# Patient Record
Sex: Female | Born: 1960 | Race: White | Hispanic: No | Marital: Married | Smoking: Never smoker
Health system: Southern US, Community
[De-identification: ages and names within clinical notes are randomized; demographics above are authoritative.]

## PROBLEM LIST (undated history)

## (undated) DIAGNOSIS — K219 Gastro-esophageal reflux disease without esophagitis: Secondary | ICD-10-CM

## (undated) DIAGNOSIS — Z87442 Personal history of urinary calculi: Secondary | ICD-10-CM

## (undated) DIAGNOSIS — R32 Unspecified urinary incontinence: Secondary | ICD-10-CM

## (undated) DIAGNOSIS — C4490 Unspecified malignant neoplasm of skin, unspecified: Secondary | ICD-10-CM

## (undated) DIAGNOSIS — Z86010 Personal history of colonic polyps: Secondary | ICD-10-CM

## (undated) DIAGNOSIS — I959 Hypotension, unspecified: Secondary | ICD-10-CM

## (undated) DIAGNOSIS — Z9889 Other specified postprocedural states: Secondary | ICD-10-CM

## (undated) DIAGNOSIS — Z8719 Personal history of other diseases of the digestive system: Secondary | ICD-10-CM

## (undated) DIAGNOSIS — M8580 Other specified disorders of bone density and structure, unspecified site: Secondary | ICD-10-CM

## (undated) DIAGNOSIS — R112 Nausea with vomiting, unspecified: Secondary | ICD-10-CM

## (undated) DIAGNOSIS — M810 Age-related osteoporosis without current pathological fracture: Secondary | ICD-10-CM

## (undated) DIAGNOSIS — M1990 Unspecified osteoarthritis, unspecified site: Secondary | ICD-10-CM

## (undated) DIAGNOSIS — G43909 Migraine, unspecified, not intractable, without status migrainosus: Secondary | ICD-10-CM

## (undated) HISTORY — PX: OTHER SURGICAL HISTORY: SHX169

## (undated) HISTORY — PX: TUBAL LIGATION: SHX77

## (undated) HISTORY — DX: Gastro-esophageal reflux disease without esophagitis: K21.9

## (undated) HISTORY — DX: Age-related osteoporosis without current pathological fracture: M81.0

## (undated) HISTORY — PX: BREAST SURGERY: SHX581

## (undated) HISTORY — DX: Personal history of urinary calculi: Z87.442

## (undated) HISTORY — PX: BREAST EXCISIONAL BIOPSY: SUR124

## (undated) HISTORY — DX: Unspecified urinary incontinence: R32

## (undated) HISTORY — DX: Other specified disorders of bone density and structure, unspecified site: M85.80

## (undated) HISTORY — PX: SEPTOPLASTY: SUR1290

## (undated) HISTORY — DX: Personal history of colonic polyps: Z86.010

## (undated) HISTORY — PX: COLONOSCOPY: SHX174

## (undated) HISTORY — DX: Migraine, unspecified, not intractable, without status migrainosus: G43.909

---

## 1998-06-27 ENCOUNTER — Ambulatory Visit (HOSPITAL_COMMUNITY): Admission: RE | Admit: 1998-06-27 | Discharge: 1998-06-27 | Payer: Self-pay | Admitting: Obstetrics and Gynecology

## 1998-08-26 ENCOUNTER — Ambulatory Visit (HOSPITAL_COMMUNITY): Admission: RE | Admit: 1998-08-26 | Discharge: 1998-08-26 | Payer: Self-pay | Admitting: *Deleted

## 1998-09-06 ENCOUNTER — Ambulatory Visit (HOSPITAL_COMMUNITY): Admission: RE | Admit: 1998-09-06 | Discharge: 1998-09-06 | Payer: Self-pay | Admitting: *Deleted

## 1998-11-29 ENCOUNTER — Other Ambulatory Visit: Admission: RE | Admit: 1998-11-29 | Discharge: 1998-11-29 | Payer: Self-pay | Admitting: Obstetrics and Gynecology

## 1999-07-05 ENCOUNTER — Encounter: Payer: Self-pay | Admitting: Obstetrics and Gynecology

## 1999-07-05 ENCOUNTER — Ambulatory Visit (HOSPITAL_COMMUNITY): Admission: RE | Admit: 1999-07-05 | Discharge: 1999-07-05 | Payer: Self-pay | Admitting: Obstetrics and Gynecology

## 1999-12-05 ENCOUNTER — Other Ambulatory Visit: Admission: RE | Admit: 1999-12-05 | Discharge: 1999-12-05 | Payer: Self-pay | Admitting: Obstetrics and Gynecology

## 2000-02-19 ENCOUNTER — Emergency Department (HOSPITAL_COMMUNITY): Admission: EM | Admit: 2000-02-19 | Discharge: 2000-02-19 | Payer: Self-pay | Admitting: Emergency Medicine

## 2000-02-20 ENCOUNTER — Encounter: Payer: Self-pay | Admitting: Emergency Medicine

## 2000-02-28 ENCOUNTER — Ambulatory Visit (HOSPITAL_COMMUNITY): Admission: RE | Admit: 2000-02-28 | Discharge: 2000-02-28 | Payer: Self-pay | Admitting: Internal Medicine

## 2000-02-28 ENCOUNTER — Encounter: Payer: Self-pay | Admitting: Internal Medicine

## 2000-03-14 ENCOUNTER — Ambulatory Visit (HOSPITAL_COMMUNITY): Admission: RE | Admit: 2000-03-14 | Discharge: 2000-03-14 | Payer: Self-pay | Admitting: Internal Medicine

## 2000-04-17 ENCOUNTER — Ambulatory Visit (HOSPITAL_COMMUNITY): Admission: RE | Admit: 2000-04-17 | Discharge: 2000-04-17 | Payer: Self-pay | Admitting: Internal Medicine

## 2000-07-08 ENCOUNTER — Ambulatory Visit (HOSPITAL_COMMUNITY): Admission: RE | Admit: 2000-07-08 | Discharge: 2000-07-08 | Payer: Self-pay | Admitting: Obstetrics and Gynecology

## 2000-07-08 ENCOUNTER — Encounter: Payer: Self-pay | Admitting: Obstetrics and Gynecology

## 2001-01-21 ENCOUNTER — Other Ambulatory Visit: Admission: RE | Admit: 2001-01-21 | Discharge: 2001-01-21 | Payer: Self-pay | Admitting: Obstetrics and Gynecology

## 2001-07-09 ENCOUNTER — Encounter: Payer: Self-pay | Admitting: Obstetrics and Gynecology

## 2001-07-09 ENCOUNTER — Ambulatory Visit (HOSPITAL_COMMUNITY): Admission: RE | Admit: 2001-07-09 | Discharge: 2001-07-09 | Payer: Self-pay | Admitting: Obstetrics and Gynecology

## 2002-02-17 ENCOUNTER — Other Ambulatory Visit: Admission: RE | Admit: 2002-02-17 | Discharge: 2002-02-17 | Payer: Self-pay | Admitting: Obstetrics and Gynecology

## 2002-05-08 ENCOUNTER — Ambulatory Visit (HOSPITAL_COMMUNITY): Admission: RE | Admit: 2002-05-08 | Discharge: 2002-05-08 | Payer: Self-pay | Admitting: Obstetrics and Gynecology

## 2002-05-08 ENCOUNTER — Encounter (INDEPENDENT_AMBULATORY_CARE_PROVIDER_SITE_OTHER): Payer: Self-pay

## 2002-07-27 ENCOUNTER — Encounter: Admission: RE | Admit: 2002-07-27 | Discharge: 2002-07-27 | Payer: Self-pay | Admitting: Obstetrics and Gynecology

## 2002-07-27 ENCOUNTER — Encounter: Payer: Self-pay | Admitting: Obstetrics and Gynecology

## 2002-11-05 ENCOUNTER — Encounter (INDEPENDENT_AMBULATORY_CARE_PROVIDER_SITE_OTHER): Payer: Self-pay | Admitting: Specialist

## 2002-11-05 ENCOUNTER — Ambulatory Visit (HOSPITAL_BASED_OUTPATIENT_CLINIC_OR_DEPARTMENT_OTHER): Admission: RE | Admit: 2002-11-05 | Discharge: 2002-11-05 | Payer: Self-pay | Admitting: Plastic Surgery

## 2002-11-18 ENCOUNTER — Ambulatory Visit (HOSPITAL_BASED_OUTPATIENT_CLINIC_OR_DEPARTMENT_OTHER): Admission: RE | Admit: 2002-11-18 | Discharge: 2002-11-18 | Payer: Self-pay | Admitting: Plastic Surgery

## 2002-11-18 ENCOUNTER — Encounter (INDEPENDENT_AMBULATORY_CARE_PROVIDER_SITE_OTHER): Payer: Self-pay | Admitting: Plastic Surgery

## 2003-01-01 ENCOUNTER — Encounter (INDEPENDENT_AMBULATORY_CARE_PROVIDER_SITE_OTHER): Payer: Self-pay | Admitting: *Deleted

## 2003-01-01 ENCOUNTER — Ambulatory Visit (HOSPITAL_BASED_OUTPATIENT_CLINIC_OR_DEPARTMENT_OTHER): Admission: RE | Admit: 2003-01-01 | Discharge: 2003-01-01 | Payer: Self-pay | Admitting: Plastic Surgery

## 2003-03-05 ENCOUNTER — Ambulatory Visit (HOSPITAL_BASED_OUTPATIENT_CLINIC_OR_DEPARTMENT_OTHER): Admission: RE | Admit: 2003-03-05 | Discharge: 2003-03-05 | Payer: Self-pay | Admitting: Plastic Surgery

## 2003-03-05 ENCOUNTER — Encounter (INDEPENDENT_AMBULATORY_CARE_PROVIDER_SITE_OTHER): Payer: Self-pay | Admitting: Specialist

## 2003-03-05 ENCOUNTER — Ambulatory Visit (HOSPITAL_COMMUNITY): Admission: RE | Admit: 2003-03-05 | Discharge: 2003-03-05 | Payer: Self-pay | Admitting: Plastic Surgery

## 2003-04-09 ENCOUNTER — Other Ambulatory Visit: Admission: RE | Admit: 2003-04-09 | Discharge: 2003-04-09 | Payer: Self-pay | Admitting: Obstetrics and Gynecology

## 2003-08-02 ENCOUNTER — Ambulatory Visit (HOSPITAL_COMMUNITY): Admission: RE | Admit: 2003-08-02 | Discharge: 2003-08-02 | Payer: Self-pay | Admitting: Obstetrics and Gynecology

## 2004-01-07 ENCOUNTER — Ambulatory Visit (HOSPITAL_COMMUNITY): Admission: RE | Admit: 2004-01-07 | Discharge: 2004-01-07 | Payer: Self-pay | Admitting: Family Medicine

## 2004-06-04 DIAGNOSIS — Z860100 Personal history of colon polyps, unspecified: Secondary | ICD-10-CM

## 2004-06-04 HISTORY — PX: ABDOMINAL HYSTERECTOMY: SHX81

## 2004-06-04 HISTORY — DX: Personal history of colon polyps, unspecified: Z86.0100

## 2004-06-04 HISTORY — DX: Personal history of colonic polyps: Z86.010

## 2004-06-04 HISTORY — PX: VAGINAL HYSTERECTOMY: SUR661

## 2004-06-19 ENCOUNTER — Other Ambulatory Visit: Admission: RE | Admit: 2004-06-19 | Discharge: 2004-06-19 | Payer: Self-pay | Admitting: Obstetrics and Gynecology

## 2004-08-02 ENCOUNTER — Ambulatory Visit (HOSPITAL_COMMUNITY): Admission: RE | Admit: 2004-08-02 | Discharge: 2004-08-02 | Payer: Self-pay | Admitting: Obstetrics and Gynecology

## 2004-10-09 ENCOUNTER — Ambulatory Visit (HOSPITAL_COMMUNITY): Admission: RE | Admit: 2004-10-09 | Discharge: 2004-10-09 | Payer: Self-pay | Admitting: Gastroenterology

## 2004-10-09 ENCOUNTER — Encounter (INDEPENDENT_AMBULATORY_CARE_PROVIDER_SITE_OTHER): Payer: Self-pay | Admitting: *Deleted

## 2005-07-10 ENCOUNTER — Other Ambulatory Visit: Admission: RE | Admit: 2005-07-10 | Discharge: 2005-07-10 | Payer: Self-pay | Admitting: Obstetrics and Gynecology

## 2005-08-07 ENCOUNTER — Ambulatory Visit (HOSPITAL_COMMUNITY): Admission: RE | Admit: 2005-08-07 | Discharge: 2005-08-07 | Payer: Self-pay | Admitting: Obstetrics and Gynecology

## 2005-08-23 ENCOUNTER — Ambulatory Visit (HOSPITAL_COMMUNITY): Admission: RE | Admit: 2005-08-23 | Discharge: 2005-08-23 | Payer: Self-pay | Admitting: Obstetrics and Gynecology

## 2006-08-12 ENCOUNTER — Ambulatory Visit (HOSPITAL_COMMUNITY): Admission: RE | Admit: 2006-08-12 | Discharge: 2006-08-12 | Payer: Self-pay | Admitting: Obstetrics and Gynecology

## 2006-08-15 ENCOUNTER — Ambulatory Visit: Payer: Self-pay | Admitting: Internal Medicine

## 2006-08-19 ENCOUNTER — Emergency Department (HOSPITAL_COMMUNITY): Admission: EM | Admit: 2006-08-19 | Discharge: 2006-08-19 | Payer: Self-pay | Admitting: Emergency Medicine

## 2006-09-10 ENCOUNTER — Ambulatory Visit (HOSPITAL_COMMUNITY): Admission: RE | Admit: 2006-09-10 | Discharge: 2006-09-10 | Payer: Self-pay | Admitting: Obstetrics and Gynecology

## 2006-09-10 ENCOUNTER — Encounter: Payer: Self-pay | Admitting: Vascular Surgery

## 2006-09-10 ENCOUNTER — Ambulatory Visit: Payer: Self-pay | Admitting: Vascular Surgery

## 2007-05-20 ENCOUNTER — Ambulatory Visit (HOSPITAL_COMMUNITY): Admission: RE | Admit: 2007-05-20 | Discharge: 2007-05-20 | Payer: Self-pay | Admitting: Obstetrics and Gynecology

## 2007-08-19 ENCOUNTER — Ambulatory Visit (HOSPITAL_COMMUNITY): Admission: RE | Admit: 2007-08-19 | Discharge: 2007-08-19 | Payer: Self-pay | Admitting: Obstetrics and Gynecology

## 2008-06-23 ENCOUNTER — Encounter: Payer: Self-pay | Admitting: Internal Medicine

## 2008-08-19 ENCOUNTER — Ambulatory Visit (HOSPITAL_COMMUNITY): Admission: RE | Admit: 2008-08-19 | Discharge: 2008-08-19 | Payer: Self-pay | Admitting: Obstetrics and Gynecology

## 2009-02-17 ENCOUNTER — Emergency Department (HOSPITAL_COMMUNITY): Admission: EM | Admit: 2009-02-17 | Discharge: 2009-02-17 | Payer: Self-pay | Admitting: Emergency Medicine

## 2009-07-14 ENCOUNTER — Encounter: Payer: Self-pay | Admitting: Internal Medicine

## 2009-08-30 ENCOUNTER — Ambulatory Visit (HOSPITAL_COMMUNITY): Admission: RE | Admit: 2009-08-30 | Discharge: 2009-08-30 | Payer: Self-pay | Admitting: Obstetrics and Gynecology

## 2009-11-04 ENCOUNTER — Ambulatory Visit (HOSPITAL_COMMUNITY): Admission: RE | Admit: 2009-11-04 | Discharge: 2009-11-04 | Payer: Self-pay | Admitting: Obstetrics and Gynecology

## 2009-11-15 ENCOUNTER — Encounter: Admission: RE | Admit: 2009-11-15 | Discharge: 2009-11-15 | Payer: Self-pay | Admitting: Obstetrics and Gynecology

## 2009-12-19 ENCOUNTER — Ambulatory Visit (HOSPITAL_COMMUNITY): Admission: RE | Admit: 2009-12-19 | Discharge: 2009-12-19 | Payer: Self-pay | Admitting: Gastroenterology

## 2010-02-27 ENCOUNTER — Encounter: Admission: RE | Admit: 2010-02-27 | Discharge: 2010-02-27 | Payer: Self-pay | Admitting: Obstetrics and Gynecology

## 2010-03-31 LAB — BASIC METABOLIC PANEL
Creatinine: 0.8 mg/dL (ref 0.5–1.1)
Glucose: 67 mg/dL
Potassium: 3.9 mmol/L (ref 3.4–5.3)

## 2010-03-31 LAB — CBC AND DIFFERENTIAL: WBC: 5.3 10^3/mL

## 2010-03-31 LAB — HEPATIC FUNCTION PANEL
ALT: 24 U/L (ref 7–35)
AST: 19 U/L (ref 13–35)

## 2010-03-31 LAB — LIPID PANEL: HDL: 81 mg/dL — AB (ref 35–70)

## 2010-05-23 ENCOUNTER — Ambulatory Visit (HOSPITAL_COMMUNITY)
Admission: RE | Admit: 2010-05-23 | Discharge: 2010-05-23 | Payer: Self-pay | Source: Home / Self Care | Attending: Obstetrics and Gynecology | Admitting: Obstetrics and Gynecology

## 2010-07-04 NOTE — Letter (Signed)
Summary: [facility]  [facility]   Imported By: Lanelle Bal 07/27/2009 11:08:50  _____________________________________________________________________  External Attachment:    Type:   Image     Comment:   External Document

## 2010-08-08 ENCOUNTER — Other Ambulatory Visit (HOSPITAL_COMMUNITY): Payer: Self-pay | Admitting: Orthopedic Surgery

## 2010-08-08 ENCOUNTER — Ambulatory Visit (HOSPITAL_COMMUNITY)
Admission: RE | Admit: 2010-08-08 | Discharge: 2010-08-08 | Disposition: A | Discharge status: Home / Self Care | Payer: 59 | Source: Ambulatory Visit | Attending: Orthopedic Surgery | Admitting: Orthopedic Surgery

## 2010-08-08 DIAGNOSIS — M549 Dorsalgia, unspecified: Secondary | ICD-10-CM

## 2010-08-08 DIAGNOSIS — M545 Low back pain, unspecified: Secondary | ICD-10-CM | POA: Insufficient documentation

## 2010-08-15 ENCOUNTER — Other Ambulatory Visit (HOSPITAL_COMMUNITY): Payer: Self-pay | Admitting: Obstetrics and Gynecology

## 2010-08-15 DIAGNOSIS — Z1231 Encounter for screening mammogram for malignant neoplasm of breast: Secondary | ICD-10-CM

## 2010-08-15 DIAGNOSIS — Q782 Osteopetrosis: Secondary | ICD-10-CM

## 2010-08-31 ENCOUNTER — Ambulatory Visit (HOSPITAL_COMMUNITY)
Admission: RE | Admit: 2010-08-31 | Discharge: 2010-08-31 | Disposition: A | Discharge status: Home / Self Care | Payer: 59 | Source: Ambulatory Visit | Attending: Obstetrics and Gynecology | Admitting: Obstetrics and Gynecology

## 2010-08-31 DIAGNOSIS — Z13820 Encounter for screening for osteoporosis: Secondary | ICD-10-CM | POA: Insufficient documentation

## 2010-08-31 DIAGNOSIS — Z1231 Encounter for screening mammogram for malignant neoplasm of breast: Secondary | ICD-10-CM

## 2010-08-31 DIAGNOSIS — M899 Disorder of bone, unspecified: Secondary | ICD-10-CM | POA: Insufficient documentation

## 2010-10-16 ENCOUNTER — Encounter: Payer: Self-pay | Admitting: Internal Medicine

## 2010-10-16 ENCOUNTER — Ambulatory Visit (INDEPENDENT_AMBULATORY_CARE_PROVIDER_SITE_OTHER): Payer: 59 | Admitting: Internal Medicine

## 2010-10-16 VITALS — BP 110/78 | HR 73 | Temp 98.3°F | Wt 111.4 lb

## 2010-10-16 DIAGNOSIS — M25562 Pain in left knee: Secondary | ICD-10-CM

## 2010-10-16 DIAGNOSIS — M25569 Pain in unspecified knee: Secondary | ICD-10-CM

## 2010-10-16 DIAGNOSIS — M25561 Pain in right knee: Secondary | ICD-10-CM

## 2010-10-16 NOTE — Patient Instructions (Signed)
Assess response to Glucosamine sulfate 1500 mg daily & avoiding stair climbing.

## 2010-10-16 NOTE — Progress Notes (Signed)
  Subjective:    Patient ID: Katherine Vaughan, female    DOB: 1961/01/20, 50 y.o.   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  Deep tendon reflexes are equal. Strength is excellent in upper and lower extremities.  Pedal pulses are intact.  Gait is normal including toe and heel walking. She has no significant skeletal abnormalities of the hands.  There is no definite effusion of the knees. Range of motion of the lower extremities is normal. She has no popliteal area tenderness.  Straight leg raising is negative. Homans sign is negative.       Assessment/plan: Her knee symptoms are suggestive of an arthritic condition rather than a neurologic picture related to her documented spinal stenosis. The symptoms are most likely aggravated by walking she doesn't work climbing flights of stairs repeatedly.  I've recommended glucosamine sulfate 1500  mg 1-2 times daily for the next several weeks & avoiding the stairs. If symptoms fail to resolve I would recommend an assessment by an Orthopedic  knee specialists.

## 2010-10-20 NOTE — H&P (Signed)
NAME:  Katherine Vaughan, Katherine Vaughan                 ACCOUNT NO.:  [id number]   MEDICAL RECORD NO.:  [id number]          PATIENT TYPE:  AMB   LOCATION:  SDC                           FACILITY:  [facility]   PHYSICIAN:  Juluis Mire, M.D.   DATE OF BIRTH:  December 12, 1960   DATE OF ADMISSION:  DATE OF DISCHARGE:                                HISTORY & PHYSICAL   HISTORY OF PRESENT ILLNESS:  The patient is a 50 year old nulligravid  married female who presents for a laparoscopy and evaluation of the vaginal  cuff.   In relation to the present admission, the patient's history from a  gynecological standpoint is significant in that she had a vaginal  hysterectomy in 1996 for management of symptomatic pelvic endometriosis.  Subsequently she developed insertional dyspareunia and due to the findings  of vestibular adenitis underwent a perineoplasty in 2004; she had good  results with that. At the present time, however, she has increasing pain  mainly during arousal. She describes this as an aching discomfort in the  pelvic area at the top of the vagina. This is a very deep discomfort. There  is no pain with intercourse just prior to intercourse. She has been  evaluated by a urologist for interstitial cystitis with negative findings.  She has undergone pelvic [facility] at [facility] without response. In view of this, we are going to proceed with  laparoscopic evaluation to look for any evidence of pelvic pathology and try  to inject the cuff with a combination of steroids and Marcaine to see if we  can get some relief of her discomfort.   ALLERGIES:  In terms of allergies she is allergic to DEMEROL and PERCOCET.   MEDICATIONS:  Nexium, ampicillin and Fosamax.   PAST MEDICAL HISTORY:  She has the usual childhood diseases. No significant  sequelae.   PAST SURGICAL HISTORY:  1.  In 1996 she had diagnostic laparoscopy and bilateral tubal ligation,      treatment of endometriosis.  Subsequently in 1996 she had a total vaginal      hysterectomy.  2.  In 2004 she had a perineoplasty.  3.  She has also had a rhinoplasty.   FAMILY HISTORY:  Noncontributory.   SOCIAL HISTORY:  Reveals no tobacco or alcohol use.   REVIEW OF SYSTEMS:  Noncontributory.   PHYSICAL EXAMINATION:  VITAL SIGNS:  The patient is afebrile with stable  vital signs.  HEENT EXAM:  The patient is normocephalic. Pupils equal, round and reactive  to light and accommodation. Extraocular movements were intact. Sclerae and  conjunctivae were clear. Oropharynx clear.  NECK:  Without thyromegaly.  BREASTS:  No discrete masses.  LUNGS:  Clear.  CARDIAC SYSTEM:  Regular rhythm and rate without murmurs or gallops.  ABDOMINAL EXAM:  Benign; no masses, organomegaly or tenderness.  PELVIC:  Normal external genitalia. Vaginal mucosa clear. Cuff intact. She  has good support and no tenderness noted or masses.  EXTREMITIES:  Trace edema.  NEUROLOGIC EXAM:  Grossly within normal limits.   IMPRESSION:  Pelvic pain with sexual activity,  rule out pelvic pathology.   PLAN:  The patient will undergo diagnostic laparoscopy for evaluation of the  vaginal cuff. Will try to inject the area with Marcaine and dexamethasone  and see if we can get resolution of her discomfort. The risks and benefits  of surgery have been discussed. We have obviously discussed that this may  not help. We have discussed the risks of infection, the risk of hemorrhage  that could require transfusion, the risk of AIDS or hepatitis, the risk of  injury to adjacent organs requiring exploratory surgery, the risk of deep  venous thrombosis and pulmonary embolus. The patient voiced understanding of  the indications and risks.      Juluis Mire, M.D.  Electronically Signed     JSM/MEDQ  D:  08/23/2005  T:  08/23/2005  Job:  [id number]

## 2010-10-20 NOTE — Op Note (Signed)
   NAME:  SORAIYA, AHNER NO.:  [id number]   MEDICAL RECORD NO.:  [id number]                   PATIENT TYPE:  AMB   LOCATION:  DSC                                  FACILITY:  [facility]   PHYSICIAN:  Etter Sjogren, M.D.                  DATE OF BIRTH:  03-27-1961   DATE OF PROCEDURE:  11/05/2002  DATE OF DISCHARGE:                                 OPERATIVE REPORT   PREOPERATIVE DIAGNOSIS:  Multiple lesions of undetermined behavior right  thumb, arm, shoulder, face, neck, left arm.   POSTOPERATIVE DIAGNOSIS:  Multiple lesions of undetermined behavior right  thumb, arm, shoulder, face, neck, left arm with wounds of the shoulder, arm  and face necessitating a layered closure.   PROCEDURE:  1. Excision of multiple lesions of undetermined behavior base of thumb,     forearm, right side of her arm bilateral, right shoulder, right cheek,     bilateral upper eyelids, left temple, and neck.  2. Intermediate wound closure of the extremity, greater than 2.5 cm.  3. Intermediate wound closure neck, greater than 1.0 cm.  4. Intermediate wound closure face, 2.5 cm or less.   SURGEON:  Etter Sjogren, M.D.   ANESTHESIA:  1% Xylocaine with epinephrine plus bicarbonate.   INDICATIONS:  The patient has multiple lesions that are pigmented, changing,  and it is medically necessary to remove these.  The risks were understood by  her including scarring, possibility of further surgery depending up on final  pathology report.  She understood all this and wished to proceed.   DESCRIPTION OF PROCEDURE:  The patient was placed supine on the operating  table.  She was prepped with Betadine and draped with sterile drapes.  The  elliptical incisions were planned.  Successful local anesthesia was  achieved.  The excision was performed.  Layered closures were necessary  where using 5-0 Monocryl interrupted inverted deep sutures and 6-0 simple  suture or 5-0 Prolene simple sutures.   Antibiotic ointment applied.  She  tolerated the procedure well.                                               Etter Sjogren, M.D.    DB/MEDQ  D:  11/05/2002  T:  11/06/2002  Job:  [id number]

## 2010-10-20 NOTE — H&P (Signed)
NAME:  Katherine Vaughan, Katherine Vaughan NO.:  [id number]   MEDICAL RECORD NO.:  [id number]                   PATIENT TYPE:  AMB   LOCATION:  SDC                                  FACILITY:  [facility]   PHYSICIAN:  Juluis Mire, M.D.                DATE OF BIRTH:  10/08/1960   DATE OF ADMISSION:  05/08/2002  DATE OF DISCHARGE:                                HISTORY & PHYSICAL   REASON FOR ADMISSION:  The patient is a 50 year old, nulligravida, married  white female presents for perineoplasty.   HISTORY OF PRESENT ILLNESS:  The patient has had a long-standing chronic  history of insertional dyspareunia.  Close evaluation has revealed evidence  of a vestibular adenitis.  She has been treated with topical agents  including Temovate as well as estrogen creams without significant response.  This had become an extremely limiting issue.  We had offered the option of  perineoplasty.  We had discussed the success rate of approximately 80%.  She  is admitted at the present time for this.   ALLERGIES:  SULFA.   MEDICATIONS:  1. Prilosec 20 mg twice a day.  2. Minocin 100 mg each day.  3. Ditropan 10 mg each day.   PAST MEDICAL HISTORY:  Usual childhood diseases without any significant  sequelae.   PAST SURGICAL HISTORY:  She had laparoscopic tubal ligation done in the past  with finding of pelvic endometriosis, subsequently had a total vaginal  hysterectomy for management of symptomatic pelvic endometriosis.   FAMILY HISTORY:  Noncontributory.   SOCIAL HISTORY:  No tobacco or alcohol use.   REVIEW OF SYSTEMS:  Noncontributory.   PHYSICAL EXAMINATION:  VITAL SIGNS:  Afebrile with stable vital signs.  HEENT:  The patient is normocephalic.  Pupils equal, round, reactive to  light and accommodation.  Extraocular movements intact.  Sclerae and  conjunctivae are clear.  Oropharynx clear.  NECK:  Without thyromegaly.  BREASTS:  No discrete masses.  LUNGS:  Clear.  CARDIOVASCULAR:  Regular rhythm and rate without murmurs or gallops.  ABDOMEN:  Benign, no masses, organomegaly, or tenderness.  PELVIC:  Does have classical findings of vestibular adenitis.  She has  pinpoint tenderness in the vestibular glands with erythematous changes.  This does not extend up to the periurethral area.  It seems to be located  mostly on the posterior vestibular area.  Vaginal mucosa is otherwise clear,  cuff intact.  Bimanual exam unremarkable.  Rectovaginal exam is clear.  EXTREMITIES:  Trace edema.  NEUROLOGICAL:  Grossly within normal limits.   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   PLAN:  The patient to undergo a perineoplasty.  The risks of surgery have  been discussed including the risks of infection.  The risk of hematoma or  bleeding problems that could require transfusion. The risk of injury to  adjacent organs.  Finally, we  have discussed that the potential of this may  not improve her condition and in fact may cause vaginal scarring leading to  worsening symptomatology.  The patient appears to understand the indications  and risks.                                               Juluis Mire, M.D.    JSM/MEDQ  D:  05/08/2002  T:  05/08/2002  Job:  [id number]

## 2010-10-20 NOTE — Op Note (Signed)
   NAME:  Katherine Vaughan, Katherine Vaughan                           ACCOUNT NO.:  [id number]   MEDICAL RECORD NO.:  [id number]                   PATIENT TYPE:  AMB   LOCATION:  SDC                                  FACILITY:  [facility]   PHYSICIAN:  Juluis Mire, M.D.                DATE OF BIRTH:  1960/12/25   DATE OF PROCEDURE:  05/08/2002  DATE OF DISCHARGE:                                 OPERATIVE REPORT   PREOPERATIVE DIAGNOSIS:  Vestibular adenitis.   POSTOPERATIVE DIAGNOSIS:  Vestibular adenitis.   PROCEDURE:  Perineoplasty.   ANESTHESIA:  General using the laryngeal mask.   ESTIMATED BLOOD LOSS:  100 cc.   PACKS AND DRAINS:  None.   BLOOD REPLACED:  None.   COMPLICATIONS:  None.   INDICATIONS:  As noted in the history and physical.   DESCRIPTION OF PROCEDURE:  The patient was taken to the OR and placed in the  supine position.  After an adequate level of general anesthesia obtained,  the patient was placed in the dorsal lithotomy position using Allen  stirrups.  The perineum and vagina were prepped out with Betadine and draped  in a sterile field.  Exam revealed erythematous areas on the posterior  vestibular area at the area of the vestibular glands.  We first using a  knife made an incision over the perineal body, and we extended this to the  vaginal mucosa side of the hymenal remnant, excising the perineal area,  including the vestibular glands.  This extended up to the periurethral area.  We slightly undermined the vagina.  We brought about hemostasis using the  Bovie.  We excised further some of the skin over the perineum.  We then  reconstructed the area with interrupted sutures of 3-0 Vicryl, completely  bringing the vaginal mucosa out to the perineal body.  We had good  hemostasis, good reapproximation.  Exam revealed again fairly good relief.  There were no constricting areas.  The patient was taken out of the lateral  decubitus position and once alert and extubated, transferred  to the recovery  room in good condition.  Sponge, instrument, and needle count reported as  correct by the circulating nurse.                                                Juluis Mire, M.D.    JSM/MEDQ  D:  05/08/2002  T:  05/08/2002  Job:  [id number]

## 2010-10-20 NOTE — Op Note (Signed)
NAME:  Katherine Vaughan, Katherine Vaughan                           ACCOUNT NO.:  [id number]   MEDICAL RECORD NO.:  [id number]                   PATIENT TYPE:  AMB   LOCATION:  DSC                                  FACILITY:  [facility]   PHYSICIAN:  Etter Sjogren, M.D.                  DATE OF BIRTH:  June 03, 1961   DATE OF PROCEDURE:  11/18/2002  DATE OF DISCHARGE:                                 OPERATIVE REPORT   PREOPERATIVE DIAGNOSES:  1. Lesion of undetermined behavior, base of thumb, possible melanoma in     situ, previously excised.  2. Suspicious lesion of undetermined behavior, left lower abdomen.  3. Lesion of undetermined behavior, left inner thigh.   POSTOPERATIVE DIAGNOSES:  1. Lesion of undetermined behavior, base of thumb, possible melanoma in     situ, previously excised.  2. Suspicious lesion of undetermined behavior, left lower abdomen.  3. Lesion of undetermined behavior, left inner thigh.  4. Open wounds, thigh, abdomen, and base of thumb, secondary to excisions of     lesions of undetermined behavior.   PROCEDURES:  1. Excision of lesion of undetermined behavior of the thumb, possibly     melanoma in situ.  2. Excision of suspicious lesion of undetermined behavior, abdomen.  3. Excision of lesion of lesion of undetermined behavior, left inner thigh.  4. Intermediate wound closures, abdomen, thigh, and base of thumb, totalling     greater than 2.5 cm.   SURGEON:  Etter Sjogren, M.D.   ANESTHESIA:  1% Xylocaine with epinephrine plus bicarbonate.   CLINICAL NOTE:  A 50 year old woman with lesions of all of these various  areas, including base of thumb, abdomen, left thigh.  Base of thumb has been  previously excised.  This was questionable for possible melanoma in situ.  A  wider excision was recommended by the pathologist.  The other lesions are  pigmented with irregular borders, and it is medically necessary to excise  them.  The nature of the procedure and the risks, and she  understood the  possibility of further surgery was dependent upon the final pathology report  and wished to proceed.   DESCRIPTION OF PROCEDURE:  The patient was placed supine.  She was prepped  with Betadine and draped with sterile drapes.  Satisfactory local anesthesia  achieved.  Elliptical excision was performed.  The wounds were irrigated  thoroughly and closed in layers using 5-0 Monocryl interrupted inverted deep  sutures, 5-0 Monocryl running subcuticular suture.  For the thumb, 5-0  Monocryl interrupted inverted deep and 4-0 Prolene simple interrupted  sutures for the skin.  Steri-Strips, dry sterile dressings applied.  Tolerated it well.  Recheck in the office next week.  Etter Sjogren, M.D.    DB/MEDQ  D:  11/18/2002  T:  11/19/2002  Job:  [id number]

## 2010-10-20 NOTE — Op Note (Signed)
NAMEJAYLEAH, Katherine Vaughan                 ACCOUNT NO.:  [id number]   MEDICAL RECORD NO.:  [id number]          PATIENT TYPE:  AMB   LOCATION:  ENDO                         FACILITY:  [facility]   PHYSICIAN:  Petra Kuba, M.D.    DATE OF BIRTH:  1960-10-29   DATE OF PROCEDURE:  10/09/2004  DATE OF DISCHARGE:                                 OPERATIVE REPORT   PROCEDURE:  Colonoscopy with polypectomy.   ENDOSCOPIST:  Petra Kuba, M.D.   INDICATION:  The patient with a family history of colon polyps, probable  IBS.  Consent was signed after risks, benefits, methods, and options were  thoroughly discussed in the office.   MEDICINES USED:  Fentanyl 100 mcg, Versed 7 mg.   DESCRIPTION OF PROCEDURE:  Rectal inspection was pertinent for external  hemorrhoids, small.  Digital exam was negative.  The video pediatric  adjustable colonoscope was inserted and with some difficulty due to a  tortuous looping left side of colon, one passed this area which required  abdominal pressure and rolling her on her back, we were easily able to  advance to the cecum.  Other than a rare  left-sided diverticula, no  abnormalities were seen on insertion.  The cecum was identified by the  appendiceal orifice and the ileocecal valve.  The scope was inserted into  the end of he terminal ileum.  Quick evaluation and photo documentation were  normal.  The scope was slowly withdrawn.  The prep was fairly adequate.  There was some liquid stool that required washing and suctioning.  On slow  withdrawal through the colon, the cecum, ascending, and transverse were  normal.  As the scope was withdrawn around the left side of the colon, in  the distal descending, a questionable tiny polyp was seen and was cold  biopsied x2 and put in the first container.  In the more proximal sigmoid, a  small pedunculated polyp was seen, snared, electrocautery applied, and the  polyp was suctioned through the scope and collected in the trap.   The scope  was further withdrawn.  In the distal descending, another questionable polyp  was seen and hot biopsied x1, and put in the third container.  Other than  the rare left-sided diverticula, no other abnormalities were seen as we  slowly withdrew back to the rectum.  Anorectal pull through in retroflexion  confirmed some small hemorrhoids.  The scope was straightened and re-  advanced a short ways up the left side of the colon.  Air was suctioned and  the scope removed.  The patient tolerated the procedure well.  There was no  obvious immediate complication.   ENDOSCOPIC DIAGNOSES:  1.  Internal and external hemorrhoids.  2.  Rare left-sided diverticula and tortuosity.  3.  Distal sigmoid small polyp hot biopsied.  4.  Mid-to-proximal sigmoid small pedunculated polyp, snared.  5.  Distal descending questionable polyp cold biopsied.  6.  Otherwise within normal limits to the cecum.   PLAN:  Happy to see back p.r.n., await pathology to determine future colonic  screening.  MEM/MEDQ  D:  10/09/2004  T:  10/09/2004  Job:  [id number]

## 2010-10-20 NOTE — Op Note (Signed)
NAME:  Katherine Vaughan, Katherine Vaughan                           ACCOUNT NO.:  [id number]   MEDICAL RECORD NO.:  [id number]                   PATIENT TYPE:  AMB   LOCATION:  DSC                                  FACILITY:  [facility]   PHYSICIAN:  Etter Sjogren, M.D.                  DATE OF BIRTH:  [birth year]   DATE OF PROCEDURE:  03/05/2003  DATE OF DISCHARGE:                                 OPERATIVE REPORT   PREOPERATIVE DIAGNOSIS:  Multiple lesions pigmented with irregular borders  various areas of the body of undetermined behavior, total of 18.   POSTOPERATIVE DIAGNOSIS:  1. Multiple lesions pigmented with irregular borders various areas of the     body of undetermined behavior, total of 18.  2. Intermediate wounds of various areas including breasts, abdomen, hip,     totaling in length greater than 0.5 cm.   PROCEDURE:  1. Intermediate wound closures greater than 7.5 cm in total length.  2. Excision of multiple lesions of the body of undetermined behavior,     abdomen x 2, 0.5 cm, abdomen greater than 1 cm, right arm x 2 0.5 cm,     right neck x 2 0.5 cm, right breast greater than 0.5 cm, left neck x 2     0.5 cm, left arm x 1 0.5 cm, left hip x 1 0.5 cm, right posterior thigh x     1 less than 0.5 cm, and five punch biopsies of the posterior thigh.   SURGEON:  Etter Sjogren, M.D.   ANESTHESIA:  1% Xylocaine with epinephrine plus bicarb.   CLINICAL NOTE:  This 50 year old woman has multiple pigmented lesions with  irregular borders.  She has had a significant number of dysplastic nevi  removed, some of which have had moderate to severe atypia.  It is medically  necessary to remove these.  The risks were well understood by her.  She has  had multiple excisions in the past in order to try to remove these lesions  of undetermined behavior.  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  She wishes to proceed.   DESCRIPTION OF PROCEDURE:  The patient was taken to the operating room and  placed  supine.  She was prepped with Betadine and draped with sterile  drapes.  Local anesthesia was achieved and multiple excisions were  performed.  Closures were performed with 4-0 Monocryl interrupted deep  sutures for the wounds of the right breast, abdomen, left hip, and several  on the neck.  The total length of these wound closures was greater than 0.5  cm.  5-0 Prolene simple interrupted sutures.  Some of the trunk lesions were  closed with 4-0 Monocryl running subcuticular suture.  She was then turned  to the left lateral decubitus and the posterior thigh lesions were  anesthetized and five of them were removed with punch biopsy, one of them  with an excision.  Closure was with 5-0 Prolene simple interrupted sutures.  All the wounds were copiously irrigated prior to closure.  Next, hemostasis  was confirmed.  Antibiotic ointment and dry, sterile dressing was applied.  She tolerated this well.    DISPOSITION:  We will see her back next week in the office for recheck,  suture removal, and discuss pathology report.                                               Etter Sjogren, M.D.    DB/MEDQ  D:  03/05/2003  T:  03/05/2003  Job:  [id number]

## 2010-10-20 NOTE — Op Note (Signed)
NAME:  Katherine Vaughan, Katherine Vaughan                           ACCOUNT NO.:  [id number]   MEDICAL RECORD NO.:  [id number]                   PATIENT TYPE:  AMB   LOCATION:  DSC                                  FACILITY:  [facility]   PHYSICIAN:  Etter Sjogren, M.D.                  DATE OF BIRTH:  1961/03/18   DATE OF PROCEDURE:  01/01/2003  DATE OF DISCHARGE:                                 OPERATIVE REPORT   PREOPERATIVE DIAGNOSES:  1. Multiple lesions, 12 in number, of the back of undetermined behavior.  2. Three lesions of the left thigh of undetermined behavior.   POSTOPERATIVE DIAGNOSIS:  1. Multiple lesions, 12 in number, of the back of undetermined behavior.  2. Three lesions of the left thigh of undetermined behavior.  3. Complicated open wounds greater than 15 cm in total length, multiple     areas thigh and back secondary to excision of lesions of undetermined     behavior.   PROCEDURES PERFORMED:  1. Excision lesion of undetermined behavior of back greater than 1.0 cm.  2. Excision of lesion of the back x2 greater than 0.5 cm.  3. Excision of lesions of the back less than 0.5 cm of undetermined     behavior, a total of 9.  4. Excision of lesions of left lateral thigh, less than 0.5 cm, a total of     3.  5. Complex wound closures of back and lateral thigh, total length of all     greater than 15 cm.   SURGEON:  Etter Sjogren, M.D.   ANESTHESIA:  1% Xylocaine with epinephrine plus bicarbonate.   INDICATIONS FOR PROCEDURE:  The patient is a 50 year old woman who has had  multiple skin lesions excised in the past. 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  She presents with more suspicious lesions that warrant excision. The nature  of the procedure and the risks were understood by her including the possible  need for further surgery pending the pathology result. She understood this,  scarring and possibly wound healing  problems and wished to proceed.   DESCRIPTION OF PROCEDURE:  The patient was placed  initially in the right  lateral decubitus position. She was prepped and draped with sterile drapes.   The lateral thigh wound lesions were addressed  initially with local  anesthesia and then elliptical incisions. The wounds were irrigated  thoroughly and layered closures with 4-0 Monocryl and 5-0 Monocryl  interrupted inverted deep sutures and 4-0 Monocryl for the subcuticular  suture. A dry sterile dressing was applied.   The patient was then placed prone. The lesions on the back were then  prepped. She was draped with sterile drapes. Successful local anesthesia was  achieved and the multiple elliptical incisions were performed taking a  margin of normal tissue around these  lesions in order to try to ensure  margins. The wounds were irrigated thoroughly and  again layered closures with 4-0 Monocryl interrupted inverted deep sutures  and 4-0 Monocryl running subcuticular suture. Steri-Strips and dry sterile  dressings were applied. She tolerated  the procedure well.   DISPOSITION:  It is OK to shower tomorrow night. Return to the office in  about 10 days for wound check.                                               Etter Sjogren, M.D.    DB/MEDQ  D:  01/01/2003  T:  01/02/2003  Job:  [id number]

## 2010-10-20 NOTE — Op Note (Signed)
Katherine, Vaughan                 ACCOUNT NO.:  [id number]   MEDICAL RECORD NO.:  [id number]          PATIENT TYPE:  AMB   LOCATION:  SDC                           FACILITY:  [facility]   PHYSICIAN:  Juluis Mire, M.D.   DATE OF BIRTH:  03/04/1961   DATE OF PROCEDURE:  08/23/2005  DATE OF DISCHARGE:                                 OPERATIVE REPORT   PREOPERATIVE DIAGNOSIS:  Dyspareunia/pelvic pain.   POSTOPERATIVE DIAGNOSIS:  Dyspareunia/pelvic pain.   OPERATION/PROCEDURE:  1.  Open laparoscopy.  2.  Lysis of adhesions.  3.  Cautery of endometriotic implants.  4.  Injection of vaginal cuff with a mixture of dexamethasone and 0.5%      Marcaine.   SURGEON:  Juluis Mire, M.D.   ANESTHESIA:  General endotracheal.   ESTIMATED BLOOD LOSS:  Minimal.   PACKS AND DRAINS:  None.   INTRAOPERATIVE BLOOD REPLACED:  None.   COMPLICATIONS:  None.   INDICATIONS:  Dictated in history and physical.   DESCRIPTION OF PROCEDURE:  The patient was taken to the OR, placed in the  supine position.  After satisfactory level of general endotracheal  anesthesia was obtained, the patient placed in the dorsal lithotomy position  using the Allen stirrups.  The abdomen, vagina and perineum were prepped out  with Betadine.  The patient's bladder was emptied by in-and-out  catheterization.  The patient was then draped as a sterile field.  A  subumbilical incision made with the knife, carried to the subcutaneous  tissue. Fascia was entered sharply and incision in the fascia extended  laterally.  Rectus muscle were then separated.  The peritoneum was  identified and held up with a Tresa Endo and the peritoneum was entered sharply.  A Taut open laparoscopic trocar was inserted and secured. Laparoscope was  introduced.  There was no evidence of injury to adjacent organs.  A 5 mm  trocar was put in place in the suprapubic area under direct visualization.  Visualization revealed some adhesions from the appendix,  small intestine to  the right pelvic sidewall. This was taken down easily using sharp  dissection. We used the Gyrus bipolar for hemostasis.  There was no entry  into the small intestine and the appendix stayed intact.  We then noticed  some adhesions between the left pelvic sidewall and the small intestine.  This was also taken down but we used cautery incision.  We had good  hemostasis and note evidence of injury to the bowel.  At this point in time  both ovaries were easily visualized and noted to be free away from the  vaginal cuff.  She had one implanted endometriosis on the left side that we  cauterized.  At this point in time we had good hemostasis.  We then went  vaginally where we injected approximately 20 mL of a mixture of  dexamethasone and 0.5% Marcaine into the vaginal cuff.  After this we had  good hemostasis.  We revisualized the pelvic cavity. Again we had good  hemostasis. No evidence of abnormalities.  At this point in time,  the  abdomen was deflated of is carbon dioxide.  All trocars were removed.  The  subumbilical fascia was closed with two figure-of-eights and 0 Vicryl.  Skin  with interrupted subcuticulars of 4-0 Vicryl.  Suprapubic incision closed  with Dermabond.  Sponge, instrument and needle counts reported as correct by  the circulating nurse x2. The patient was taken out of the dorsal lithotomy  position and once alert and extubated, was transferred to the recovery room  in good condition.      Juluis Mire, M.D.  Electronically Signed     JSM/MEDQ  D:  08/23/2005  T:  08/24/2005  Job:  [id number]

## 2010-12-28 ENCOUNTER — Other Ambulatory Visit: Payer: Self-pay | Admitting: Obstetrics and Gynecology

## 2010-12-28 DIAGNOSIS — N63 Unspecified lump in unspecified breast: Secondary | ICD-10-CM

## 2011-01-03 ENCOUNTER — Other Ambulatory Visit: Payer: 59

## 2011-01-11 ENCOUNTER — Ambulatory Visit
Admission: RE | Admit: 2011-01-11 | Discharge: 2011-01-11 | Disposition: A | Discharge status: Home / Self Care | Payer: 59 | Source: Ambulatory Visit | Attending: Obstetrics and Gynecology | Admitting: Obstetrics and Gynecology

## 2011-01-11 DIAGNOSIS — N63 Unspecified lump in unspecified breast: Secondary | ICD-10-CM

## 2011-04-18 ENCOUNTER — Other Ambulatory Visit (HOSPITAL_COMMUNITY): Payer: Self-pay | Admitting: Obstetrics and Gynecology

## 2011-04-30 ENCOUNTER — Other Ambulatory Visit: Payer: Self-pay | Admitting: Obstetrics and Gynecology

## 2011-04-30 DIAGNOSIS — Z1231 Encounter for screening mammogram for malignant neoplasm of breast: Secondary | ICD-10-CM

## 2011-05-03 ENCOUNTER — Other Ambulatory Visit (HOSPITAL_COMMUNITY): Payer: Self-pay | Admitting: Obstetrics and Gynecology

## 2011-05-03 DIAGNOSIS — Z803 Family history of malignant neoplasm of breast: Secondary | ICD-10-CM

## 2011-05-15 ENCOUNTER — Ambulatory Visit
Admission: RE | Admit: 2011-05-15 | Discharge: 2011-05-15 | Disposition: A | Discharge status: Home / Self Care | Payer: 59 | Source: Ambulatory Visit | Attending: Obstetrics and Gynecology | Admitting: Obstetrics and Gynecology

## 2011-05-15 DIAGNOSIS — Z1231 Encounter for screening mammogram for malignant neoplasm of breast: Secondary | ICD-10-CM

## 2011-05-16 ENCOUNTER — Ambulatory Visit (HOSPITAL_COMMUNITY)
Admission: RE | Admit: 2011-05-16 | Discharge: 2011-05-16 | Disposition: A | Discharge status: Home / Self Care | Payer: 59 | Source: Ambulatory Visit | Attending: Obstetrics and Gynecology | Admitting: Obstetrics and Gynecology

## 2011-05-16 DIAGNOSIS — Z803 Family history of malignant neoplasm of breast: Secondary | ICD-10-CM | POA: Insufficient documentation

## 2011-05-16 MED ORDER — GADOBENATE DIMEGLUMINE 529 MG/ML IV SOLN
10.0000 mL | Freq: Once | INTRAVENOUS | Status: AC | PRN
  Administered 2011-05-16: 10 mL via INTRAVENOUS

## 2011-06-06 ENCOUNTER — Telehealth: Payer: Self-pay | Admitting: *Deleted

## 2011-06-06 DIAGNOSIS — Z0000 Encounter for general adult medical examination without abnormal findings: Secondary | ICD-10-CM

## 2011-06-06 NOTE — Telephone Encounter (Signed)
Message copied by Deatra James on Wed Jun 06, 2011  8:18 AM ------      Message from: Earl Lagos      Created: Mon Jun 04, 2011  1:35 PM      Regarding: labs       Please schedule labs for new pt cpx 07/04/11.  Thanks

## 2011-06-06 NOTE — Telephone Encounter (Signed)
Recieved staff msg pt schedule cpx 07/04/11 need labs in EPIC. Entered....06/06/11@8 :19am

## 2011-07-04 ENCOUNTER — Encounter: Payer: Self-pay | Admitting: Internal Medicine

## 2011-07-04 ENCOUNTER — Telehealth: Payer: Self-pay | Admitting: *Deleted

## 2011-07-04 ENCOUNTER — Other Ambulatory Visit (INDEPENDENT_AMBULATORY_CARE_PROVIDER_SITE_OTHER): Payer: 59

## 2011-07-04 ENCOUNTER — Ambulatory Visit (INDEPENDENT_AMBULATORY_CARE_PROVIDER_SITE_OTHER): Payer: 59 | Admitting: Internal Medicine

## 2011-07-04 VITALS — BP 100/68 | HR 75 | Temp 97.4°F | Ht 63.25 in | Wt 112.8 lb

## 2011-07-04 DIAGNOSIS — M8580 Other specified disorders of bone density and structure, unspecified site: Secondary | ICD-10-CM | POA: Insufficient documentation

## 2011-07-04 DIAGNOSIS — E559 Vitamin D deficiency, unspecified: Secondary | ICD-10-CM | POA: Insufficient documentation

## 2011-07-04 DIAGNOSIS — M949 Disorder of cartilage, unspecified: Secondary | ICD-10-CM

## 2011-07-04 DIAGNOSIS — M810 Age-related osteoporosis without current pathological fracture: Secondary | ICD-10-CM | POA: Insufficient documentation

## 2011-07-04 DIAGNOSIS — Z0000 Encounter for general adult medical examination without abnormal findings: Secondary | ICD-10-CM

## 2011-07-04 DIAGNOSIS — M899 Disorder of bone, unspecified: Secondary | ICD-10-CM

## 2011-07-04 DIAGNOSIS — K219 Gastro-esophageal reflux disease without esophagitis: Secondary | ICD-10-CM | POA: Insufficient documentation

## 2011-07-04 LAB — URINALYSIS, ROUTINE W REFLEX MICROSCOPIC
Bilirubin Urine: NEGATIVE
Hgb urine dipstick: NEGATIVE
Ketones, ur: NEGATIVE
Total Protein, Urine: NEGATIVE
Urine Glucose: NEGATIVE

## 2011-07-04 LAB — CBC WITH DIFFERENTIAL/PLATELET
Basophils Absolute: 0 10*3/uL (ref 0.0–0.1)
Eosinophils Relative: 1 % (ref 0.0–5.0)
Hemoglobin: 12.4 g/dL (ref 12.0–15.0)
Lymphocytes Relative: 36.3 % (ref 12.0–46.0)
Monocytes Relative: 7.1 % (ref 3.0–12.0)
Neutro Abs: 2.7 10*3/uL (ref 1.4–7.7)
Platelets: 126 10*3/uL — ABNORMAL LOW (ref 150.0–400.0)
RDW: 14.1 % (ref 11.5–14.6)
WBC: 4.9 10*3/uL (ref 4.5–10.5)

## 2011-07-04 NOTE — Assessment & Plan Note (Signed)
Prior osteoporosis when 1st screened in 2004 -  S/p 5 years Fosamax 2005-2010 - mgmt by gyn Last 2 DEXA improved: now osteopenia Continue Vit D (check level today - see next) and Ca + WB exercise

## 2011-07-04 NOTE — Patient Instructions (Signed)
It was good to see you today. We have reviewed your prior records including labs and tests today Test(s) ordered today. Your results will be called to you after review (48-72hours after test completion). If any changes need to be made, you will be notified at that time. Health Maintenance reviewed - everything is up to date! Medications reviewed, no changes at this time. Please schedule followup annually for physical and labs call sooner if problems.

## 2011-07-04 NOTE — Assessment & Plan Note (Signed)
Well controlled with diet discretion and Nexium Follows with GI as needed

## 2011-07-04 NOTE — Assessment & Plan Note (Signed)
Hx defic with gyn screening - takes OTC supplements Check level today 

## 2011-07-04 NOTE — Progress Notes (Signed)
Subjective:    Patient ID: Katherine Vaughan, female    DOB: 12-18-1960, 51 y.o.   MRN: [id number]  HPI New pt to me but known to our practice, here to establish care (prev followed with Alwyn Ren) patient is here today for annual physical. Patient feels well and has no complaints.  Also reviewed chronic medical issues: Osteopenia. Works with gynecology on same. Previously osteoporosis level when first diagnosed in 2004. Took bisphosphonate x5 years, stopped same in 2010 because of improvement on DEXA. Participate in weightbearing exercises and takes over-the-counter vitamin D plus calcium  Vitamin D deficiency. Prior diagnosis determined by gynecology. Has taken over-the-counter medications but requests level to be checked  GERD. History of severe symptoms but controlled with dietary discretion, avoidances and daily Nexium. Works with GI as needed  Past Medical History  Diagnosis Date  . GERD (gastroesophageal reflux disease)   . History of kidney stones   . Migraines   . History of colon polyps 2006  . Urine incontinence   . Osteopenia     dexa 08/2010: -2.3 L fem   Family History  Problem Relation Age of Onset  . Arthritis Mother   . Arthritis Father   . Diabetes Other   . Heart disease Other   . Hyperlipidemia Other   . Hypertension Other   . Stroke Other    History  Substance Use Topics  . Smoking status: Never Smoker   . Smokeless tobacco: Not on file  . Alcohol Use: No    Review of Systems Constitutional: Negative for fever or weight change.  Respiratory: Negative for cough and shortness of breath.   Cardiovascular: Negative for chest pain or palpitations.  Gastrointestinal: Negative for abdominal pain, no bowel changes.  Musculoskeletal: Negative for gait problem or joint swelling.  Skin: Negative for rash.  Neurological: Negative for dizziness or headache.  No other specific complaints in a complete review of systems (except as listed in HPI above).       Objective:   Physical Exam BP 100/68  Pulse 75  Temp(Src) 97.4 F (36.3 C) (Oral)  Ht 5' 3.25" (1.607 m)  Wt 112 lb 12.8 oz (51.166 kg)  BMI 19.82 kg/m2  SpO2 99% Wt Readings from Last 3 Encounters:  07/04/11 112 lb 12.8 oz (51.166 kg)  10/16/10 111 lb 6.4 oz (50.531 kg)   Constitutional: She is thin/fit. appears well-developed and well-nourished. No distress.  HENT: Head: Normocephalic and atraumatic. Ears: B TMs ok, no erythema or effusion; Nose: Nose normal.  Mouth/Throat: Oropharynx is clear and moist. No oropharyngeal exudate.  Eyes: Conjunctivae and EOM are normal. Pupils are equal, round, and reactive to light. No scleral icterus.  Neck: Normal range of motion. Neck supple. No JVD present. No thyromegaly present.  Cardiovascular: Normal rate, regular rhythm and normal heart sounds.  No murmur heard. No BLE edema. Pulmonary/Chest: Effort normal and breath sounds normal. No respiratory distress. She has no wheezes.  Abdominal: Soft. Bowel sounds are normal. She exhibits no distension. There is no tenderness. no masses Musculoskeletal: Normal range of motion, no joint effusions. No gross deformities Neurological: She is alert and oriented to person, place, and time. No cranial nerve deficit. Coordination normal.  Skin: Skin is warm and dry. No rash noted. No erythema.  Psychiatric: She has a normal mood and affect. Her behavior is normal. Judgment and thought content normal.   No results found for this basename: WBC, HGB, HCT, PLT, GLUCOSE, CHOL, TRIG, HDL, LDLDIRECT, LDLCALC, ALT, AST, NA,  K, CL, CREATININE, BUN, CO2, TSH, PSA, INR, GLUF, HGBA1C, MICROALBUR   EKG: sinus 68 bpm - no ischemic change     Assessment & Plan:  CPX - v70.0 - Patient has been counseled on age-appropriate routine health concerns for screening and prevention. These are reviewed and up-to-date. Immunizations are up-to-date or declined. Labs ordered and ECG reviewed.  Also See problem list. Medications  and labs reviewed today.

## 2011-07-04 NOTE — Telephone Encounter (Signed)
Message copied by Deatra James on Wed Jul 04, 2011  5:17 PM ------      Message from: COUSIN, SHARON T      Created: Wed Jul 04, 2011  4:09 PM      Regarding: 07/07/12  PHY DATE       Lynford Humphrey

## 2011-07-05 LAB — LIPID PANEL
LDL Cholesterol: 69 mg/dL (ref 0–99)
Total CHOL/HDL Ratio: 2
Triglycerides: 22 mg/dL (ref 0.0–149.0)

## 2011-07-05 LAB — BASIC METABOLIC PANEL
CO2: 30 mEq/L (ref 19–32)
Chloride: 104 mEq/L (ref 96–112)
Sodium: 140 mEq/L (ref 135–145)

## 2011-07-05 LAB — HEPATIC FUNCTION PANEL
ALT: 21 U/L (ref 0–35)
Alkaline Phosphatase: 52 U/L (ref 39–117)
Bilirubin, Direct: 0.1 mg/dL (ref 0.0–0.3)
Total Protein: 6.5 g/dL (ref 6.0–8.3)

## 2011-10-23 ENCOUNTER — Telehealth: Payer: Self-pay | Admitting: Internal Medicine

## 2011-10-23 NOTE — Telephone Encounter (Signed)
Pt c/o urinary frequency, pressure onset 10/22/11. Emergent sx r/o. Appt sched for 10/24/11 @ 1115 with Dr. Felicity Coyer. Call back parameters reviewed.

## 2011-10-23 NOTE — Telephone Encounter (Signed)
Noted, agree thanks

## 2011-10-24 ENCOUNTER — Ambulatory Visit: Payer: 59 | Admitting: Internal Medicine

## 2011-10-24 ENCOUNTER — Ambulatory Visit (INDEPENDENT_AMBULATORY_CARE_PROVIDER_SITE_OTHER): Payer: 59 | Admitting: Internal Medicine

## 2011-10-24 ENCOUNTER — Encounter: Payer: Self-pay | Admitting: Internal Medicine

## 2011-10-24 VITALS — HR 67 | Temp 98.4°F | Ht 63.25 in

## 2011-10-24 DIAGNOSIS — N390 Urinary tract infection, site not specified: Secondary | ICD-10-CM

## 2011-10-24 LAB — POCT URINALYSIS DIPSTICK
Ketones, UA: NEGATIVE
Protein, UA: NEGATIVE
pH, UA: 5

## 2011-10-24 MED ORDER — CIPROFLOXACIN HCL 500 MG PO TABS: 500.0000 mg | ORAL_TABLET | Freq: Two times a day (BID) | ORAL | Status: AC

## 2011-10-24 NOTE — Progress Notes (Signed)
HPI: complains of UTI symptoms Onset 3-4 days ago, progressively worse associated with dysuria and small volume voiding with increased frequency denies hematuria, flank pain or fever The patient has a history of prior UTI  PMH: reviewed  ROS:  Gen.: No unexpected weight change, no night sweats Lungs: No cough or shortness of breath Cardiovascular: No palpitations or chest pain  PE: Pulse 67  Temp(Src) 98.4 F (36.9 C) (Oral)  Ht 5' 3.25" (1.607 m)  SpO2 99% General: No acute distress Lungs: Clear to auscultation Cardiovascular: Regular rate rhythm, no edema Abdomen: Mild to moderate discomfort of her suprapubic region, no flank tenderness to palpation  Lab Results  Component Value Date   WBC 4.9 07/04/2011   HGB 12.4 07/04/2011   HCT 36.7 07/04/2011   PLT 126.0* 07/04/2011   GLUCOSE 82 07/04/2011   CHOL 157 07/04/2011   TRIG 22.0 07/04/2011   HDL 84.00 07/04/2011   LDLCALC 69 07/04/2011   ALT 21 07/04/2011   AST 22 07/04/2011   NA 140 07/04/2011   K 3.8 07/04/2011   CL 104 07/04/2011   CREATININE 0.8 07/04/2011   BUN 20 07/04/2011   CO2 30 07/04/2011   TSH 0.87 07/04/2011    Assessment/Plan: UTI, classic symptoms with history of same - prior extensive remote uro eval (p[facility]on) without specific abnormality found  Empiric antibiotic x7 days Urine culture for identification and sensitivities Hydration recommended education provided

## 2011-10-24 NOTE — Patient Instructions (Signed)
It was good to see you today. Test(s) ordered today. Your results will be called to you after review (48-72hours after test completion). If any changes need to be made, you will be notified at that time. Cipro 2x/day for 1 week - Your prescription(s) have been submitted to your pharmacy. Please take as directed and contact our office if you believe you are having problem(s) with the medication(s). Urinary Tract Infection Infections of the urinary tract can start in several places. A bladder infection (cystitis), a kidney infection (pyelonephritis), and a prostate infection (prostatitis) are different types of urinary tract infections (UTIs). They usually get better if treated with medicines (antibiotics) that kill germs. Take all the medicine until it is gone. You or your child may feel better in a few days, but TAKE ALL MEDICINE or the infection may not respond and may become more difficult to treat. HOME CARE INSTRUCTIONS    Drink enough water and fluids to keep the urine clear or pale yellow. Cranberry juice is especially recommended, in addition to large amounts of water.   Avoid caffeine, tea, and carbonated beverages. They tend to irritate the bladder.   Alcohol may irritate the prostate.   Only take over-the-counter or prescription medicines for pain, discomfort, or fever as directed by your caregiver.  To prevent further infections:  Empty the bladder often. Avoid holding urine for long periods of time.   After a bowel movement, women should cleanse from front to back. Use each tissue only once.   Empty the bladder before and after sexual intercourse.  FINDING OUT THE RESULTS OF YOUR TEST Not all test results are available during your visit. If your or your child's test results are not back during the visit, make an appointment with your caregiver to find out the results. Do not assume everything is normal if you have not heard from your caregiver or the medical facility. It is  important for you to follow up on all test results. SEEK MEDICAL CARE IF:    There is back pain.   Your baby is older than 3 months with a rectal temperature of 100.5 F (38.1 C) or higher for more than 1 day.   Your or your child's problems (symptoms) are no better in 3 days. Return sooner if you or your child is getting worse.  SEEK IMMEDIATE MEDICAL CARE IF:    There is severe back pain or lower abdominal pain.   You or your child develops chills.   You have a fever.   Your baby is older than 3 months with a rectal temperature of 102 F (38.9 C) or higher.   Your baby is 41 months old or younger with a rectal temperature of 100.4 F (38 C) or higher.   There is nausea or vomiting.   There is continued burning or discomfort with urination.  MAKE SURE YOU:    Understand these instructions.   Will watch your condition.   Will get help right away if you are not doing well or get worse.  Document Released: 02/28/2005 Document Revised: 05/10/2011 Document Reviewed: 10/03/2006 [facility] Patient Information 2012 [place], [place].

## 2011-12-26 ENCOUNTER — Other Ambulatory Visit: Payer: Self-pay | Admitting: Obstetrics and Gynecology

## 2011-12-26 DIAGNOSIS — Z1231 Encounter for screening mammogram for malignant neoplasm of breast: Secondary | ICD-10-CM

## 2012-02-11 ENCOUNTER — Ambulatory Visit
Admission: RE | Admit: 2012-02-11 | Discharge: 2012-02-11 | Disposition: A | Discharge status: Home / Self Care | Payer: 59 | Source: Ambulatory Visit | Attending: Obstetrics and Gynecology | Admitting: Obstetrics and Gynecology

## 2012-02-11 DIAGNOSIS — Z1231 Encounter for screening mammogram for malignant neoplasm of breast: Secondary | ICD-10-CM

## 2012-04-10 ENCOUNTER — Other Ambulatory Visit: Payer: Self-pay | Admitting: Plastic Surgery

## 2012-04-21 ENCOUNTER — Other Ambulatory Visit (HOSPITAL_COMMUNITY): Payer: Self-pay | Admitting: Obstetrics and Gynecology

## 2012-04-21 DIAGNOSIS — Z803 Family history of malignant neoplasm of breast: Secondary | ICD-10-CM

## 2012-04-30 ENCOUNTER — Encounter: Payer: Self-pay | Admitting: Internal Medicine

## 2012-04-30 NOTE — Telephone Encounter (Signed)
Immunization info added

## 2012-05-07 ENCOUNTER — Ambulatory Visit (HOSPITAL_COMMUNITY)
Admission: RE | Admit: 2012-05-07 | Discharge: 2012-05-07 | Disposition: A | Discharge status: Home / Self Care | Payer: 59 | Source: Ambulatory Visit | Attending: Obstetrics and Gynecology | Admitting: Obstetrics and Gynecology

## 2012-05-07 DIAGNOSIS — Z803 Family history of malignant neoplasm of breast: Secondary | ICD-10-CM | POA: Insufficient documentation

## 2012-05-07 DIAGNOSIS — N644 Mastodynia: Secondary | ICD-10-CM | POA: Insufficient documentation

## 2012-05-07 DIAGNOSIS — N6009 Solitary cyst of unspecified breast: Secondary | ICD-10-CM | POA: Insufficient documentation

## 2012-05-07 DIAGNOSIS — R928 Other abnormal and inconclusive findings on diagnostic imaging of breast: Secondary | ICD-10-CM | POA: Insufficient documentation

## 2012-05-07 MED ORDER — GADOBENATE DIMEGLUMINE 529 MG/ML IV SOLN
10.0000 mL | Freq: Once | INTRAVENOUS | Status: AC | PRN
  Administered 2012-05-07: 10 mL via INTRAVENOUS

## 2012-07-07 ENCOUNTER — Encounter: Payer: Self-pay | Admitting: Internal Medicine

## 2012-07-07 ENCOUNTER — Ambulatory Visit (INDEPENDENT_AMBULATORY_CARE_PROVIDER_SITE_OTHER): Payer: 59 | Admitting: Internal Medicine

## 2012-07-07 VITALS — BP 92/68 | HR 64 | Temp 97.6°F | Ht 63.75 in | Wt 115.0 lb

## 2012-07-07 DIAGNOSIS — M899 Disorder of bone, unspecified: Secondary | ICD-10-CM

## 2012-07-07 DIAGNOSIS — M949 Disorder of cartilage, unspecified: Secondary | ICD-10-CM

## 2012-07-07 DIAGNOSIS — M8580 Other specified disorders of bone density and structure, unspecified site: Secondary | ICD-10-CM

## 2012-07-07 DIAGNOSIS — Z0000 Encounter for general adult medical examination without abnormal findings: Secondary | ICD-10-CM

## 2012-07-07 DIAGNOSIS — E559 Vitamin D deficiency, unspecified: Secondary | ICD-10-CM

## 2012-07-07 DIAGNOSIS — F4323 Adjustment disorder with mixed anxiety and depressed mood: Secondary | ICD-10-CM

## 2012-07-07 MED ORDER — ALPRAZOLAM 0.25 MG PO TABS: 0.2500 mg | ORAL_TABLET | Freq: Two times a day (BID) | ORAL | Status: DC | PRN

## 2012-07-07 NOTE — Assessment & Plan Note (Signed)
Prior osteoporosis when 1st screened in 2004 -  S/p 5 years Fosamax 2005-2010 - mgmt by gyn reviewed Last DEXA improved: now osteopenia at -2.3 - due for repeat at [facility] 09/2012 Continue Vit D (check level today - see next) and Ca + WB exercise

## 2012-07-07 NOTE — Assessment & Plan Note (Signed)
Hx defic with gyn screening - takes OTC supplements Check level today

## 2012-07-07 NOTE — Progress Notes (Signed)
Subjective:    Patient ID: Katherine Vaughan, female    DOB: [birth year], 52 y.o.   MRN: [id number]  HPI  patient is here today for annual physical. Patient feels well overall.  Also reviewed chronic medical issues: Osteopenia. Works with gynecology on same. Previously "osteoporosis" when first diagnosed in 2004. Took bisphosphonate x5 years, stopped same in 2010 because of improvement on DEXA. Participates in weightbearing exercises and takes over-the-counter vitamin D plus calcium  Vitamin D deficiency. Prior diagnosis determined by gynecology. Has taken over-the-counter medications but requests level to be checked  GERD. History of severe symptoms but controlled with dietary discretion, avoidances and daily Nexium. Works with GI as needed  Past Medical History  Diagnosis Date  . GERD (gastroesophageal reflux disease)   . History of kidney stones   . Migraines   . History of colon polyps 2006    adenomatous, no polyps in 2011  . Urine incontinence   . Osteopenia     dexa 08/2010: -2.3 L fem   Family History  Problem Relation Age of Onset  . Arthritis Mother   . Arthritis Father   . Diabetes Other   . Heart disease Other   . Hyperlipidemia Other   . Hypertension Other   . Stroke Other   . Breast cancer Sister 82   History  Substance Use Topics  . Smoking status: Never Smoker   . Smokeless tobacco: Not on file  . Alcohol Use: No    Review of Systems  Constitutional: Negative for fever or weight change.  Respiratory: Negative for cough and shortness of breath.   Cardiovascular: Negative for chest pain or palpitations.  Gastrointestinal: Negative for abdominal pain, no bowel changes.  Musculoskeletal: Negative for gait problem or joint swelling.  Skin: Negative for rash.  Neurological: Negative for dizziness or headache.  No other specific complaints in a complete review of systems (except as listed in HPI above).     Objective:   Physical Exam  BP 92/68  Pulse 64   Temp 97.6 F (36.4 C) (Oral)  Ht 5' 3.75" (1.619 m)  Wt 115 lb (52.164 kg)  BMI 19.89 kg/m2  SpO2 98% Wt Readings from Last 3 Encounters:  07/07/12 115 lb (52.164 kg)  07/04/11 112 lb 12.8 oz (51.166 kg)  10/16/10 111 lb 6.4 oz (50.531 kg)   Constitutional: She is thin/fit. appears well-developed and well-nourished. No distress.  HENT: Head: Normocephalic and atraumatic. Ears: B TMs ok, no erythema or effusion; Nose: Nose normal. Mouth/Throat: Oropharynx is clear and moist. No oropharyngeal exudate.  Eyes: Conjunctivae and EOM are normal. Pupils are equal, round, and reactive to light. No scleral icterus.  Neck: Normal range of motion. Neck supple. No JVD present. No thyromegaly present.  Cardiovascular: Normal rate, regular rhythm and normal heart sounds.  No murmur heard. No BLE edema. Pulmonary/Chest: Effort normal and breath sounds normal. No respiratory distress. She has no wheezes.  Abdominal: Soft. Bowel sounds are normal. She exhibits no distension. There is no tenderness. no masses Musculoskeletal: Normal range of motion, no joint effusions. No gross deformities Neurological: She is alert and oriented to person, place, and time. No cranial nerve deficit. Coordination normal.  Skin: Skin is warm and dry. No rash noted. No erythema.  Psychiatric: She has an appropriately depressed mood and occ tearful affect when talking about mother in law's new dx terminal lung cancer. Her behavior is normal. Judgment and thought content normal.   Lab Results  Component Value Date  WBC 4.9 07/04/2011   HGB 12.4 07/04/2011   HCT 36.7 07/04/2011   PLT 126.0* 07/04/2011   GLUCOSE 82 07/04/2011   CHOL 157 07/04/2011   TRIG 22.0 07/04/2011   HDL 84.00 07/04/2011   LDLCALC 69 07/04/2011   ALT 21 07/04/2011   AST 22 07/04/2011   NA 140 07/04/2011   K 3.8 07/04/2011   CL 104 07/04/2011   CREATININE 0.8 07/04/2011   BUN 20 07/04/2011   CO2 30 07/04/2011   TSH 0.87 07/04/2011   EKG: sinus 71 bpm - no  ischemic change     Assessment & Plan:  CPX - v70.0 - Patient has been counseled on age-appropriate routine health concerns for screening and prevention. These are reviewed and up-to-date. Immunizations are up-to-date or declined. Labs ordered and ECG reviewed.  Situational depression/anxiety - related to new dx SCLC of her mother in law and short term px - extensive support offered today - rx for low dose xanax to use prn, pt agrees to call if need for counseling or other meds - verified no SI/HI

## 2012-07-07 NOTE — Patient Instructions (Signed)
It was good to see you today. We have reviewed your prior records including labs and tests today Test(s) ordered today. Your results will be released to MyChart (or called to you) after review, usually within 72hours after test completion. If any changes need to be made, you will be notified at that same time. Health Maintenance reviewed - all recommended immunizations and age-appropriate screenings are up-to-date. Low dose Xanax to use if needed for "nerves" -Your prescription(s) have been submitted to your pharmacy. Please take as directed and contact our office if you believe you are having problem(s) with the medication(s). Please schedule followup in  1 year for medical physical and labs, call sooner if problems. Schedule DEXA in 09/2012 - let me know if you need referral for this! Health Maintenance, Females A healthy lifestyle and preventative care can promote health and wellness.  Maintain regular health, dental, and eye exams.   Eat a healthy diet. Foods like vegetables, fruits, whole grains, low-fat dairy products, and lean protein foods contain the nutrients you need without too many calories. Decrease your intake of foods high in solid fats, added sugars, and salt. Get information about a proper diet from your caregiver, if necessary.   Regular physical exercise is one of the most important things you can do for your health. Most adults should get at least 150 minutes of moderate-intensity exercise (any activity that increases your heart rate and causes you to sweat) each week. In addition, most adults need muscle-strengthening exercises on 2 or more days a week.     Maintain a healthy weight. The body mass index (BMI) is a screening tool to identify possible weight problems. It provides an estimate of body fat based on height and weight. Your caregiver can help determine your BMI, and can help you achieve or maintain a healthy weight. For adults 20 years and older:   A BMI below 18.5 is  considered underweight.   A BMI of 18.5 to 24.9 is normal.   A BMI of 25 to 29.9 is considered overweight.   A BMI of 30 and above is considered obese.   Maintain normal blood lipids and cholesterol by exercising and minimizing your intake of saturated fat. Eat a balanced diet with plenty of fruits and vegetables. Blood tests for lipids and cholesterol should begin at age 11 and be repeated every 5 years. If your lipid or cholesterol levels are high, you are over 50, or you are a high risk for heart disease, you may need your cholesterol levels checked more frequently. Ongoing high lipid and cholesterol levels should be treated with medicines if diet and exercise are not effective.   If you smoke, find out from your caregiver how to quit. If you do not use tobacco, do not start.   If you are pregnant, do not drink alcohol. If you are breastfeeding, be very cautious about drinking alcohol. If you are not pregnant and choose to drink alcohol, do not exceed 1 drink per day. One drink is considered to be 12 ounces (355 mL) of beer, 5 ounces (148 mL) of wine, or 1.5 ounces (44 mL) of liquor.   Avoid use of street drugs. Do not share needles with anyone. Ask for help if you need support or instructions about stopping the use of drugs.   High blood pressure causes heart disease and increases the risk of stroke. Blood pressure should be checked at least every 1 to 2 years. Ongoing high blood pressure should be treated with  medicines, if weight loss and exercise are not effective.   If you are 60 to 54 years old, ask your caregiver if you should take aspirin to prevent strokes.   Diabetes screening involves taking a blood sample to check your fasting blood sugar level. This should be done once every 3 years, after age 24, if you are within normal weight and without risk factors for diabetes. Testing should be considered at a younger age or be carried out more frequently if you are overweight and have at  least 1 risk factor for diabetes.   Breast cancer screening is essential preventative care for women. You should practice "breast self-awareness." This means understanding the normal appearance and feel of your breasts and may include breast self-examination. Any changes detected, no matter how small, should be reported to a caregiver. Women in their 76s and 30s should have a clinical breast exam (CBE) by a caregiver as part of a regular health exam every 1 to 3 years. After age 5, women should have a CBE every year. Starting at age 28, women should consider having a mammogram (breast X-ray) every year. Women who have a family history of breast cancer should talk to their caregiver about genetic screening. Women at a high risk of breast cancer should talk to their caregiver about having an MRI and a mammogram every year.   The Pap test is a screening test for cervical cancer. Women should have a Pap test starting at age 39. Between ages 80 and 79, Pap tests should be repeated every 2 years. Beginning at age 28, you should have a Pap test every 3 years as long as the past 3 Pap tests have been normal. If you had a hysterectomy for a problem that was not cancer or a condition that could lead to cancer, then you no longer need Pap tests. If you are between ages 11 and 48, and you have had normal Pap tests going back 10 years, you no longer need Pap tests. If you have had past treatment for cervical cancer or a condition that could lead to cancer, you need Pap tests and screening for cancer for at least 20 years after your treatment. If Pap tests have been discontinued, risk factors (such as a new sexual partner) need to be reassessed to determine if screening should be resumed. Some women have medical problems that increase the chance of getting cervical cancer. In these cases, your caregiver may recommend more frequent screening and Pap tests.   The human papillomavirus (HPV) test is an additional test that may  be used for cervical cancer screening. The HPV test looks for the virus that can cause the cell changes on the cervix. The cells collected during the Pap test can be tested for HPV. The HPV test could be used to screen women aged 52 years and older, and should be used in women of any age who have unclear Pap test results. After the age of 64, women should have HPV testing at the same frequency as a Pap test.   Colorectal cancer can be detected and often prevented. Most routine colorectal cancer screening begins at the age of 60 and continues through age 64. However, your caregiver may recommend screening at an earlier age if you have risk factors for colon cancer. On a yearly basis, your caregiver may provide home test kits to check for hidden blood in the stool. Use of a small camera at the end of a tube, to directly examine  the colon (sigmoidoscopy or colonoscopy), can detect the earliest forms of colorectal cancer. Talk to your caregiver about this at age 57, when routine screening begins. Direct examination of the colon should be repeated every 5 to 10 years through age 79, unless early forms of pre-cancerous polyps or small growths are found.   Hepatitis C blood testing is recommended for all people born from [birth year] through 1967 and any individual with known risks for hepatitis C.   Practice safe sex. Use condoms and avoid high-risk sexual practices to reduce the spread of sexually transmitted infections (STIs). Sexually active women aged 22 and younger should be checked for Chlamydia, which is a common sexually transmitted infection. Older women with new or multiple partners should also be tested for Chlamydia. Testing for other STIs is recommended if you are sexually active and at increased risk.   Osteoporosis is a disease in which the bones lose minerals and strength with aging. This can result in serious bone fractures. The risk of osteoporosis can be identified using a bone density scan. Women ages  24 and over and women at risk for fractures or osteoporosis should discuss screening with their caregivers. Ask your caregiver whether you should be taking a calcium supplement or vitamin D to reduce the rate of osteoporosis.   Menopause can be associated with physical symptoms and risks. Hormone replacement therapy is available to decrease symptoms and risks. You should talk to your caregiver about whether hormone replacement therapy is right for you.   Use sunscreen with a sun protection factor (SPF) of 30 or greater. Apply sunscreen liberally and repeatedly throughout the day. You should seek shade when your shadow is shorter than you. Protect yourself by wearing long sleeves, pants, a wide-brimmed hat, and sunglasses year round, whenever you are outdoors.   Notify your caregiver of new moles or changes in moles, especially if there is a change in shape or color. Also notify your caregiver if a mole is larger than the size of a pencil eraser.   Stay current with your immunizations.  Document Released: 12/04/2010 Document Revised: 08/13/2011 Document Reviewed: 12/04/2010 [facility] Patient Information 2013 [place], [place].

## 2012-07-22 ENCOUNTER — Other Ambulatory Visit (INDEPENDENT_AMBULATORY_CARE_PROVIDER_SITE_OTHER): Payer: 59

## 2012-07-22 ENCOUNTER — Ambulatory Visit (INDEPENDENT_AMBULATORY_CARE_PROVIDER_SITE_OTHER): Payer: 59 | Admitting: Internal Medicine

## 2012-07-22 ENCOUNTER — Telehealth: Payer: Self-pay | Admitting: Internal Medicine

## 2012-07-22 ENCOUNTER — Encounter: Payer: Self-pay | Admitting: Internal Medicine

## 2012-07-22 VITALS — BP 98/70 | HR 70 | Temp 97.8°F | Ht 63.75 in | Wt 112.3 lb

## 2012-07-22 DIAGNOSIS — M8580 Other specified disorders of bone density and structure, unspecified site: Secondary | ICD-10-CM

## 2012-07-22 DIAGNOSIS — H6590 Unspecified nonsuppurative otitis media, unspecified ear: Secondary | ICD-10-CM

## 2012-07-22 DIAGNOSIS — H6592 Unspecified nonsuppurative otitis media, left ear: Secondary | ICD-10-CM

## 2012-07-22 DIAGNOSIS — E559 Vitamin D deficiency, unspecified: Secondary | ICD-10-CM

## 2012-07-22 DIAGNOSIS — Z0000 Encounter for general adult medical examination without abnormal findings: Secondary | ICD-10-CM

## 2012-07-22 LAB — HEPATIC FUNCTION PANEL
Bilirubin, Direct: 0.2 mg/dL (ref 0.0–0.3)
Total Bilirubin: 1.2 mg/dL (ref 0.3–1.2)

## 2012-07-22 LAB — BASIC METABOLIC PANEL
BUN: 17 mg/dL (ref 6–23)
CO2: 28 mEq/L (ref 19–32)
Chloride: 106 mEq/L (ref 96–112)
Glucose, Bld: 80 mg/dL (ref 70–99)
Potassium: 4.2 mEq/L (ref 3.5–5.1)

## 2012-07-22 LAB — LIPID PANEL
Cholesterol: 159 mg/dL (ref 0–200)
HDL: 89.1 mg/dL (ref >39.00)
Triglycerides: 21 mg/dL (ref 0.0–149.0)
VLDL: 4.2 mg/dL (ref 0.0–40.0)

## 2012-07-22 LAB — URINALYSIS, ROUTINE W REFLEX MICROSCOPIC
Bilirubin Urine: NEGATIVE
Hgb urine dipstick: NEGATIVE
Ketones, ur: NEGATIVE
Leukocytes, UA: NEGATIVE
Nitrite: NEGATIVE
Total Protein, Urine: NEGATIVE

## 2012-07-22 LAB — CBC WITH DIFFERENTIAL/PLATELET
Basophils Absolute: 0 10*3/uL (ref 0.0–0.1)
HCT: 41 % (ref 36.0–46.0)
Lymphs Abs: 1.3 10*3/uL (ref 0.7–4.0)
MCV: 90 fl (ref 78.0–100.0)
Monocytes Absolute: 0.6 10*3/uL (ref 0.1–1.0)
Neutro Abs: 6.5 10*3/uL (ref 1.4–7.7)
Platelets: 115 10*3/uL — ABNORMAL LOW (ref 150.0–400.0)
RDW: 13.7 % (ref 11.5–14.6)

## 2012-07-22 LAB — TSH: TSH: 1.02 u[IU]/mL (ref 0.35–5.50)

## 2012-07-22 MED ORDER — AMOXICILLIN 500 MG PO CAPS: 500.0000 mg | ORAL_CAPSULE | Freq: Three times a day (TID) | ORAL | Status: DC

## 2012-07-22 NOTE — Patient Instructions (Signed)

## 2012-07-22 NOTE — Telephone Encounter (Signed)
Caller: Katherine Vaughan/Patient; Phone: [phone]; Reason for Call: Caller states she missed call from office today; uncertain who or what it was about.  She is currently not at home.  She was seen in office 07/22/12, states she picked up Rx Amoxicillin.

## 2012-07-22 NOTE — Progress Notes (Signed)
HPI  Pt presents to the clinic today with c/o sore throat, ear pain and cough x 2 days. The worst part is the sore throat. She has been running fevers. She has been taking Nyquil which has helped some. She has been very stressed with the recent death of her Mother in [place]. She has had sick contacts.  Review of Systems      Past Medical History  Diagnosis Date  . GERD (gastroesophageal reflux disease)   . History of kidney stones   . Migraines   . History of colon polyps 2006    adenomatous, no polyps in 2011  . Urine incontinence   . Osteopenia     dexa 08/2010: -2.3 L fem    Family History  Problem Relation Age of Onset  . Arthritis Mother   . Arthritis Father   . Diabetes Other   . Heart disease Other   . Hyperlipidemia Other   . Hypertension Other   . Stroke Other   . Breast cancer Sister 62    History   Social History  . Marital Status: Married    Spouse Name: N/A    Number of Children: N/A  . Years of Education: N/A   Occupational History  . Not on file.   Social History Main Topics  . Smoking status: Never Smoker   . Smokeless tobacco: Not on file  . Alcohol Use: No  . Drug Use: No  . Sexually Active: Not on file   Other Topics Concern  . 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 Demerol   . Percocet (Oxycodone-Acetaminophen)   . Sulfa Antibiotics      Constitutional: Positive headache, fatigue and fever. Denies abrupt weight changes.  HEENT:  Positive sore throat. Denies eye redness, eye pain, pressure behind the eyes, facial pain, nasal congestion, ear pain, ringing in the ears, wax buildup, runny nose or bloody nose. Respiratory: Positive cough. Denies difficulty breathing or shortness of breath.  Cardiovascular: Denies chest pain, chest tightness, palpitations or swelling in the hands or feet.   No other specific complaints in a  complete review of systems (except as listed in HPI above).  Objective:   BP 98/70  Pulse 70  Temp(Src) 97.8 F (36.6 C) (Oral)  Ht 5' 3.75" (1.619 m)  Wt 112 lb 4.8 oz (50.939 kg)  BMI 19.43 kg/m2  SpO2 99% Wt Readings from Last 3 Encounters:  07/22/12 112 lb 4.8 oz (50.939 kg)  07/07/12 115 lb (52.164 kg)  07/04/11 112 lb 12.8 oz (51.166 kg)     General: Appears her stated age, well developed, well nourished in NAD. HEENT: Head: normal shape and size; Eyes: sclera white, no icterus, conjunctiva pink, PERRLA and EOMs intact; Ears: Tm's red and bulging, distorted light reflex; Nose: mucosa pink and moist, septum midline; Throat/Mouth: + PND. Teeth present, mucosa erythematous and moist, no exudate noted, no lesions or ulcerations noted.  Neck: Mild cervical lymphadenopathy. Neck supple, trachea midline. No massses, lumps or thyromegaly present.  Cardiovascular: Normal rate and rhythm. S1,S2 noted.  No murmur, rubs or gallops noted. No JVD or BLE edema. No carotid bruits noted. Pulmonary/Chest: Normal effort and positive vesicular breath sounds. No respiratory distress. No wheezes, rales or ronchi noted.      Assessment & Plan:   Left Otitis Media, new onset with additional  workup required:  Get some rest and drink plenty of water Do salt water gargles for the sore throat eRx for Amoxil TID x 10 days  RTC as needed or if symptoms persist.

## 2012-07-23 LAB — VITAMIN D 25 HYDROXY (VIT D DEFICIENCY, FRACTURES): Vit D, 25-Hydroxy: 62 ng/mL (ref 30–89)

## 2012-07-23 NOTE — Telephone Encounter (Signed)
Not sure who called pt was not contacted by Dr. Felicity Coyer...Raechel Chute

## 2012-09-25 ENCOUNTER — Other Ambulatory Visit (HOSPITAL_COMMUNITY): Payer: Self-pay | Admitting: Obstetrics and Gynecology

## 2012-09-25 DIAGNOSIS — M8580 Other specified disorders of bone density and structure, unspecified site: Secondary | ICD-10-CM

## 2012-10-01 ENCOUNTER — Other Ambulatory Visit: Payer: Self-pay | Admitting: Dermatology

## 2012-10-06 ENCOUNTER — Ambulatory Visit (HOSPITAL_COMMUNITY): Payer: 59

## 2012-10-08 ENCOUNTER — Ambulatory Visit (HOSPITAL_COMMUNITY)
Admission: RE | Admit: 2012-10-08 | Discharge: 2012-10-08 | Disposition: A | Discharge status: Home / Self Care | Payer: 59 | Source: Ambulatory Visit | Attending: Obstetrics and Gynecology | Admitting: Obstetrics and Gynecology

## 2012-10-08 DIAGNOSIS — M949 Disorder of cartilage, unspecified: Secondary | ICD-10-CM | POA: Insufficient documentation

## 2012-10-08 DIAGNOSIS — Z780 Asymptomatic menopausal state: Secondary | ICD-10-CM | POA: Insufficient documentation

## 2012-10-08 DIAGNOSIS — M8580 Other specified disorders of bone density and structure, unspecified site: Secondary | ICD-10-CM

## 2012-10-08 DIAGNOSIS — M899 Disorder of bone, unspecified: Secondary | ICD-10-CM | POA: Insufficient documentation

## 2012-10-08 DIAGNOSIS — Z13820 Encounter for screening for osteoporosis: Secondary | ICD-10-CM | POA: Insufficient documentation

## 2012-11-03 LAB — HM PAP SMEAR

## 2012-12-27 ENCOUNTER — Emergency Department (HOSPITAL_BASED_OUTPATIENT_CLINIC_OR_DEPARTMENT_OTHER)
Admission: EM | Admit: 2012-12-27 | Discharge: 2012-12-27 | Disposition: A | Discharge status: Home / Self Care | Payer: 59 | Attending: Emergency Medicine | Admitting: Emergency Medicine

## 2012-12-27 ENCOUNTER — Emergency Department (HOSPITAL_BASED_OUTPATIENT_CLINIC_OR_DEPARTMENT_OTHER): Payer: 59

## 2012-12-27 ENCOUNTER — Encounter (HOSPITAL_BASED_OUTPATIENT_CLINIC_OR_DEPARTMENT_OTHER): Payer: Self-pay | Admitting: Emergency Medicine

## 2012-12-27 DIAGNOSIS — Y9289 Other specified places as the place of occurrence of the external cause: Secondary | ICD-10-CM | POA: Insufficient documentation

## 2012-12-27 DIAGNOSIS — S9030XA Contusion of unspecified foot, initial encounter: Secondary | ICD-10-CM | POA: Insufficient documentation

## 2012-12-27 DIAGNOSIS — S199XXA Unspecified injury of neck, initial encounter: Secondary | ICD-10-CM | POA: Insufficient documentation

## 2012-12-27 DIAGNOSIS — Z860100 Personal history of colon polyps, unspecified: Secondary | ICD-10-CM | POA: Insufficient documentation

## 2012-12-27 DIAGNOSIS — Z8679 Personal history of other diseases of the circulatory system: Secondary | ICD-10-CM | POA: Insufficient documentation

## 2012-12-27 DIAGNOSIS — M899 Disorder of bone, unspecified: Secondary | ICD-10-CM | POA: Insufficient documentation

## 2012-12-27 DIAGNOSIS — S62309A Unspecified fracture of unspecified metacarpal bone, initial encounter for closed fracture: Secondary | ICD-10-CM

## 2012-12-27 DIAGNOSIS — Y9389 Activity, other specified: Secondary | ICD-10-CM | POA: Insufficient documentation

## 2012-12-27 DIAGNOSIS — W1809XA Striking against other object with subsequent fall, initial encounter: Secondary | ICD-10-CM | POA: Insufficient documentation

## 2012-12-27 DIAGNOSIS — Z87448 Personal history of other diseases of urinary system: Secondary | ICD-10-CM | POA: Insufficient documentation

## 2012-12-27 DIAGNOSIS — Z79899 Other long term (current) drug therapy: Secondary | ICD-10-CM | POA: Insufficient documentation

## 2012-12-27 DIAGNOSIS — K219 Gastro-esophageal reflux disease without esophagitis: Secondary | ICD-10-CM | POA: Insufficient documentation

## 2012-12-27 DIAGNOSIS — Z87442 Personal history of urinary calculi: Secondary | ICD-10-CM | POA: Insufficient documentation

## 2012-12-27 DIAGNOSIS — S0993XA Unspecified injury of face, initial encounter: Secondary | ICD-10-CM | POA: Insufficient documentation

## 2012-12-27 DIAGNOSIS — S62319A Displaced fracture of base of unspecified metacarpal bone, initial encounter for closed fracture: Secondary | ICD-10-CM | POA: Insufficient documentation

## 2012-12-27 MED ORDER — IBUPROFEN 800 MG PO TABS
ORAL_TABLET | ORAL | Status: AC
  Filled 2012-12-27: qty 1

## 2012-12-27 MED ORDER — HYDROCODONE-ACETAMINOPHEN 5-325 MG PO TABS: 2.0000 | ORAL_TABLET | ORAL | Status: DC | PRN

## 2012-12-27 MED ORDER — IBUPROFEN 800 MG PO TABS
800.0000 mg | ORAL_TABLET | Freq: Once | ORAL | Status: AC
  Administered 2012-12-27: 800 mg via ORAL

## 2012-12-27 NOTE — ED Provider Notes (Signed)
CSN: [id number]     Arrival date & time 12/27/12  1724 History    This chart was scribed for Katherine Bucco, MD, by Frederik Pear, ED scribe. The patient was seen in room MH10/MH10 and the patient's care was started at 1917.    First MD Initiated Contact with Patient 12/27/12 1917     Chief Complaint  Patient presents with  . Fall   The history is provided by the patient and medical records. No language interpreter was used.   HPI Comments: Katherine Vaughan is a 52 y.o., right-handed female who presents to the Emergency Department complaining of a fall after she walked into a yellow jacket nest at 16:00. She reports she fell on her wooden deck while swatting to get away from the nest and is unsure of the exact circumstances of the fall, but she denies hitting her head or LOC. In the ED, she is complaining of throbbing, constant right lateral hand pain and left foot pain that is exacerbated with ambulation and touch that began suddenly after the fall. She denies an antalgic gait. Denies head, neck, and back pain. She reports she sustained a yellow jacket sting inferior to the left eye, but denies SOB or symptoms related to the sting. No treatment at home.  Past Medical History  Diagnosis Date  . GERD (gastroesophageal reflux disease)   . History of kidney stones   . Migraines   . History of colon polyps 2006    adenomatous, no polyps in 2011  . Urine incontinence   . Osteopenia     dexa 08/2010: -2.3 L fem   Past Surgical History  Procedure Laterality Date  . Breast surgery      breast biopsy x's 2 (L) 92 and (R) 98  . Abdominal hysterectomy  2006  . Septoplasty    . Laser endrometriosis     Family History  Problem Relation Age of Onset  . Arthritis Mother   . Arthritis Father   . Diabetes Other   . Heart disease Other   . Hyperlipidemia Other   . Hypertension Other   . Stroke Other   . Breast cancer Sister 85   History  Substance Use Topics  . Smoking status: Never Smoker    . Smokeless tobacco: Not on file  . Alcohol Use: No   OB History   Grav Para Term Preterm Abortions TAB SAB Ect Mult Living                 Review of Systems  Constitutional: Negative for fever, chills, diaphoresis and fatigue.  HENT: Negative for congestion, rhinorrhea and sneezing.   Eyes: Negative.   Respiratory: Negative for cough, chest tightness and shortness of breath.   Cardiovascular: Negative for chest pain and leg swelling.  Gastrointestinal: Negative for nausea, vomiting, abdominal pain, diarrhea and blood in stool.  Genitourinary: Negative for frequency, hematuria, flank pain and difficulty urinating.  Musculoskeletal: Positive for myalgias (left foot) and arthralgias (right hand). Negative for back pain and gait problem.  Skin: Positive for wound (yellow jacket sting). Negative for rash.  Neurological: Negative for dizziness, speech difficulty, weakness, numbness and headaches.    Allergies  Demerol; Percocet; and Sulfa antibiotics  Home Medications   Current Outpatient Rx  Name  Route  Sig  Dispense  Refill  . ALPRAZolam (XANAX) 0.25 MG tablet   Oral   Take 1 tablet (0.25 mg total) by mouth 2 (two) times daily as needed for sleep or anxiety.  20 tablet   0   . amoxicillin (AMOXIL) 500 MG capsule   Oral   Take 1 capsule (500 mg total) by mouth 3 (three) times daily.   30 capsule   0   . ampicillin (PRINCIPEN) 500 MG capsule   Oral   Take 500 mg by mouth every other day.           . Calcium Carbonate-Vitamin D (CALCIUM + D PO)   Oral   Take 600 mg by mouth 2 (two) times daily.           . Cholecalciferol (VITAMIN D3) 2000 UNITS TABS   Oral   Take by mouth daily.           Marland Kitchen esomeprazole (NEXIUM) 40 MG capsule   Oral   Take 40 mg by mouth daily.           Marland Kitchen estradiol (VIVELLE-DOT) 0.075 MG/24HR   Transdermal   Place 1 patch onto the skin 2 (two) times a week.         Marland Kitchen HYDROcodone-acetaminophen (NORCO/VICODIN) 5-325 MG per tablet    Oral   Take 2 tablets by mouth every 4 (four) hours as needed for pain.   20 tablet   0   . Multiple Vitamin (MULTIVITAMIN) tablet   Oral   Take 1 tablet by mouth daily.           . Omega-3 Fatty Acids (FISH OIL) 1200 MG CAPS   Oral   Take by mouth 2 (two) times daily.           . vitamin C (ASCORBIC ACID) 500 MG tablet   Oral   Take 500 mg by mouth daily.            BP 114/58  Pulse 70  Temp(Src) 98.9 F (37.2 C) (Oral)  Resp 16  Ht 5\' 4"  (1.626 m)  Wt 120 lb (54.432 kg)  BMI 20.59 kg/m2  SpO2 100% Physical Exam  Nursing note and vitals reviewed. Constitutional: She is oriented to person, place, and time. She appears well-developed and well-nourished.  HENT:  Head: Normocephalic and atraumatic.  Eyes: Pupils are equal, round, and reactive to light.  Mild amount of erythema and swelling under the left lower eyelid.  Neck: Normal range of motion. Neck supple.  Cardiovascular: Intact distal pulses.   Intact DP. Brisk capillary refill.   Pulmonary/Chest: Effort normal. No respiratory distress.  Abdominal: Soft. Bowel sounds are normal. There is no tenderness. There is no rebound and no guarding.  Musculoskeletal: Normal range of motion. She exhibits tenderness. She exhibits no edema.  Tenderness with ecchymosis over the fourth and fifth metacarpals of the right hand. NV intact. No pain to the wrist or elbow. Small area of ecchymosis over the dorsum of the left foot. Mild tenderness to palpation of this area.  Lymphadenopathy:    She has no cervical adenopathy.  Neurological: She is alert and oriented to person, place, and time. She has normal strength. No sensory deficit.  Sensation is intact.   Skin: Skin is warm and dry. Ecchymosis noted. No rash noted.  Psychiatric: She has a normal mood and affect.    ED Course   Procedures (including critical care time)  DIAGNOSTIC STUDIES: Oxygen Saturation is 100% on room air, normal by my interpretation.     COORDINATION OF CARE:  19:35- Discussed planned course of treatment with the patient, including a home treatment plan of following up with ortho and Norco, who  is agreeable at this time.  Labs Reviewed - No data to display Dg Hand Complete Right  12/27/2012   *RADIOLOGY REPORT*  Clinical Data: Fall, injury to right hand, pain/swelling in fourth and fifth metacarpal area  RIGHT HAND - COMPLETE 3+ VIEW  Comparison: None.  Findings: Nondisplaced fracture involving the distal fifth metacarpal.  No additional fracture is seen.  The joint spaces are preserved.  Mild soft tissue swelling along the ulnar aspect of the hand.  IMPRESSION: Nondisplaced fracture involving the distal fifth metacarpal.   Original Report Authenticated By: Charline Bills, M.D.   1. Metacarpal bone fracture, closed, initial encounter     MDM  Pt was placed in an ulnar gutter splint, advised RICE, f/u with ortho.  Pt is not having much pain to foot, will hold off on x-ray, will have it rechecked when she follows up with ortho if symptoms not improved.  I personally performed the services described in this documentation, which was scribed in my presence.  The recorded information has been reviewed and considered.    Katherine Bucco, MD 12/27/12 [phone]

## 2012-12-27 NOTE — ED Notes (Signed)
Pt was stung by yellow jacket, then fell; Pain in Right hand from fall.

## 2012-12-30 ENCOUNTER — Encounter: Payer: Self-pay | Admitting: Internal Medicine

## 2012-12-30 ENCOUNTER — Ambulatory Visit (INDEPENDENT_AMBULATORY_CARE_PROVIDER_SITE_OTHER): Payer: 59 | Admitting: Internal Medicine

## 2012-12-30 VITALS — BP 110/72 | HR 77 | Temp 97.9°F

## 2012-12-30 DIAGNOSIS — M949 Disorder of cartilage, unspecified: Secondary | ICD-10-CM

## 2012-12-30 DIAGNOSIS — T6591XS Toxic effect of unspecified substance, accidental (unintentional), sequela: Secondary | ICD-10-CM

## 2012-12-30 DIAGNOSIS — R600 Localized edema: Secondary | ICD-10-CM

## 2012-12-30 DIAGNOSIS — R609 Edema, unspecified: Secondary | ICD-10-CM

## 2012-12-30 DIAGNOSIS — M899 Disorder of bone, unspecified: Secondary | ICD-10-CM

## 2012-12-30 DIAGNOSIS — M8580 Other specified disorders of bone density and structure, unspecified site: Secondary | ICD-10-CM

## 2012-12-30 MED ORDER — DIPHENHYDRAMINE HCL 25 MG PO TABS: 25.0000 mg | ORAL_TABLET | Freq: Four times a day (QID) | ORAL | Status: DC | PRN

## 2012-12-30 MED ORDER — PREDNISONE (PAK) 10 MG PO TABS: 10.0000 mg | ORAL_TABLET | ORAL | Status: DC

## 2012-12-30 NOTE — Progress Notes (Signed)
  Subjective:    Patient ID: Katherine Vaughan, female    DOB: 1961/04/24, 52 y.o.   MRN: [id number]  HPI  Here for bee sting to left side of face Onset 3 days ago, ER evaluation for same in setting of right hand injury and phalanx fracture Increased swelling of left eyelid (which has improved) and left face No pain, no weakness. No fever or abnormal redness/port No history of prior reaction to insect sting  Past Medical History  Diagnosis Date  . GERD (gastroesophageal reflux disease)   . History of kidney stones   . Migraines   . History of colon polyps 2006    adenomatous, no polyps in 2011  . Urine incontinence   . Osteopenia     dexa 08/2010: -2.3 L fem    Review of Systems  Constitutional: Negative for fever and fatigue.  Eyes: Negative for pain, redness and visual disturbance.       Objective:   Physical Exam BP 110/72  Pulse 77  Temp(Src) 97.9 F (36.6 C) (Oral)  SpO2 96% Gen.: no acute distress, mild to moderate soft tissue swelling of left-sided face HENT: swelling left-sided face as noted, nontender to touch. OP clear, no angioedema Eyes: no periorbital edema. PERRLA, EOMI. Vision grossly intact Lung: clear to auscultation, no wheeze or crackle CV: regular rate and rhythm, no peripheral edema  Lab Results  Component Value Date   WBC 8.5 07/22/2012   HGB 13.9 07/22/2012   HCT 41.0 07/22/2012   PLT 115.0* 07/22/2012   GLUCOSE 80 07/22/2012   CHOL 159 07/22/2012   TRIG 21.0 07/22/2012   HDL 89.10 07/22/2012   LDLCALC 66 07/22/2012   ALT 21 07/22/2012   AST 25 07/22/2012   NA 139 07/22/2012   K 4.2 07/22/2012   CL 106 07/22/2012   CREATININE 0.7 07/22/2012   BUN 17 07/22/2012   CO2 28 07/22/2012   TSH 1.02 07/22/2012    Dg Hand Complete Right  12/27/2012   *RADIOLOGY REPORT*  Clinical Data: Fall, injury to right hand, pain/swelling in fourth and fifth metacarpal area  RIGHT HAND - COMPLETE 3+ VIEW  Comparison: None.  Findings: Nondisplaced fracture involving the distal  fifth metacarpal.  No additional fracture is seen.  The joint spaces are preserved.  Mild soft tissue swelling along the ulnar aspect of the hand.  IMPRESSION: Nondisplaced fracture involving the distal fifth metacarpal.   Original Report Authenticated By: Charline Bills, M.D.      Assessment & Plan:   L side face edema - reaction to yellow jacket sting 3 days ago pred taper x 6 days and benadryl qhs Education reassurance provided

## 2012-12-30 NOTE — Assessment & Plan Note (Signed)
Prior osteoporosis when 1st screened in 2004 -  S/p 5 years Fosamax 2005-2010 - mgmt by gyn reviewed Last DEXA improved:  Continue Vit D (check level today - see next) and Ca + WB exercise

## 2012-12-30 NOTE — Patient Instructions (Signed)
It was good to see you today. We have reviewed your prior records including labs and tests today Medications reviewed and updated, prednisone taper x 6 days and benadryl at night  Your prescription(s) have been submitted to your pharmacy. Please take as directed and contact our office if you believe you are having problem(s) with the medication(s). Good luck with your recovery! Bee, Wasp, or Hornet Sting Your caregiver has diagnosed you as having an insect sting. An insect sting appears as a red lump in the skin that sometimes has a tiny hole in the center, or it may have a stinger in the center of the wound. The most common stings are from wasps, hornets and bees. Individuals have different reactions to insect stings.  A normal reaction may cause pain, swelling, and redness around the sting site.  A localized allergic reaction may cause swelling and redness that extends beyond the sting site.  A large local reaction may continue to develop over the next 12 to 36 hours.  On occasion, the reactions can be severe (anaphylactic reaction). An anaphylactic reaction may cause wheezing; difficulty breathing; chest pain; fainting; raised, itchy, red patches on the skin; a sick feeling to your stomach (nausea); vomiting; cramping; or diarrhea. If you have had an anaphylactic reaction to an insect sting in the past, you are more likely to have one again. HOME CARE INSTRUCTIONS   With bee stings, a small sac of poison is left in the wound. Brushing across this with something such as a credit card, or anything similar, will help remove this and decrease the amount of the reaction. This same procedure will not help a wasp sting as they do not leave behind a stinger and poison sac.  Apply a cold compress for 10 to 20 minutes every hour for 1 to 2 days, depending on severity, to reduce swelling and itching.  To lessen pain, a paste made of water and baking soda may be rubbed on the bite or sting and left on  for 5 minutes.  To relieve itching and swelling, you may use take medication or apply medicated creams or lotions as directed.  Only take over-the-counter or prescription medicines for pain, discomfort, or fever as directed by your caregiver.  Wash the sting site daily with soap and water. Apply antibiotic ointment on the sting site as directed.  If you suffered a severe reaction:  If you did not require hospitalization, an adult will need to stay with you for 24 hours in case the symptoms return.  You may need to wear a medical bracelet or necklace stating the allergy.  You and your family need to learn when and how to use an anaphylaxis kit or epinephrine injection.  If you have had a severe reaction before, always carry your anaphylaxis kit with you. SEEK MEDICAL CARE IF:   None of the above helps within 2 to 3 days.  The area becomes red, warm, tender, and swollen beyond the area of the bite or sting.  You have an oral temperature above 102 F (38.9 C). SEEK IMMEDIATE MEDICAL CARE IF:  You have symptoms of an allergic reaction which are:  Wheezing.  Difficulty breathing.  Chest pain.  Lightheadedness or fainting.  Itchy, raised, red patches on the skin.  Nausea, vomiting, cramping or diarrhea. ANY OF THESE SYMPTOMS MAY REPRESENT A SERIOUS PROBLEM THAT IS AN EMERGENCY. Do not wait to see if the symptoms will go away. Get medical help right away. Call your local emergency  services (911 in [place] DO NOT drive yourself to the hospital. MAKE SURE YOU:   Understand these instructions.  Will watch your condition.  Will get help right away if you are not doing well or get worse. Document Released: 05/21/2005 Document Revised: 08/13/2011 Document Reviewed: 11/05/2009 [facility] Patient Information 2014 [place], [place].

## 2013-01-22 ENCOUNTER — Other Ambulatory Visit: Payer: Self-pay

## 2013-01-22 DIAGNOSIS — Z1231 Encounter for screening mammogram for malignant neoplasm of breast: Secondary | ICD-10-CM

## 2013-02-11 ENCOUNTER — Ambulatory Visit
Admission: RE | Admit: 2013-02-11 | Discharge: 2013-02-11 | Disposition: A | Discharge status: Home / Self Care | Payer: 59 | Source: Ambulatory Visit

## 2013-02-11 DIAGNOSIS — Z1231 Encounter for screening mammogram for malignant neoplasm of breast: Secondary | ICD-10-CM

## 2013-02-13 ENCOUNTER — Other Ambulatory Visit: Payer: Self-pay | Admitting: Internal Medicine

## 2013-02-13 DIAGNOSIS — R928 Other abnormal and inconclusive findings on diagnostic imaging of breast: Secondary | ICD-10-CM

## 2013-02-27 ENCOUNTER — Other Ambulatory Visit: Payer: Self-pay | Admitting: Internal Medicine

## 2013-02-27 ENCOUNTER — Ambulatory Visit
Admission: RE | Admit: 2013-02-27 | Discharge: 2013-02-27 | Disposition: A | Discharge status: Home / Self Care | Payer: 59 | Source: Ambulatory Visit | Attending: Internal Medicine | Admitting: Internal Medicine

## 2013-02-27 DIAGNOSIS — R928 Other abnormal and inconclusive findings on diagnostic imaging of breast: Secondary | ICD-10-CM

## 2013-03-03 ENCOUNTER — Other Ambulatory Visit: Payer: 59

## 2013-03-05 ENCOUNTER — Ambulatory Visit
Admission: RE | Admit: 2013-03-05 | Discharge: 2013-03-05 | Disposition: A | Discharge status: Home / Self Care | Payer: 59 | Source: Ambulatory Visit | Attending: Internal Medicine | Admitting: Internal Medicine

## 2013-03-05 ENCOUNTER — Other Ambulatory Visit: Payer: 59

## 2013-03-05 ENCOUNTER — Other Ambulatory Visit: Payer: Self-pay | Admitting: Internal Medicine

## 2013-03-05 DIAGNOSIS — R928 Other abnormal and inconclusive findings on diagnostic imaging of breast: Secondary | ICD-10-CM

## 2013-03-31 ENCOUNTER — Other Ambulatory Visit (HOSPITAL_COMMUNITY): Payer: Self-pay | Admitting: Obstetrics and Gynecology

## 2013-03-31 DIAGNOSIS — Z803 Family history of malignant neoplasm of breast: Secondary | ICD-10-CM

## 2013-03-31 DIAGNOSIS — R922 Inconclusive mammogram: Secondary | ICD-10-CM

## 2013-04-09 ENCOUNTER — Other Ambulatory Visit: Payer: Self-pay

## 2013-05-11 ENCOUNTER — Ambulatory Visit (HOSPITAL_COMMUNITY)
Admission: RE | Admit: 2013-05-11 | Discharge: 2013-05-11 | Disposition: A | Discharge status: Home / Self Care | Payer: 59 | Source: Ambulatory Visit | Attending: Obstetrics and Gynecology | Admitting: Obstetrics and Gynecology

## 2013-05-11 DIAGNOSIS — D249 Benign neoplasm of unspecified breast: Secondary | ICD-10-CM | POA: Insufficient documentation

## 2013-05-11 DIAGNOSIS — Z803 Family history of malignant neoplasm of breast: Secondary | ICD-10-CM | POA: Insufficient documentation

## 2013-05-11 MED ORDER — GADOBENATE DIMEGLUMINE 529 MG/ML IV SOLN
10.0000 mL | Freq: Once | INTRAVENOUS | Status: AC
  Administered 2013-05-11: 10 mL via INTRAVENOUS

## 2013-06-01 ENCOUNTER — Ambulatory Visit (HOSPITAL_COMMUNITY): Payer: 59

## 2013-07-08 ENCOUNTER — Encounter: Payer: 59 | Admitting: Internal Medicine

## 2013-07-27 ENCOUNTER — Other Ambulatory Visit (INDEPENDENT_AMBULATORY_CARE_PROVIDER_SITE_OTHER): Payer: 59

## 2013-07-27 ENCOUNTER — Ambulatory Visit (INDEPENDENT_AMBULATORY_CARE_PROVIDER_SITE_OTHER): Payer: 59 | Admitting: Internal Medicine

## 2013-07-27 ENCOUNTER — Encounter: Payer: Self-pay | Admitting: Internal Medicine

## 2013-07-27 VITALS — BP 102/72 | HR 61 | Temp 98.0°F | Ht 63.75 in | Wt 113.2 lb

## 2013-07-27 DIAGNOSIS — Z0000 Encounter for general adult medical examination without abnormal findings: Secondary | ICD-10-CM

## 2013-07-27 LAB — URINALYSIS, ROUTINE W REFLEX MICROSCOPIC
BILIRUBIN URINE: NEGATIVE
HGB URINE DIPSTICK: NEGATIVE
LEUKOCYTES UA: NEGATIVE
Nitrite: NEGATIVE
Specific Gravity, Urine: 1.01 (ref 1.000–1.030)
Total Protein, Urine: NEGATIVE
UROBILINOGEN UA: 0.2 (ref 0.0–1.0)
Urine Glucose: NEGATIVE
pH: 7.5 (ref 5.0–8.0)

## 2013-07-27 LAB — CBC WITH DIFFERENTIAL/PLATELET
BASOS ABS: 0.1 10*3/uL (ref 0.0–0.1)
BASOS PCT: 0.9 % (ref 0.0–3.0)
EOS ABS: 0.1 10*3/uL (ref 0.0–0.7)
Eosinophils Relative: 1 % (ref 0.0–5.0)
HCT: 45.2 % (ref 36.0–46.0)
Hemoglobin: 14.7 g/dL (ref 12.0–15.0)
LYMPHS PCT: 30.8 % (ref 12.0–46.0)
Lymphs Abs: 1.9 10*3/uL (ref 0.7–4.0)
MCHC: 32.5 g/dL (ref 30.0–36.0)
MCV: 93.6 fl (ref 78.0–100.0)
MONOS PCT: 4.9 % (ref 3.0–12.0)
Monocytes Absolute: 0.3 10*3/uL (ref 0.1–1.0)
NEUTROS PCT: 62.4 % (ref 43.0–77.0)
Neutro Abs: 3.7 10*3/uL (ref 1.4–7.7)
Platelets: 132 10*3/uL — ABNORMAL LOW (ref 150.0–400.0)
RBC: 4.83 Mil/uL (ref 3.87–5.11)
RDW: 13.5 % (ref 11.5–14.6)
WBC: 6 10*3/uL (ref 4.5–10.5)

## 2013-07-27 NOTE — Progress Notes (Signed)
Pre-visit discussion using our clinic review tool. No additional management support is needed unless otherwise documented below in the visit note.  

## 2013-07-27 NOTE — Patient Instructions (Addendum)
It was good to see you today.  We have reviewed your prior records including labs and tests today  Health Maintenance reviewed - all recommended immunizations and age-appropriate screenings are up-to-date.  Test(s) ordered today. Your results will be released to [facility] (or called to you) after review, usually within 72hours after test completion. If any changes need to be made, you will be notified at that same time.  Medications reviewed and updated, no changes recommended at this time.  Please schedule followup in 12 months for annual exam and labs, call sooner if problems.  Health Maintenance, Female A healthy lifestyle and preventative care can promote health and wellness.  Maintain regular health, dental, and eye exams.  Eat a healthy diet. Foods like vegetables, fruits, whole grains, low-fat dairy products, and lean protein foods contain the nutrients you need without too many calories. Decrease your intake of foods high in solid fats, added sugars, and salt. Get information about a proper diet from your caregiver, if necessary.  Regular physical exercise is one of the most important things you can do for your health. Most adults should get at least 150 minutes of moderate-intensity exercise (any activity that increases your heart rate and causes you to sweat) each week. In addition, most adults need muscle-strengthening exercises on 2 or more days a week.   Maintain a healthy weight. The body mass index (BMI) is a screening tool to identify possible weight problems. It provides an estimate of body fat based on height and weight. Your caregiver can help determine your BMI, and can help you achieve or maintain a healthy weight. For adults 20 years and older:  A BMI below 18.5 is considered underweight.  A BMI of 18.5 to 24.9 is normal.  A BMI of 25 to 29.9 is considered overweight.  A BMI of 30 and above is considered obese.  Maintain normal blood lipids and cholesterol by  exercising and minimizing your intake of saturated fat. Eat a balanced diet with plenty of fruits and vegetables. Blood tests for lipids and cholesterol should begin at age 96 and be repeated every 5 years. If your lipid or cholesterol levels are high, you are over 50, or you are a high risk for heart disease, you may need your cholesterol levels checked more frequently.Ongoing high lipid and cholesterol levels should be treated with medicines if diet and exercise are not effective.  If you smoke, find out from your caregiver how to quit. If you do not use tobacco, do not start.  Lung cancer screening is recommended for adults aged 32 80 years who are at high risk for developing lung cancer because of a history of smoking. Yearly low-dose computed tomography (CT) is recommended for people who have at least a 30-pack-year history of smoking and are a current smoker or have quit within the past 15 years. A pack year of smoking is smoking an average of 1 pack of cigarettes a day for 1 year (for example: 1 pack a day for 30 years or 2 packs a day for 15 years). Yearly screening should continue until the smoker has stopped smoking for at least 15 years. Yearly screening should also be stopped for people who develop a health problem that would prevent them from having lung cancer treatment.  If you are pregnant, do not drink alcohol. If you are breastfeeding, be very cautious about drinking alcohol. If you are not pregnant and choose to drink alcohol, do not exceed 1 drink per day. One drink  is considered to be 12 ounces (355 mL) of beer, 5 ounces (148 mL) of wine, or 1.5 ounces (44 mL) of liquor.  Avoid use of street drugs. Do not share needles with anyone. Ask for help if you need support or instructions about stopping the use of drugs.  High blood pressure causes heart disease and increases the risk of stroke. Blood pressure should be checked at least every 1 to 2 years. Ongoing high blood pressure should be  treated with medicines, if weight loss and exercise are not effective.  If you are 39 to 55 years old, ask your caregiver if you should take aspirin to prevent strokes.  Diabetes screening involves taking a blood sample to check your fasting blood sugar level. This should be done once every 3 years, after age 66, if you are within normal weight and without risk factors for diabetes. Testing should be considered at a younger age or be carried out more frequently if you are overweight and have at least 1 risk factor for diabetes.  Breast cancer screening is essential preventative care for women. You should practice "breast self-awareness." This means understanding the normal appearance and feel of your breasts and may include breast self-examination. Any changes detected, no matter how small, should be reported to a caregiver. Women in their 73s and 30s should have a clinical breast exam (CBE) by a caregiver as part of a regular health exam every 1 to 3 years. After age 39, women should have a CBE every year. Starting at age 91, women should consider having a mammogram (breast X-ray) every year. Women who have a family history of breast cancer should talk to their caregiver about genetic screening. Women at a high risk of breast cancer should talk to their caregiver about having an MRI and a mammogram every year.  Breast cancer gene (BRCA)-related cancer risk assessment is recommended for women who have family members with BRCA-related cancers. BRCA-related cancers include breast, ovarian, tubal, and peritoneal cancers. Having family members with these cancers may be associated with an increased risk for harmful changes (mutations) in the breast cancer genes BRCA1 and BRCA2. Results of the assessment will determine the need for genetic counseling and BRCA1 and BRCA2 testing.  The Pap test is a screening test for cervical cancer. Women should have a Pap test starting at age 63. Between ages 85 and 56, Pap  tests should be repeated every 2 years. Beginning at age 29, you should have a Pap test every 3 years as long as the past 3 Pap tests have been normal. If you had a hysterectomy for a problem that was not cancer or a condition that could lead to cancer, then you no longer need Pap tests. If you are between ages 49 and 47, and you have had normal Pap tests going back 10 years, you no longer need Pap tests. If you have had past treatment for cervical cancer or a condition that could lead to cancer, you need Pap tests and screening for cancer for at least 20 years after your treatment. If Pap tests have been discontinued, risk factors (such as a new sexual partner) need to be reassessed to determine if screening should be resumed. Some women have medical problems that increase the chance of getting cervical cancer. In these cases, your caregiver may recommend more frequent screening and Pap tests.  The human papillomavirus (HPV) test is an additional test that may be used for cervical cancer screening. The HPV test looks for  the virus that can cause the cell changes on the cervix. The cells collected during the Pap test can be tested for HPV. The HPV test could be used to screen women aged 31 years and older, and should be used in women of any age who have unclear Pap test results. After the age of 35, women should have HPV testing at the same frequency as a Pap test.  Colorectal cancer can be detected and often prevented. Most routine colorectal cancer screening begins at the age of 76 and continues through age 69. However, your caregiver may recommend screening at an earlier age if you have risk factors for colon cancer. On a yearly basis, your caregiver may provide home test kits to check for hidden blood in the stool. Use of a small camera at the end of a tube, to directly examine the colon (sigmoidoscopy or colonoscopy), can detect the earliest forms of colorectal cancer. Talk to your caregiver about this at  age 68, when routine screening begins. Direct examination of the colon should be repeated every 5 to 10 years through age 84, unless early forms of pre-cancerous polyps or small growths are found.  Hepatitis C blood testing is recommended for all people born from [birth year] through 1967 and any individual with known risks for hepatitis C.  Practice safe sex. Use condoms and avoid high-risk sexual practices to reduce the spread of sexually transmitted infections (STIs). Sexually active women aged 66 and younger should be checked for Chlamydia, which is a common sexually transmitted infection. Older women with new or multiple partners should also be tested for Chlamydia. Testing for other STIs is recommended if you are sexually active and at increased risk.  Osteoporosis is a disease in which the bones lose minerals and strength with aging. This can result in serious bone fractures. The risk of osteoporosis can be identified using a bone density scan. Women ages 65 and over and women at risk for fractures or osteoporosis should discuss screening with their caregivers. Ask your caregiver whether you should be taking a calcium supplement or vitamin D to reduce the rate of osteoporosis.  Menopause can be associated with physical symptoms and risks. Hormone replacement therapy is available to decrease symptoms and risks. You should talk to your caregiver about whether hormone replacement therapy is right for you.  Use sunscreen. Apply sunscreen liberally and repeatedly throughout the day. You should seek shade when your shadow is shorter than you. Protect yourself by wearing long sleeves, pants, a wide-brimmed hat, and sunglasses year round, whenever you are outdoors.  Notify your caregiver of new moles or changes in moles, especially if there is a change in shape or color. Also notify your caregiver if a mole is larger than the size of a pencil eraser.  Stay current with your immunizations. Document Released:  12/04/2010 Document Revised: 09/15/2012 Document Reviewed: 12/04/2010 [facility] Patient Information 2014 [place].

## 2013-07-27 NOTE — Progress Notes (Signed)
Subjective:    Patient ID: Katherine Vaughan, female    DOB: [birth year], 53 y.o.   MRN: [id number]  HPI patient is here today for annual physical. Patient feels well overall.  Also reviewed chronic medical issues: Osteopenia. Works with gynecology on same. Previously "osteoporosis" when first diagnosed in 2004. Took bisphosphonate x5 years, stopped same in 2010 because of improvement on DEXA. Participates in weightbearing exercises and takes over-the-counter vitamin D plus calcium  Vitamin D deficiency. Prior diagnosis determined by gynecology. Has taken over-the-counter medications and follows with gyn for same  GERD. History of severe symptoms but controlled with dietary discretion, avoidances and daily Nexium. Works with GI as needed  Past Medical History  Diagnosis Date  . GERD (gastroesophageal reflux disease)   . History of kidney stones   . Migraines   . History of colon polyps 2006    adenomatous, no polyps in 2011  . Urine incontinence   . Osteopenia     dexa 08/2010: -2.3 L fem   Family History  Problem Relation Age of Onset  . Arthritis Mother   . Arthritis Father   . Diabetes Other   . Heart disease Other   . Hyperlipidemia Other   . Hypertension Other   . Stroke Other   . Breast cancer Sister 57  . Lung cancer Mother    History  Substance Use Topics  . Smoking status: Never Smoker   . Smokeless tobacco: Not on file  . Alcohol Use: No    Review of Systems  Constitutional: Negative for fatigue and unexpected weight change.  Respiratory: Negative for cough, shortness of breath and wheezing.   Cardiovascular: Negative for chest pain, palpitations and leg swelling.  Gastrointestinal: Negative for nausea, abdominal pain and diarrhea.  Neurological: Negative for dizziness, weakness, light-headedness and headaches.  Psychiatric/Behavioral: Negative for dysphoric mood. The patient is not nervous/anxious.   All other systems reviewed and are negative.         Objective:   Physical Exam BP 102/72  Pulse 61  Temp(Src) 98 F (36.7 C) (Oral)  Ht 5' 3.75" (1.619 m)  Wt 113 lb 3.2 oz (51.347 kg)  BMI 19.59 kg/m2  SpO2 98% Wt Readings from Last 3 Encounters:  07/27/13 113 lb 3.2 oz (51.347 kg)  12/27/12 120 lb (54.432 kg)  07/22/12 112 lb 4.8 oz (50.939 kg)   Constitutional: She is thin/fit. appears well-developed and well-nourished. No distress.  HENT: Head: Normocephalic and atraumatic. Ears: B TMs ok, no erythema or effusion; Nose: Nose normal. Mouth/Throat: Oropharynx is clear and moist. No oropharyngeal exudate.  Eyes: Conjunctivae and EOM are normal. Pupils are equal, round, and reactive to light. No scleral icterus.  Neck: Normal range of motion. Neck supple. No JVD present. No thyromegaly present.  Cardiovascular: Normal rate, regular rhythm and normal heart sounds.  No murmur heard. No BLE edema. Pulmonary/Chest: Effort normal and breath sounds normal. No respiratory distress. She has no wheezes.  Abdominal: Soft. Bowel sounds are normal. She exhibits no distension. There is no tenderness. no masses Musculoskeletal: Normal range of motion, no joint effusions. No gross deformities Neurological: She is alert and oriented to person, place, and time. No cranial nerve deficit. Coordination normal.  Skin: Skin is warm and dry. No rash noted. No erythema.  Psychiatric: She has an appropriately depressed mood and occ tearful affect when talking about mother in [place] new dx terminal lung cancer. Her behavior is normal. Judgment and thought content normal.   Lab Results  Component Value Date   WBC 8.5 07/22/2012   HGB 13.9 07/22/2012   HCT 41.0 07/22/2012   PLT 115.0* 07/22/2012   GLUCOSE 80 07/22/2012   CHOL 159 07/22/2012   TRIG 21.0 07/22/2012   HDL 89.10 07/22/2012   LDLCALC 66 07/22/2012   ALT 21 07/22/2012   AST 25 07/22/2012   NA 139 07/22/2012   K 4.2 07/22/2012   CL 106 07/22/2012   CREATININE 0.7 07/22/2012   BUN 17 07/22/2012   CO2 28  07/22/2012   TSH 1.02 07/22/2012       Assessment & Plan:  CPX - v70.0 - Patient has been counseled on age-appropriate routine health concerns for screening and prevention. These are reviewed and up-to-date. Immunizations are up-to-date or declined. Labs ordered and reviewed.

## 2013-07-28 LAB — BASIC METABOLIC PANEL
BUN: 16 mg/dL (ref 6–23)
CALCIUM: 9.2 mg/dL (ref 8.4–10.5)
CO2: 30 meq/L (ref 19–32)
CREATININE: 0.6 mg/dL (ref 0.4–1.2)
Chloride: 102 mEq/L (ref 96–112)
GFR: 117.16 mL/min (ref >60.00)
Glucose, Bld: 80 mg/dL (ref 70–99)
Potassium: 4 mEq/L (ref 3.5–5.1)
SODIUM: 138 meq/L (ref 135–145)

## 2013-07-28 LAB — HEPATIC FUNCTION PANEL
ALK PHOS: 37 U/L — AB (ref 39–117)
ALT: 19 U/L (ref 0–35)
AST: 22 U/L (ref 0–37)
Albumin: 4.2 g/dL (ref 3.5–5.2)
BILIRUBIN DIRECT: 0.2 mg/dL (ref 0.0–0.3)
Total Bilirubin: 1.3 mg/dL — ABNORMAL HIGH (ref 0.3–1.2)
Total Protein: 6.7 g/dL (ref 6.0–8.3)

## 2013-07-28 LAB — LIPID PANEL
CHOL/HDL RATIO: 2
Cholesterol: 191 mg/dL (ref 0–200)
HDL: 95.9 mg/dL (ref >39.00)
LDL Cholesterol: 86 mg/dL (ref 0–99)
Triglycerides: 47 mg/dL (ref 0.0–149.0)
VLDL: 9.4 mg/dL (ref 0.0–40.0)

## 2013-07-28 LAB — TSH: TSH: 0.88 u[IU]/mL (ref 0.35–5.50)

## 2013-12-16 ENCOUNTER — Encounter: Payer: Self-pay | Admitting: Internal Medicine

## 2013-12-17 ENCOUNTER — Encounter: Payer: Self-pay | Admitting: Internal Medicine

## 2014-01-04 ENCOUNTER — Ambulatory Visit (HOSPITAL_BASED_OUTPATIENT_CLINIC_OR_DEPARTMENT_OTHER): Payer: 59 | Admitting: Genetic Counselor

## 2014-01-04 ENCOUNTER — Encounter: Payer: Self-pay | Admitting: Genetic Counselor

## 2014-01-04 ENCOUNTER — Other Ambulatory Visit: Payer: 59

## 2014-01-04 DIAGNOSIS — IMO0002 Reserved for concepts with insufficient information to code with codable children: Secondary | ICD-10-CM

## 2014-01-04 DIAGNOSIS — Z8041 Family history of malignant neoplasm of ovary: Secondary | ICD-10-CM

## 2014-01-04 DIAGNOSIS — Z803 Family history of malignant neoplasm of breast: Secondary | ICD-10-CM

## 2014-01-04 DIAGNOSIS — Z800 Family history of malignant neoplasm of digestive organs: Secondary | ICD-10-CM

## 2014-01-04 NOTE — Progress Notes (Signed)
HISTORY OF PRESENT ILLNESS: Dr. Asa Lente requested a cancer genetics consultation for Katherine Vaughan, a 53 y.o. female, due to a family history of cancer.  Ms. Katherine Vaughan presents to clinic today to discuss the possibility of a hereditary predisposition to cancer, genetic testing, and to further clarify her future cancer risks, as well as potential cancer risk for family members. Ms. Katherine Vaughan has no personal history of cancer.   Past Medical History  Diagnosis Date   GERD (gastroesophageal reflux disease)    History of kidney stones    Migraines    History of colon polyps 2006    adenomatous, no polyps in 2011   Urine incontinence    Osteopenia     dexa 08/2010: -2.3 L fem    Past Surgical History  Procedure Laterality Date   Breast surgery      breast biopsy x's 2 (L) 92 and (R) 98   Abdominal hysterectomy  2006   Septoplasty     Laser endrometriosis     History   Social History   Marital Status: Married    Spouse Name: N/A    Number of Children: N/A   Years of Education: N/A   Social History Main Topics   Smoking status: Never Smoker    Smokeless tobacco: Not on file   Alcohol Use: No   Drug Use: No   Sexual Activity: Not on file   Other Topics Concern   Not on file   Social History Narrative   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 Significant diagnoses include the following:  Family History  Problem Relation Age of Onset   Arthritis Mother    Diverticulitis Mother    Arthritis Father    Diabetes Other    Heart disease Other    Hyperlipidemia Other    Hypertension Other    Stroke Other    Breast cancer Sister 79    kidney cancer at age 88   Diverticulitis Sister 34    s/p colectomy   Diverticulitis Brother    Pancreatic cancer Paternal Uncle 43   Stomach cancer Paternal Uncle 68   Ovarian cancer Other 40    mat great  aunt through [facility] with ovarian    Ms. Katherine Vaughan's ancestry is of Caucasian descent. There is no known Jewish ancestry or consanguinity.  GENETIC COUNSELING ASSESSMENT: Ms. Katherine Vaughan is a 53 y.o. female with a family history of cancer suggestive of a hereditary predisposition to cancer. We, therefore, discussed and recommended the following at today's visit.   DISCUSSION: We reviewed the characteristics, features and inheritance patterns of hereditary cancer syndromes. We also discussed genetic testing, including the appropriate family members to test, the process of testing, insurance coverage and turn-around-time for results. We discussed the implications of a negative, positive and/or variant of uncertain significant result. We recommended Ms. Katherine Vaughan pursue genetic testing for the [employer] gene panel.  PLAN: Based on our above recommendation, Ms. Katherine Vaughan wished to pursue genetic testing and the blood sample was drawn and will be sent to [facility] for analysis. Results should be available within approximately 5 weeks time, at which point they will be disclosed by telephone to Ms. Katherine Vaughan, as will any additional recommendations warranted by these results.   Based on Ms. Katherine Vaughan family history, we recommended her sister, who was diagnosed with breast cancer at 73 and kidney  cancer at 33, have genetic counseling and testing. We discussed that it is always most informative to initiate genetic testing in a family member diagnosed with cancer. Ms. Katherine Vaughan will speak with this family member and let us know if we can be of any assistance in coordinating genetic counseling and/or testing.  We also encouraged Ms. Katherine Vaughan to remain in contact with cancer genetics annually so that we can continuously update the family history and inform her of any changes in cancer genetics and testing that may be of benefit for this family. Ms.  Katherine Vaughan questions were answered to her satisfaction today. Our contact  information was provided should additional questions or concerns arise.   Thank you for the referral and allowing [place] to share in the care of your patient.   The patient was seen for a total of 40 minutes in face-to-face genetic counseling.  This patient was discussed with Magrinat who agrees with the above.    _______________________________________________________________________ For Office Staff:  Number of people involved in session: 2 Was an Intern/ student involved with case: not applicable

## 2014-02-03 ENCOUNTER — Encounter: Payer: Self-pay | Admitting: Genetic Counselor

## 2014-02-03 DIAGNOSIS — Z8041 Family history of malignant neoplasm of ovary: Secondary | ICD-10-CM

## 2014-02-03 DIAGNOSIS — Z803 Family history of malignant neoplasm of breast: Secondary | ICD-10-CM

## 2014-02-03 DIAGNOSIS — Z800 Family history of malignant neoplasm of digestive organs: Secondary | ICD-10-CM

## 2014-02-03 NOTE — Progress Notes (Signed)
HPI:  Ms. Grumbine was previously seen in the [facility] due to a family history of cancer and concerns regarding a hereditary predisposition to cancer. Please refer to our prior cancer genetics clinic note for more information regarding Ms. Gutt's medical, social and family histories, and our assessment and recommendations, at the time. Ms. Renbarger recent genetic test results were disclosed to her, as were recommendations warranted by these results. These results and recommendations are discussed in more detail below.  GENETIC TEST RESULTS: At the time of Ms. Mennen's visit, we recommended she pursue genetic testing of the OvaNext gene panel. This test, which included sequencing and deletion/duplication analysis of the genes, was performed at [facility]. Genetic testing was normal, and did not reveal a deleterious mutation in these genes. A complete list of all genes tested is located on the test report scanned into EPIC.    We discussed with Ms. Matsushita that since the current genetic testing is not perfect, it is possible there may be a gene mutation in one of these genes that current testing cannot detect, but that chance is small.  We also discussed, that it is possible that another gene that has not yet been discovered, or that we have not yet tested, is responsible for the cancer diagnoses in the family, and it is, therefore, important to remain in touch with cancer genetics in the future so that we can continue to offer Ms. Mcqueary the most up to date genetic testing.   CANCER SCREENING RECOMMENDATIONS: This normal result is reassuring and indicates that Ms. Chunn does not likely have an increased risk of cancer due to a a mutation in one of these genes.  We, therefore, recommended  Ms. Jaskiewicz continue to follow the cancer screening guidelines provided by her primary healthcare providers.   RECOMMENDATIONS FOR FAMILY MEMBERS:  We recommended further genetic  testing in Ms. Tatum's family as such testing might help [place] be even more confident in interpreting Ms. Calamia's own results. Genetic testing is best initiated in someone who has had cancer.  In this family, her sister diagnosed with breast cancer would be the most informative person to test. Please let us know if we can help facilitate testing. Genetic counselors can be located in other cities, by visiting the website of the [employer] ([employer]ist[employer].se) and Field seismologist for a Dietitian by zip code.  FOLLOW-UP: Lastly, we discussed with Ms. Wiegand that cancer genetics is a rapidly advancing field and it is possible that new genetic tests will be appropriate for her and/or her family members in the future. We encouraged her to remain in contact with cancer genetics on an annual basis so we can update her personal and family histories and let her know of advances in cancer genetics that may benefit this family.   Our contact number was provided. Ms. Therrell questions were answered to her satisfaction, and she knows she is welcome to call us at anytime with additional questions or concerns.   Catherine A. Fine, MS, CGC Certified Genetic Counseor [email or web address]

## 2014-02-26 ENCOUNTER — Encounter: Payer: Self-pay | Admitting: Internal Medicine

## 2014-03-15 ENCOUNTER — Other Ambulatory Visit (HOSPITAL_COMMUNITY): Payer: Self-pay | Admitting: Gastroenterology

## 2014-03-15 DIAGNOSIS — R1084 Generalized abdominal pain: Secondary | ICD-10-CM

## 2014-03-19 ENCOUNTER — Other Ambulatory Visit (HOSPITAL_COMMUNITY): Payer: Self-pay | Admitting: Obstetrics and Gynecology

## 2014-03-22 ENCOUNTER — Ambulatory Visit (HOSPITAL_COMMUNITY): Payer: 59

## 2014-03-23 ENCOUNTER — Ambulatory Visit (HOSPITAL_COMMUNITY)
Admission: RE | Admit: 2014-03-23 | Discharge: 2014-03-23 | Disposition: A | Discharge status: Home / Self Care | Payer: 59 | Source: Ambulatory Visit | Attending: Gastroenterology | Admitting: Gastroenterology

## 2014-03-23 DIAGNOSIS — N200 Calculus of kidney: Secondary | ICD-10-CM | POA: Insufficient documentation

## 2014-03-23 DIAGNOSIS — R1084 Generalized abdominal pain: Secondary | ICD-10-CM | POA: Diagnosis present

## 2014-03-24 ENCOUNTER — Other Ambulatory Visit: Payer: Self-pay

## 2014-03-24 ENCOUNTER — Other Ambulatory Visit (HOSPITAL_COMMUNITY): Payer: Self-pay | Admitting: Obstetrics and Gynecology

## 2014-03-24 DIAGNOSIS — Z803 Family history of malignant neoplasm of breast: Secondary | ICD-10-CM

## 2014-03-24 DIAGNOSIS — Z1231 Encounter for screening mammogram for malignant neoplasm of breast: Secondary | ICD-10-CM

## 2014-04-13 ENCOUNTER — Ambulatory Visit
Admission: RE | Admit: 2014-04-13 | Discharge: 2014-04-13 | Disposition: A | Discharge status: Home / Self Care | Payer: 59 | Source: Ambulatory Visit

## 2014-04-13 DIAGNOSIS — Z1231 Encounter for screening mammogram for malignant neoplasm of breast: Secondary | ICD-10-CM

## 2014-04-16 ENCOUNTER — Other Ambulatory Visit: Payer: Self-pay | Admitting: Internal Medicine

## 2014-04-16 DIAGNOSIS — N6452 Nipple discharge: Secondary | ICD-10-CM

## 2014-05-04 ENCOUNTER — Other Ambulatory Visit: Payer: 59

## 2014-05-05 ENCOUNTER — Ambulatory Visit
Admission: RE | Admit: 2014-05-05 | Discharge: 2014-05-05 | Disposition: A | Discharge status: Home / Self Care | Payer: 59 | Source: Ambulatory Visit | Attending: Internal Medicine | Admitting: Internal Medicine

## 2014-05-05 DIAGNOSIS — N6452 Nipple discharge: Secondary | ICD-10-CM

## 2014-05-12 ENCOUNTER — Ambulatory Visit (HOSPITAL_COMMUNITY)
Admission: RE | Admit: 2014-05-12 | Discharge: 2014-05-12 | Disposition: A | Discharge status: Home / Self Care | Payer: 59 | Source: Ambulatory Visit | Attending: Obstetrics and Gynecology | Admitting: Obstetrics and Gynecology

## 2014-05-12 DIAGNOSIS — Z803 Family history of malignant neoplasm of breast: Secondary | ICD-10-CM | POA: Diagnosis not present

## 2014-05-12 MED ORDER — GADOBENATE DIMEGLUMINE 529 MG/ML IV SOLN
14.0000 mL | Freq: Once | INTRAVENOUS | Status: AC | PRN
  Administered 2014-05-12: 14 mL via INTRAVENOUS

## 2014-06-04 HISTORY — PX: COLONOSCOPY: SHX174

## 2014-08-02 ENCOUNTER — Ambulatory Visit (INDEPENDENT_AMBULATORY_CARE_PROVIDER_SITE_OTHER): Payer: 59 | Admitting: Family

## 2014-08-02 ENCOUNTER — Encounter: Payer: 59 | Admitting: Internal Medicine

## 2014-08-02 ENCOUNTER — Encounter: Payer: Self-pay | Admitting: Family

## 2014-08-02 ENCOUNTER — Other Ambulatory Visit (INDEPENDENT_AMBULATORY_CARE_PROVIDER_SITE_OTHER): Payer: 59

## 2014-08-02 VITALS — BP 110/80 | HR 58 | Temp 98.0°F | Resp 18 | Ht 63.0 in | Wt 116.8 lb

## 2014-08-02 DIAGNOSIS — Z0000 Encounter for general adult medical examination without abnormal findings: Secondary | ICD-10-CM

## 2014-08-02 LAB — LIPID PANEL
CHOL/HDL RATIO: 2
Cholesterol: 167 mg/dL (ref 0–200)
HDL: 86.1 mg/dL (ref >39.00)
LDL CALC: 74 mg/dL (ref 0–99)
NonHDL: 80.9
Triglycerides: 33 mg/dL (ref 0.0–149.0)
VLDL: 6.6 mg/dL (ref 0.0–40.0)

## 2014-08-02 LAB — BASIC METABOLIC PANEL
BUN: 18 mg/dL (ref 6–23)
CHLORIDE: 105 meq/L (ref 96–112)
CO2: 29 mEq/L (ref 19–32)
Calcium: 9.2 mg/dL (ref 8.4–10.5)
Creatinine, Ser: 0.75 mg/dL (ref 0.40–1.20)
GFR: 85.04 mL/min (ref >60.00)
GLUCOSE: 83 mg/dL (ref 70–99)
POTASSIUM: 3.8 meq/L (ref 3.5–5.1)
Sodium: 140 mEq/L (ref 135–145)

## 2014-08-02 LAB — CBC
HEMATOCRIT: 41.4 % (ref 36.0–46.0)
Hemoglobin: 13.8 g/dL (ref 12.0–15.0)
MCHC: 33.3 g/dL (ref 30.0–36.0)
MCV: 90.1 fl (ref 78.0–100.0)
Platelets: 121 10*3/uL — ABNORMAL LOW (ref 150.0–400.0)
RBC: 4.6 Mil/uL (ref 3.87–5.11)
RDW: 13.8 % (ref 11.5–15.5)
WBC: 4.4 10*3/uL (ref 4.0–10.5)

## 2014-08-02 LAB — TSH: TSH: 1.03 u[IU]/mL (ref 0.35–4.50)

## 2014-08-02 NOTE — Progress Notes (Signed)
Pre visit review using our clinic review tool, if applicable. No additional management support is needed unless otherwise documented below in the visit note. 

## 2014-08-02 NOTE — Assessment & Plan Note (Addendum)
1) Anticipatory Guidance: Discussed importance of wearing a seatbelt while driving and not texting while driving; changing batteries in smoke detector at least once annually; wearing suntan lotion when outside; eating a balanced and moderate diet; getting physical activity at least 30 minutes per day.  2) Immunizations / Screenings / Labs:  All immunizations are up-to-date per recommendations. Obtain HIV screen today. All other screenings are up-to-date per recommendations. Obtain CBC, BMET, Lipid profile and TSH.   Overall well physical exam. Patient has minimal cardiovascular risk factors at this time. Continue current healthy lifestyle behaviors. Goal will be to increase physical activity 30 minutes most days of the week. Follow-up prevention exam in one year.

## 2014-08-02 NOTE — Progress Notes (Signed)
Subjective:    Patient ID: Katherine Vaughan, female    DOB: February 25, 1961, 54 y.o.   MRN: [id number]  Chief Complaint  Patient presents with  . CPE    Fasting    HPI:  Katherine Vaughan is a 54 y.o. female who presents today for an annual wellness visit.   1) Health Maintenance -   Diet - Averages about 3 meals per day; whole grains, fruits and vegetables; No caffeine  Exercise - Walks daily; Indicates she needs to do more cardio   2) Preventative Exams / Immunizations:  Dental -- Up to date  Vision -- Up to date   Health Maintenance  Topic Date Due  . HIV Screening  11/01/1973  . INFLUENZA VACCINE  01/03/2015  . MAMMOGRAM  05/05/2016  . TETANUS/TDAP  01/05/2017  . COLONOSCOPY  10/02/2019    Immunization History  Administered Date(s) Administered  . Influenza Split 03/05/2011  . Influenza Whole 03/04/2012  . Influenza,inj,Quad PF,36+ Mos 03/04/2013, 02/23/2014  . Tdap 01/06/2007    Allergies  Allergen Reactions  . Demerol   . Percocet [Oxycodone-Acetaminophen]   . Sulfa Antibiotics     Current Outpatient Prescriptions on File Prior to Visit  Medication Sig Dispense Refill  . ampicillin (PRINCIPEN) 500 MG capsule Take 500 mg by mouth every other day.      . Calcium Carbonate-Vitamin D (CALCIUM + D PO) Take 600 mg by mouth 2 (two) times daily.      . Cholecalciferol (VITAMIN D3) 2000 UNITS TABS Take by mouth daily.      Marland Kitchen esomeprazole (NEXIUM) 40 MG capsule Take 40 mg by mouth daily.      Marland Kitchen estradiol (VIVELLE-DOT) 0.075 MG/24HR Place 1 patch onto the skin 2 (two) times a week.    . Multiple Vitamin (MULTIVITAMIN) tablet Take 1 tablet by mouth daily.      . Omega-3 Fatty Acids (FISH OIL) 1200 MG CAPS Take by mouth 2 (two) times daily.      . vitamin C (ASCORBIC ACID) 500 MG tablet Take 500 mg by mouth daily.       No current facility-administered medications on file prior to visit.    Past Medical History  Diagnosis Date  . GERD (gastroesophageal reflux  disease)   . History of kidney stones   . Migraines   . History of colon polyps 2006    adenomatous, no polyps in 2011  . Urine incontinence   . Osteopenia     dexa 08/2010: -2.3 L fem    Past Surgical History  Procedure Laterality Date  . Breast surgery      breast biopsy x's 2 (L) 92 and (R) 98  . Abdominal hysterectomy  2006  . Septoplasty    . Laser endrometriosis      Family History  Problem Relation Age of Onset  . Arthritis Mother   . Diverticulitis Mother   . Arthritis Father   . Diabetes Other   . Heart disease Other   . Hyperlipidemia Other   . Hypertension Other   . Stroke Other   . Breast cancer Sister 32    kidney cancer at age 12  . Diverticulitis Sister 51    s/p colectomy  . Diverticulitis Brother   . Pancreatic cancer Paternal Uncle 74  . Stomach cancer Paternal Uncle 69  . Ovarian cancer Other 75    mat great aunt through [facility] with ovarian    History   Social History  . Marital  Status: Married    Spouse Name: N/A  . Number of Children: N/A  . Years of Education: N/A   Occupational History  . Not on file.   Social History Main Topics  . Smoking status: Never Smoker   . Smokeless tobacco: Never Used  . Alcohol Use: No  . Drug Use: No  . Sexual Activity: Not on file   Other Topics Concern  . 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 HENT: Head: Denies headache or neck pain Ears: Denies changes in hearing, ringing in ears, earache, drainage Nose: Denies discharge, stuffiness, itching, nosebleed, sinus pain Throat: Denies sore throat, hoarseness, dry mouth, sores, thrush Eyes: Denies loss/changes in vision, pain, redness, blurry/double vision, flashing lights Cardiovascular: Denies chest pain/discomfort, tightness, palpitations, shortness of breath with activity,  difficulty lying down, swelling, sudden awakening with shortness of breath Respiratory: Denies shortness of breath, cough, sputum production, wheezing Gastrointestinal: Denies dysphasia, heartburn, change in appetite, nausea, change in bowel habits, rectal bleeding, constipation, diarrhea, yellow skin or eyes Genitourinary: Denies frequency, urgency, burning/pain, blood in urine, incontinence, change in urinary strength. Musculoskeletal: Denies muscle/joint pain, stiffness, back pain, redness or swelling of joints, trauma Skin: Denies rashes, lumps, itching, dryness, color changes, or hair/nail changes Neurological: Denies dizziness, fainting, seizures, weakness, numbness, tingling, tremor Psychiatric - Denies nervousness, stress, depression or memory loss Endocrine: Denies heat or cold intolerance, sweating, frequent urination, excessive thirst, changes in appetite Hematologic: Denies ease of bruising or bleeding     Objective:     BP 110/80 mmHg  Pulse 58  Temp(Src) 98 F (36.7 C) (Oral)  Resp 18  Ht 5\' 3"  (1.6 m)  Wt 116 lb 12.8 oz (52.98 kg)  BMI 20.70 kg/m2  SpO2 99% Nursing note and vital signs reviewed.  Physical Exam  Constitutional: She is oriented to person, place, and time. She appears well-developed and well-nourished.  HENT:  Head: Normocephalic.  Right Ear: Hearing, tympanic membrane, external ear and ear canal normal.  Left Ear: Hearing, tympanic membrane, external ear and ear canal normal.  Nose: Nose normal.  Mouth/Throat: Uvula is midline, oropharynx is clear and moist and mucous membranes are normal.  Eyes: Conjunctivae and EOM are normal. Pupils are equal, round, and reactive to light.  Neck: Neck supple. No JVD present. No tracheal deviation present. No thyromegaly present.  Cardiovascular: Normal rate, regular rhythm, normal heart sounds and intact distal pulses.   Pulmonary/Chest: Effort normal and breath sounds normal.  Abdominal: Soft. Bowel sounds are  normal. She exhibits no distension and no mass. There is no tenderness. There is no rebound and no guarding.  Musculoskeletal: Normal range of motion. She exhibits no edema or tenderness.  Lymphadenopathy:    She has no cervical adenopathy.  Neurological: She is alert and oriented to person, place, and time. She has normal reflexes. No cranial nerve deficit. She exhibits normal muscle tone. Coordination normal.  Skin: Skin is warm and dry.  Psychiatric: She has a normal mood and affect. Her behavior is normal. Judgment and thought content normal.       Assessment & Plan:

## 2014-08-02 NOTE — Patient Instructions (Addendum)
Thank you for choosing [facility].  Summary/Instructions:  Please stop by the lab on the basement level of the building for your blood work. Your results will be released to MyChart (or called to you) after review, usually within 72hours after test completion. If any changes need to be made, you will be notified at that same time.  Health Maintenance Adopting a healthy lifestyle and getting preventive care can go a long way to promote health and wellness. Talk with your health care provider about what schedule of regular examinations is right for you. This is a good chance for you to check in with your provider about disease prevention and staying healthy. In between checkups, there are plenty of things you can do on your own. Experts have done a lot of research about which lifestyle changes and preventive measures are most likely to keep you healthy. Ask your health care provider for more information. WEIGHT AND DIET  Eat a healthy diet  Be sure to include plenty of vegetables, fruits, low-fat dairy products, and lean protein.  Do not eat a lot of foods high in solid fats, added sugars, or salt.  Get regular exercise. This is one of the most important things you can do for your health.  Most adults should exercise for at least 150 minutes each week. The exercise should increase your heart rate and make you sweat (moderate-intensity exercise).  Most adults should also do strengthening exercises at least twice a week. This is in addition to the moderate-intensity exercise.  Maintain a healthy weight  Body mass index (BMI) is a measurement that can be used to identify possible weight problems. It estimates body fat based on height and weight. Your health care provider can help determine your BMI and help you achieve or maintain a healthy weight.  For females 20 years of age and older:   A BMI below 18.5 is considered underweight.  A BMI of 18.5 to 24.9 is normal.  A BMI of 25  to 29.9 is considered overweight.  A BMI of 30 and above is considered obese.  Watch levels of cholesterol and blood lipids  You should start having your blood tested for lipids and cholesterol at 56 years of age, then have this test every 5 years.  You may need to have your cholesterol levels checked more often if:  Your lipid or cholesterol levels are high.  You are older than 56 years of age.  You are at high risk for heart disease.  CANCER SCREENING   Lung Cancer  Lung cancer screening is recommended for adults 55-80 years old who are at high risk for lung cancer because of a history of smoking.  A yearly low-dose CT scan of the lungs is recommended for people who:  Currently smoke.  Have quit within the past 15 years.  Have at least a 30-pack-year history of smoking. A pack year is smoking an average of one pack of cigarettes a day for 1 year.  Yearly screening should continue until it has been 15 years since you quit.  Yearly screening should stop if you develop a health problem that would prevent you from having lung cancer treatment.  Breast Cancer  Practice breast self-awareness. This means understanding how your breasts normally appear and feel.  It also means doing regular breast self-exams. Let your health care provider know about any changes, no matter how small.  If you are in your 20s or 30s, you should have a clinical breast exam (  breast exam (CBE) by a health care provider every 1-3 years as part of a regular health exam.  If you are 40 or older, have a CBE every year. Also consider having a breast X-ray (mammogram) every year.  If you have a family history of breast cancer, talk to your health care provider about genetic screening.  If you are at high risk for breast cancer, talk to your health care provider about having an MRI and a mammogram every year.  Breast cancer gene (BRCA) assessment is recommended for women who have family members with BRCA-related  cancers. BRCA-related cancers include:  Breast.  Ovarian.  Tubal.  Peritoneal cancers.  Results of the assessment will determine the need for genetic counseling and BRCA1 and BRCA2 testing. Cervical Cancer Routine pelvic examinations to screen for cervical cancer are no longer recommended for nonpregnant women who are considered low risk for cancer of the pelvic organs (ovaries, uterus, and vagina) and who do not have symptoms. A pelvic examination may be necessary if you have symptoms including those associated with pelvic infections. Ask your health care provider if a screening pelvic exam is right for you.   The Pap test is the screening test for cervical cancer for women who are considered at risk.  If you had a hysterectomy for a problem that was not cancer or a condition that could lead to cancer, then you no longer need Pap tests.  If you are older than 65 years, and you have had normal Pap tests for the past 10 years, you no longer need to have Pap tests.  If you have had past treatment for cervical cancer or a condition that could lead to cancer, you need Pap tests and screening for cancer for at least 20 years after your treatment.  If you no longer get a Pap test, assess your risk factors if they change (such as having a new sexual partner). This can affect whether you should start being screened again.  Some women have medical problems that increase their chance of getting cervical cancer. If this is the case for you, your health care provider may recommend more frequent screening and Pap tests.  The human papillomavirus (HPV) test is another test that may be used for cervical cancer screening. The HPV test looks for the virus that can cause cell changes in the cervix. The cells collected during the Pap test can be tested for HPV.  The HPV test can be used to screen women 30 years of age and older. Getting tested for HPV can extend the interval between normal Pap tests from  three to five years.  An HPV test also should be used to screen women of any age who have unclear Pap test results.  After 56 years of age, women should have HPV testing as often as Pap tests.  Colorectal Cancer  This type of cancer can be detected and often prevented.  Routine colorectal cancer screening usually begins at 56 years of age and continues through 56 years of age.  Your health care provider may recommend screening at an earlier age if you have risk factors for colon cancer.  Your health care provider may also recommend using home test kits to check for hidden blood in the stool.  A small camera at the end of a tube can be used to examine your colon directly (sigmoidoscopy or colonoscopy). This is done to check for the earliest forms of colorectal cancer.  Routine screening usually begins at age   50.  Direct examination of the colon should be repeated every 5-10 years through 56 years of age. However, you may need to be screened more often if early forms of precancerous polyps or small growths are found. Skin Cancer  Check your skin from head to toe regularly.  Tell your health care provider about any new moles or changes in moles, especially if there is a change in a mole's shape or color.  Also tell your health care provider if you have a mole that is larger than the size of a pencil eraser.  Always use sunscreen. Apply sunscreen liberally and repeatedly throughout the day.  Protect yourself by wearing long sleeves, pants, a wide-brimmed hat, and sunglasses whenever you are outside. HEART DISEASE, DIABETES, AND HIGH BLOOD PRESSURE   Have your blood pressure checked at least every 1-2 years. High blood pressure causes heart disease and increases the risk of stroke.  If you are between 70 years and 59 years old, ask your health care provider if you should take aspirin to prevent strokes.  Have regular diabetes screenings. This involves taking a blood sample to check  your fasting blood sugar level.  If you are at a normal weight and have a low risk for diabetes, have this test once every three years after 56 years of age.  If you are overweight and have a high risk for diabetes, consider being tested at a younger age or more often. PREVENTING INFECTION  Hepatitis B  If you have a higher risk for hepatitis B, you should be screened for this virus. You are considered at high risk for hepatitis B if:  You were born in a country where hepatitis B is common. Ask your health care provider which countries are considered high risk.  Your parents were born in a high-risk country, and you have not been immunized against hepatitis B (hepatitis B vaccine).  You have HIV or AIDS.  You use needles to inject street drugs.  You live with someone who has hepatitis B.  You have had sex with someone who has hepatitis B.  You get hemodialysis treatment.  You take certain medicines for conditions, including cancer, organ transplantation, and autoimmune conditions. Hepatitis C  Blood testing is recommended for:  Everyone born from [birth year] through 1967.  Anyone with known risk factors for hepatitis C. Sexually transmitted infections (STIs)  You should be screened for sexually transmitted infections (STIs) including gonorrhea and chlamydia if:  You are sexually active and are younger than 56 years of age.  You are older than 56 years of age and your health care provider tells you that you are at risk for this type of infection.  Your sexual activity has changed since you were last screened and you are at an increased risk for chlamydia or gonorrhea. Ask your health care provider if you are at risk.  If you do not have HIV, but are at risk, it may be recommended that you take a prescription medicine daily to prevent HIV infection. This is called pre-exposure prophylaxis (PrEP). You are considered at risk if:  You are sexually active and do not regularly use  condoms or know the HIV status of your partner(s).  You take drugs by injection.  You are sexually active with a partner who has HIV. Talk with your health care provider about whether you are at high risk of being infected with HIV. If you choose to begin PrEP, you should first be tested for HIV. You should  then be tested every 3 months for as long as you are taking PrEP.  PREGNANCY   If you are premenopausal and you may become pregnant, ask your health care provider about preconception counseling.  If you may become pregnant, take 400 to 800 micrograms (mcg) of folic acid every day.  If you want to prevent pregnancy, talk to your health care provider about birth control (contraception). OSTEOPOROSIS AND MENOPAUSE   Osteoporosis is a disease in which the bones lose minerals and strength with aging. This can result in serious bone fractures. Your risk for osteoporosis can be identified using a bone density scan.  If you are 53 years of age or older, or if you are at risk for osteoporosis and fractures, ask your health care provider if you should be screened.  Ask your health care provider whether you should take a calcium or vitamin D supplement to lower your risk for osteoporosis.  Menopause may have certain physical symptoms and risks.  Hormone replacement therapy may reduce some of these symptoms and risks. Talk to your health care provider about whether hormone replacement therapy is right for you.  HOME CARE INSTRUCTIONS   Schedule regular health, dental, and eye exams.  Stay current with your immunizations.   Do not use any tobacco products including cigarettes, chewing tobacco, or electronic cigarettes.  If you are pregnant, do not drink alcohol.  If you are breastfeeding, limit how much and how often you drink alcohol.  Limit alcohol intake to no more than 1 drink per day for nonpregnant women. One drink equals 12 ounces of beer, 5 ounces of wine, or 1 ounces of hard  liquor.  Do not use street drugs.  Do not share needles.  Ask your health care provider for help if you need support or information about quitting drugs.  Tell your health care provider if you often feel depressed.  Tell your health care provider if you have ever been abused or do not feel safe at home. Document Released: 12/04/2010 Document Revised: 10/05/2013 Document Reviewed: 04/22/2013 [facility] Patient Information 2015 [place], [place]. This information is not intended to replace advice given to you by your health care provider. Make sure you discuss any questions you have with your health care provider.

## 2014-08-03 ENCOUNTER — Encounter: Payer: Self-pay | Admitting: Family

## 2014-08-03 LAB — HIV ANTIBODY (ROUTINE TESTING W REFLEX): HIV: NONREACTIVE

## 2014-09-27 ENCOUNTER — Encounter: Payer: Self-pay | Admitting: Internal Medicine

## 2014-09-27 ENCOUNTER — Ambulatory Visit (INDEPENDENT_AMBULATORY_CARE_PROVIDER_SITE_OTHER): Payer: 59 | Admitting: Internal Medicine

## 2014-09-27 VITALS — BP 108/70 | HR 73 | Temp 98.3°F | Ht 63.0 in | Wt 113.5 lb

## 2014-09-27 DIAGNOSIS — J209 Acute bronchitis, unspecified: Secondary | ICD-10-CM | POA: Diagnosis not present

## 2014-09-27 DIAGNOSIS — J310 Chronic rhinitis: Secondary | ICD-10-CM

## 2014-09-27 MED ORDER — BENZONATATE 200 MG PO CAPS: 200.0000 mg | ORAL_CAPSULE | Freq: Three times a day (TID) | ORAL | Status: DC | PRN

## 2014-09-27 MED ORDER — AMOXICILLIN 500 MG PO CAPS: 500.0000 mg | ORAL_CAPSULE | Freq: Three times a day (TID) | ORAL | Status: DC

## 2014-09-27 MED ORDER — AZITHROMYCIN 250 MG PO TABS: ORAL_TABLET | ORAL | Status: DC

## 2014-09-27 NOTE — Patient Instructions (Signed)

## 2014-09-27 NOTE — Progress Notes (Signed)
   Subjective:    Patient ID: Katherine Vaughan, female    DOB: 20-Aug-1960, 54 y.o.   MRN: [id number]  HPI  Her symptoms began 09/21/14 as a sore throat. Over the next 48 hours the RTI progressed to a cough productive of yellow/green secretions. She's also had some pain in the right maxillary teeth. There is a much less volume of nasal purulence. She's been taking Tylenol with only partial relief.   Review of Systems  She denies frontal headache, facial pain, otic pain (some sensation of fullness noted ), otic discharge. She has no extrinsic symptoms of itchy/watery eyes. The cough is not associated with wheezing or shortness of breath. She's had no fever, chills, or sweats.       Objective:   Physical Exam  General appearance: Thin but adequately nourished; no acute distress or increased work of breathing is present but she appears fatigued..    Lymphatic: No  lymphadenopathy about the head, neck, or axilla .  Eyes: No conjunctival inflammation or lid edema is present. There is no scleral icterus.  Ears:  External ear exam shows no significant lesions or deformities.  Otoscopic examination reveals clear canals, tympanic membranes are intact bilaterally without bulging, retraction, inflammation or discharge.  Nose:  External nasal examination shows no deformity or inflammation. Marked erythema of the nasal septum. Some exudate on the left.No septal dislocation or deviation.No obstruction to airflow.   Oral exam: Dental hygiene is good; lips and gums are healthy appearing.There is no oropharyngeal erythema or exudate . Striking laryngitis.  Neck:  No deformities, thyromegaly, masses, or tenderness noted.   Supple with full range of motion without pain.   Heart:  Normal rate and regular rhythm. S1 and S2 normal without gallop, murmur, click, rub or other extra sounds.   Lungs:Chest clear to auscultation; no wheezes, rhonchi,rales ,or rubs present.  Extremities:  No cyanosis, edema, or  clubbing  noted    Skin: Warm & dry w/o tenting or jaundice. No significant lesions or rash.       Assessment & Plan:  #1 acute bronchitis w/o bronchospasm #2 rhinitis Plan: See orders and recommendations

## 2014-09-27 NOTE — Progress Notes (Signed)
Pre visit review using our clinic review tool, if applicable. No additional management support is needed unless otherwise documented below in the visit note. 

## 2014-09-28 ENCOUNTER — Telehealth: Payer: Self-pay | Admitting: Internal Medicine

## 2014-09-28 NOTE — Telephone Encounter (Signed)
PLEASE NOTE: All timestamps contained within this report are represented as [place] Standard Time. CONFIDENTIALTY NOTICE: This fax transmission is intended only for the addressee. It contains information that is legally privileged, confidential or otherwise protected from use or disclosure. If you are not the intended recipient, you are strictly prohibited from reviewing, disclosing, copying using or disseminating any of this information or taking any action in reliance on or regarding this information. If you have received this fax in error, please notify us immediately by telephone so that we can arrange for its return to [place]. Phone: [phone], Toll-Free: [phone], Fax: [phone] Page: 1 of 2 Call Id: [id number] [facility] [place] Patient Name: Katherine Vaughan Gender: Female DOB: 03/29/1961 Age: 54 Y 10 M 26 D Return Phone Number: [phone] (Primary) Address: City/State/Zip: [place] Client [place] [facility] - Client Client Site [place] - Day Physician [place], [place] Type Call Call Type Triage / Clinical Relationship To Patient Self Return Phone Number [phone] (Primary) Chief Complaint BREATHING - shortness of breath or sounds breathless Initial Comment Caller states saw dr yesterday, has bronchitis, bad coughing, feeling like she can't get any air in PreDisposition Call another nurse Nurse Assessment Nurse: Donalynn Furlong, RN, Myna Hidalgo Date/Time Eilene Ghazi Time): 09/28/2014 9:54:34 AM Confirm and document reason for call. If symptomatic, describe symptoms. ---Caller states saw dr yesterday, has bronchitis, bad coughing, feeling like she can't get any air in. Afebrile Has the patient traveled out of the country within the last 30 days? ---No Does the patient require triage? ---Yes Related visit to physician within the last 2 weeks? ---Yes Does the PT have any  chronic conditions? (i.e. diabetes, asthma, etc.) ---Yes List chronic conditions. ---Refux, Estrogen patch Guidelines Guideline Title Affirmed Question Affirmed Notes Nurse Date/Time Eilene Ghazi Time) Cough - Acute Productive Cough (all triage questions negative) Donalynn Furlong, RN, Myna Hidalgo 09/28/2014 9:57:25 AM Disp. Time Eilene Ghazi Time) Disposition Final User 09/28/2014 9:51:15 AM Send to Urgent Queue Fransico Michael 09/28/2014 10:04:29 AM Home Care Yes Donalynn Furlong, RN, Myna Hidalgo Caller Understands: Yes Disagree/Comply: Comply PLEASE NOTE: All timestamps contained within this report are represented as [place] Standard Time. CONFIDENTIALTY NOTICE: This fax transmission is intended only for the addressee. It contains information that is legally privileged, confidential or otherwise protected from use or disclosure. If you are not the intended recipient, you are strictly prohibited from reviewing, disclosing, copying using or disseminating any of this information or taking any action in reliance on or regarding this information. If you have received this fax in error, please notify us immediately by telephone so that we can arrange for its return to [place]. Phone: [phone], Toll-Free: [phone], Fax: [phone] Page: 2 of 2 Call Id: [id number] Care Advice Given Per Guideline HOME CARE: You should be able to treat this at home. REASSURANCE: * It doesn't sound like a serious cough. COUGH MEDICINES: - OTC COUGH SYRUPS: The most common cough suppressant in OTC cough medications is dextromethorphan. Often the letters 'DM' appear in the name. - OTC COUGH DROPS: Cough drops can help a lot, especially for mild coughs. They reduce coughing by soothing your irritated throat and removing that tickle sensation in the back of the throat. Cough drops also have the advantage of portability - you can carry them with you. - HOME REMEDY - HARD CANDY: Hard candy works just as well as medicine-flavored OTC cough drops.  Diabetics should use sugar-free candy. - HOME REMEDY -  HONEY: This old home remedy has been shown to help decrease coughing at night. The adult dosage is 2 teaspoons (10 ml) at bedtime. Honey should not be given to infants under one year of age. OTC COUGH SYRUP - DEXTROMETHORPHAN: * Cough syrups containing the cough suppressant dextromethorphan (DM) may help decrease your cough. Cough syrups work best for coughs that keep you awake at night. They can also sometimes help in the late stages of a respiratory infection when the cough is dry and hacking. They can be used along with cough drops. * Examples: Benylin, Robitussin DM, Vicks 44 Cough Relief * Read the package instructions for dosage, contraindications, and other important information. CAUTION - DEXTROMETHORPHAN: HUMIDIFIER: If the air is dry, use a humidifier in the bedroom. (Reason: dry air makes coughs worse) AVOID TOBACCO SMOKE: Smoking or being exposed to smoke makes coughs much worse. PREVENT DEHYDRATION: * Drink adequate liquids. * This will help soothe an irritated or dry throat and loosen up the phlegm. EXPECTED COURSE: Viral bronchitis causes a cough for 1 to 3 weeks. Sometimes you may cough up lots of phlegm (mucus). The mucus can normally be white, gray, yellow or green. CALL BACK IF: * Cough lasts over 3 weeks * Continuous coughing persists over 2 hours after cough treatment * Difficulty breathing occurs * Fever over 103 F (39.4 C) * Fever lasts over 3 days * You become worse. CARE ADVICE given per Cough - Acute Productive (Adult) guideline.

## 2014-09-29 ENCOUNTER — Telehealth: Payer: Self-pay | Admitting: *Deleted

## 2014-09-29 NOTE — Telephone Encounter (Signed)
[facility] Patient Name: Katherine Vaughan Gender: Female DOB: 1960/08/05 Age: 54 Y 10 M 26 D Return Phone Number: [phone] (Primary) Address: City/State/Zip: [place] Client [place] [facility] - Client Client Site [place] - Day Physician [place], [place] Type Call Call Type Triage / Clinical Relationship To Patient Self Appointment Disposition EMR Appointment Not Necessary Info pasted into Epic Yes Return Phone Number [phone] (Primary) Chief Complaint BREATHING - shortness of breath or sounds breathless Initial Comment Caller states saw dr yesterday, has bronchitis, bad coughing, feeling like she can't get any air in PreDisposition Call another nurse Nurse Assessment Nurse: Donalynn Furlong, RN, Myna Hidalgo Date/Time Eilene Ghazi Time): 09/28/2014 9:54:34 AM Confirm and document reason for call. If symptomatic, describe symptoms. ---Caller states saw dr yesterday, has bronchitis, bad coughing, feeling like she can't get any air in. Afebrile Has the patient traveled out of the country within the last 30 days? ---No Does the patient require triage? ---Yes Related visit to physician within the last 2 weeks? ---Yes Does the PT have any chronic conditions? (i.e. diabetes, asthma, etc.) ---Yes List chronic conditions. ---Refux, Estrogen patch Guidelines Guideline Title Affirmed Question Affirmed Notes Nurse Date/Time Eilene Ghazi Time) Cough - Acute Productive Cough (all triage questions negative) Donalynn Furlong, RN, Myna Hidalgo 09/28/2014 9:57:25 AM Disp. Time Eilene Ghazi Time) Disposition Final User 09/28/2014 9:51:15 AM Send to Urgent Queue Fransico Michael 09/28/2014 10:04:29 AM Home Care Yes Donalynn Furlong, RN, Myna Hidalgo Caller Understands: Yes Disagree/Comply: Comply PLEASE NOTE: All timestamps contained within this report are represented as [place] Standard Time. CONFIDENTIALTY NOTICE: This fax  transmission is intended only for the addressee. It contains information that is legally privileged, confidential or otherwise protected from use or disclosure. If you are not the intended recipient, you are strictly prohibited from reviewing, disclosing, copying using or disseminating any of this information or taking any action in reliance on or regarding this information. If you have received this fax in error, please notify us immediately by telephone so that we can arrange for its return to [place]. Phone: [phone], Toll-Free: [phone], Fax: [phone] Page: 2 of 2 Call Id: [id number] Care Advice Given Per Guideline HOME CARE: You should be able to treat this at home. REASSURANCE: * It doesn't sound like a serious cough. COUGH MEDICINES: - OTC COUGH SYRUPS: The most common cough suppressant in OTC cough medications is dextromethorphan. Often the letters 'DM' appear in the name. - OTC COUGH DROPS: Cough drops can help a lot, especially for mild coughs. They reduce coughing by soothing your irritated throat and removing that tickle sensation in the back of the throat. Cough drops also have the advantage of portability - you can carry them with you. - HOME REMEDY - HARD CANDY: Hard candy works just as well as medicine-flavored OTC cough drops. Diabetics should use sugar-free candy. - HOME REMEDY - HONEY: This old home remedy has been shown to help decrease coughing at night. The adult dosage is 2 teaspoons (10 ml) at bedtime. Honey should not be given to infants under one year of age. OTC COUGH SYRUP - DEXTROMETHORPHAN: * Cough syrups containing the cough suppressant dextromethorphan (DM) may help decrease your cough. Cough syrups work best for coughs that keep you awake at night. They can also sometimes help in the late stages of a respiratory infection when the cough is dry and hacking. They can be used along with cough drops. * Examples: Benylin, Robitussin DM,  Vicks 44 Cough Relief * Read  the package instructions for dosage, contraindications, and other important information. CAUTION - DEXTROMETHORPHAN: HUMIDIFIER: If the air is dry, use a humidifier in the bedroom. (Reason: dry air makes coughs worse) AVOID TOBACCO SMOKE: Smoking or being exposed to smoke makes coughs much worse. PREVENT DEHYDRATION: * Drink adequate liquids. * This will help soothe an irritated or dry throat and loosen up the phlegm. EXPECTED COURSE: Viral bronchitis causes a cough for 1 to 3 weeks. Sometimes you may cough up lots of phlegm (mucus). The mucus can normally be white, gray, yellow or green. CALL BACK IF: * Cough lasts over 3 weeks * Continuous coughing persists over 2 hours after cough treatment * Difficulty breathing occurs * Fever over 103 F (39.4 C) * Fever lasts over 3 days * You become worse. CARE ADVICE given per Cough - Acute Productive (Adult) guideline. After Care Instructions Given Call Event Type User Date / Time Description

## 2014-10-05 ENCOUNTER — Other Ambulatory Visit (HOSPITAL_COMMUNITY): Payer: Self-pay | Admitting: Dermatology

## 2014-12-11 ENCOUNTER — Emergency Department (HOSPITAL_BASED_OUTPATIENT_CLINIC_OR_DEPARTMENT_OTHER)
Admission: EM | Admit: 2014-12-11 | Discharge: 2014-12-12 | Disposition: A | Discharge status: Home / Self Care | Payer: 59 | Attending: Emergency Medicine | Admitting: Emergency Medicine

## 2014-12-11 ENCOUNTER — Encounter (HOSPITAL_BASED_OUTPATIENT_CLINIC_OR_DEPARTMENT_OTHER): Payer: Self-pay | Admitting: Emergency Medicine

## 2014-12-11 ENCOUNTER — Emergency Department (HOSPITAL_BASED_OUTPATIENT_CLINIC_OR_DEPARTMENT_OTHER): Payer: 59

## 2014-12-11 DIAGNOSIS — Z86010 Personal history of colonic polyps: Secondary | ICD-10-CM | POA: Diagnosis not present

## 2014-12-11 DIAGNOSIS — Y9289 Other specified places as the place of occurrence of the external cause: Secondary | ICD-10-CM | POA: Diagnosis not present

## 2014-12-11 DIAGNOSIS — S92512A Displaced fracture of proximal phalanx of left lesser toe(s), initial encounter for closed fracture: Secondary | ICD-10-CM | POA: Insufficient documentation

## 2014-12-11 DIAGNOSIS — Y9389 Activity, other specified: Secondary | ICD-10-CM | POA: Diagnosis not present

## 2014-12-11 DIAGNOSIS — Z8679 Personal history of other diseases of the circulatory system: Secondary | ICD-10-CM | POA: Diagnosis not present

## 2014-12-11 DIAGNOSIS — Z8739 Personal history of other diseases of the musculoskeletal system and connective tissue: Secondary | ICD-10-CM | POA: Diagnosis not present

## 2014-12-11 DIAGNOSIS — W231XXA Caught, crushed, jammed, or pinched between stationary objects, initial encounter: Secondary | ICD-10-CM | POA: Insufficient documentation

## 2014-12-11 DIAGNOSIS — S92912A Unspecified fracture of left toe(s), initial encounter for closed fracture: Secondary | ICD-10-CM

## 2014-12-11 DIAGNOSIS — Z79899 Other long term (current) drug therapy: Secondary | ICD-10-CM | POA: Diagnosis not present

## 2014-12-11 DIAGNOSIS — Y998 Other external cause status: Secondary | ICD-10-CM | POA: Insufficient documentation

## 2014-12-11 DIAGNOSIS — Z8719 Personal history of other diseases of the digestive system: Secondary | ICD-10-CM | POA: Diagnosis not present

## 2014-12-11 DIAGNOSIS — S99922A Unspecified injury of left foot, initial encounter: Secondary | ICD-10-CM | POA: Diagnosis present

## 2014-12-11 DIAGNOSIS — Z87442 Personal history of urinary calculi: Secondary | ICD-10-CM | POA: Diagnosis not present

## 2014-12-11 MED ORDER — IBUPROFEN 600 MG PO TABS: 600.0000 mg | ORAL_TABLET | Freq: Three times a day (TID) | ORAL | Status: DC | PRN

## 2014-12-11 NOTE — ED Provider Notes (Signed)
CSN: [id number]     Arrival date & time 12/11/14  2217 History  This chart was scribed for  Jola Schmidt, MD by Altamease Oiler, ED Scribe. This patient was seen in room MH12/MH12 and the patient's care was started at 11:37 PM.   Chief Complaint  Patient presents with  . Toe Injury   Patient is a 54 y.o. female presenting with toe pain. The history is provided by the patient. No language interpreter was used.  Toe Pain This is a new problem. The current episode started 3 to 5 hours ago. The problem occurs constantly. The problem has not changed since onset.Katherine Vaughan has tried nothing for the symptoms.   Katherine Vaughan is a 54 y.o. female who presents to the Emergency Department complaining of constant pain in the left fifth toe with sudden onset around 9:15 PM after striking it on a metal picnic table. Her toe went a different way than her foot and Katherine Vaughan has been unable to straighten it. The pain is rated 5/10 in severity and described as aching. Katherine Vaughan took nothing for pain PTA. The last orthopedic specialist Katherine Vaughan saw was saw Dr. Edmonia Lynch at [facility].    Past Medical History  Diagnosis Date  . GERD (gastroesophageal reflux disease)   . History of kidney stones   . Migraines   . History of colon polyps 2006    adenomatous, no polyps in 2011  . Urine incontinence   . Osteopenia     dexa 08/2010: -2.3 L fem   Past Surgical History  Procedure Laterality Date  . Breast surgery      breast biopsy x's 2 (L) 92 and (R) 98  . Abdominal hysterectomy  2006  . Septoplasty    . Laser endrometriosis     Family History  Problem Relation Age of Onset  . Arthritis Mother   . Diverticulitis Mother   . Arthritis Father   . Diabetes Other   . Heart disease Other   . Hyperlipidemia Other   . Hypertension Other   . Stroke Other   . Breast cancer Sister 75    kidney cancer at age 85  . Diverticulitis Sister 67    s/p colectomy  . Diverticulitis Brother   . Pancreatic cancer Paternal Uncle 24   . Stomach cancer Paternal Uncle 1  . Ovarian cancer Other 61    mat great aunt through [facility] with ovarian   History  Substance Use Topics  . Smoking status: Never Smoker   . Smokeless tobacco: Never Used  . Alcohol Use: No   OB History    No data available     Review of Systems 10 Systems reviewed and all are negative for acute change except as noted in the HPI.     Allergies  Demerol; Percocet; and Sulfa antibiotics  Home Medications   Prior to Admission medications   Medication Sig Start Date End Date Taking? Authorizing Provider  ampicillin (PRINCIPEN) 500 MG capsule Take 500 mg by mouth every other day.      Historical Provider, MD  azithromycin (ZITHROMAX Z-PAK) 250 MG tablet 2 day 1, then 1 qd 09/27/14   Hendricks Limes, MD  benzonatate (TESSALON) 200 MG capsule Take 1 capsule (200 mg total) by mouth 3 (three) times daily as needed for cough. 09/27/14   Hendricks Limes, MD  Calcium Carbonate-Vitamin D (CALCIUM + D PO) Take 600 mg by mouth 2 (two) times daily.      Historical Provider,  MD  Cholecalciferol (VITAMIN D3) 2000 UNITS TABS Take by mouth daily.      Historical Provider, MD  esomeprazole (NEXIUM) 40 MG capsule Take 40 mg by mouth daily.      Historical Provider, MD  estradiol (VIVELLE-DOT) 0.075 MG/24HR Place 1 patch onto the skin 2 (two) times a week.    Historical Provider, MD  Multiple Vitamin (MULTIVITAMIN) tablet Take 1 tablet by mouth daily.      Historical Provider, MD  Omega-3 Fatty Acids (FISH OIL) 1200 MG CAPS Take by mouth 2 (two) times daily.      Historical Provider, MD  vitamin C (ASCORBIC ACID) 500 MG tablet Take 500 mg by mouth daily.      Historical Provider, MD   BP 105/58 mmHg  Pulse 68  Temp(Src) 98.2 F (36.8 C) (Oral)  Resp 18  SpO2 100% Physical Exam  Constitutional: Katherine Vaughan is oriented to person, place, and time. Katherine Vaughan appears well-developed and well-nourished.  HENT:  Head: Normocephalic.  Eyes: EOM are normal.  Neck: Normal range of  motion.  Pulmonary/Chest: Effort normal.  Abdominal: Katherine Vaughan exhibits no distension.  Musculoskeletal: Normal range of motion.  Bruising and swelling and tenderness of the proximal phalanx of the left little toe  Neurological: Katherine Vaughan is alert and oriented to person, place, and time.  Psychiatric: Katherine Vaughan has a normal mood and affect.  Nursing note and vitals reviewed.   ED Course  Procedures  DIAGNOSTIC STUDIES: Oxygen Saturation is 100% on RA, normal by my interpretation.    COORDINATION OF CARE: 11:39 PM Discussed treatment plan which includes XR of left 5th toe and immobilzation with a post-operative shoe with pt at bedside and pt agreed to plan.  Labs Review Labs Reviewed - No data to display  Imaging Review Dg Toe 5th Left  12/12/2014   CLINICAL DATA:  Jammed left fifth toe on metal picnic table, with pain. Initial encounter.  EXAM: DG TOE 5TH LEFT  COMPARISON:  None.  FINDINGS: There is a a mildly comminuted oblique fracture extending across the fifth proximal phalanx, with suggestion of intra-articular extension distally.  There is mild lateral and plantar angulation. Overlying soft tissue swelling is noted. Visualized joint spaces are otherwise preserved.  IMPRESSION: Mildly comminuted oblique fracture extending across the fifth proximal phalanx, with mild lateral and plantar angulation, and suggestion of intra-articular extension distally.   Electronically Signed   By: Garald Balding M.D.   On: 12/12/2014 00:02     EKG Interpretation None      MDM   Final diagnoses:  Toe fracture, left, closed, initial encounter    Postop shoe, orthopedic follow-up.  Anti-inflammatories.   I personally performed the services described in this documentation, which was scribed in my presence. The recorded information has been reviewed and is accurate.        Jola Schmidt, MD 12/12/14 [phone]

## 2014-12-11 NOTE — Discharge Instructions (Signed)

## 2014-12-11 NOTE — ED Notes (Addendum)
Pt in c/o L 5th toe pain after jamming it on a metal picnic table. Toe is visibly crooked toward the lateral side compared to the R toe.

## 2014-12-27 ENCOUNTER — Other Ambulatory Visit: Payer: Self-pay | Admitting: Gastroenterology

## 2014-12-29 NOTE — Addendum Note (Signed)
Addended byClarene Essex on: 12/29/2014 04:29 PM   Modules accepted: Orders

## 2015-02-04 ENCOUNTER — Other Ambulatory Visit: Payer: Self-pay | Admitting: Gastroenterology

## 2015-02-05 NOTE — Addendum Note (Signed)
Addended byClarene Essex on: 02/05/2015 08:13 AM   Modules accepted: Orders

## 2015-02-09 ENCOUNTER — Encounter (HOSPITAL_COMMUNITY): Payer: Self-pay | Admitting: *Deleted

## 2015-02-11 ENCOUNTER — Ambulatory Visit (HOSPITAL_COMMUNITY): Payer: 59 | Admitting: Anesthesiology

## 2015-02-11 ENCOUNTER — Encounter (HOSPITAL_COMMUNITY): Payer: Self-pay

## 2015-02-11 ENCOUNTER — Encounter (HOSPITAL_COMMUNITY)
Admission: RE | Disposition: A | Discharge status: Home / Self Care | Payer: Self-pay | Source: Ambulatory Visit | Attending: Gastroenterology

## 2015-02-11 ENCOUNTER — Ambulatory Visit (HOSPITAL_COMMUNITY)
Admission: RE | Admit: 2015-02-11 | Discharge: 2015-02-11 | Disposition: A | Discharge status: Home / Self Care | Payer: 59 | Source: Ambulatory Visit | Attending: Gastroenterology | Admitting: Gastroenterology

## 2015-02-11 DIAGNOSIS — Z86010 Personal history of colonic polyps: Secondary | ICD-10-CM | POA: Insufficient documentation

## 2015-02-11 DIAGNOSIS — K2950 Unspecified chronic gastritis without bleeding: Secondary | ICD-10-CM | POA: Diagnosis not present

## 2015-02-11 DIAGNOSIS — Z8371 Family history of colonic polyps: Secondary | ICD-10-CM | POA: Diagnosis not present

## 2015-02-11 DIAGNOSIS — K640 First degree hemorrhoids: Secondary | ICD-10-CM | POA: Insufficient documentation

## 2015-02-11 DIAGNOSIS — M1990 Unspecified osteoarthritis, unspecified site: Secondary | ICD-10-CM | POA: Diagnosis not present

## 2015-02-11 DIAGNOSIS — Z8719 Personal history of other diseases of the digestive system: Secondary | ICD-10-CM | POA: Diagnosis not present

## 2015-02-11 DIAGNOSIS — Z1211 Encounter for screening for malignant neoplasm of colon: Secondary | ICD-10-CM | POA: Diagnosis not present

## 2015-02-11 DIAGNOSIS — K317 Polyp of stomach and duodenum: Secondary | ICD-10-CM | POA: Diagnosis not present

## 2015-02-11 DIAGNOSIS — K449 Diaphragmatic hernia without obstruction or gangrene: Secondary | ICD-10-CM | POA: Diagnosis not present

## 2015-02-11 HISTORY — DX: Unspecified osteoarthritis, unspecified site: M19.90

## 2015-02-11 HISTORY — PX: COLONOSCOPY WITH PROPOFOL: SHX5780

## 2015-02-11 HISTORY — DX: Other specified postprocedural states: Z98.890

## 2015-02-11 HISTORY — DX: Nausea with vomiting, unspecified: R11.2

## 2015-02-11 HISTORY — DX: Personal history of other diseases of the digestive system: Z87.19

## 2015-02-11 HISTORY — PX: ESOPHAGOGASTRODUODENOSCOPY: SHX5428

## 2015-02-11 SURGERY — COLONOSCOPY WITH PROPOFOL
Anesthesia: Monitor Anesthesia Care

## 2015-02-11 MED ORDER — BUTAMBEN-TETRACAINE-BENZOCAINE 2-2-14 % EX AERO
INHALATION_SPRAY | CUTANEOUS | Status: DC | PRN
Start: 2015-02-11 — End: 2015-02-11
  Administered 2015-02-11: 2 via TOPICAL

## 2015-02-11 MED ORDER — LACTATED RINGERS IV SOLN
INTRAVENOUS | Status: DC | PRN
  Administered 2015-02-11: 09:00:00 via INTRAVENOUS

## 2015-02-11 MED ORDER — LIDOCAINE HCL (CARDIAC) 20 MG/ML IV SOLN
INTRAVENOUS | Status: DC | PRN
  Administered 2015-02-11: 40 mg via INTRAVENOUS

## 2015-02-11 MED ORDER — SODIUM CHLORIDE 0.9 % IV SOLN: INTRAVENOUS | Status: DC

## 2015-02-11 MED ORDER — PROPOFOL 10 MG/ML IV BOLUS
INTRAVENOUS | Status: DC | PRN
  Administered 2015-02-11: 30 mg via INTRAVENOUS
  Administered 2015-02-11: 20 mg via INTRAVENOUS
  Administered 2015-02-11: 50 mg via INTRAVENOUS

## 2015-02-11 MED ORDER — PROPOFOL INFUSION 10 MG/ML OPTIME
INTRAVENOUS | Status: DC | PRN
  Administered 2015-02-11: 75 ug/kg/min via INTRAVENOUS

## 2015-02-11 MED ORDER — MIDAZOLAM HCL 5 MG/5ML IJ SOLN
INTRAMUSCULAR | Status: DC | PRN
  Administered 2015-02-11: 1 mg via INTRAVENOUS

## 2015-02-11 NOTE — Anesthesia Preprocedure Evaluation (Addendum)
Anesthesia Evaluation  Patient identified by MRN, date of birth, ID band Patient awake    Reviewed: Allergy & Precautions, NPO status , Patient's Chart, lab work & pertinent test results  History of Anesthesia Complications (+) PONV  Airway Mallampati: I  TM Distance: >3 FB Neck ROM: Full    Dental   Pulmonary neg pulmonary ROS,    breath sounds clear to auscultation       Cardiovascular negative cardio ROS   Rhythm:Regular Rate:Normal     Neuro/Psych negative neurological ROS     GI/Hepatic Neg liver ROS, hiatal hernia, GERD  ,  Endo/Other  negative endocrine ROS  Renal/GU negative Renal ROS     Musculoskeletal  (+) Arthritis ,   Abdominal   Peds  Hematology negative hematology ROS (+)   Anesthesia Other Findings   Reproductive/Obstetrics                            Anesthesia Physical Anesthesia Plan  ASA: II  Anesthesia Plan: MAC   Post-op Pain Management:    Induction: Intravenous  Airway Management Planned: Natural Airway, Nasal Cannula and Simple Face Mask  Additional Equipment:   Intra-op Plan:   Post-operative Plan:   Informed Consent: I have reviewed the patients History and Physical, chart, labs and discussed the procedure including the risks, benefits and alternatives for the proposed anesthesia with the patient or authorized representative who has indicated his/her understanding and acceptance.     Plan Discussed with: CRNA  Anesthesia Plan Comments:         Anesthesia Quick Evaluation

## 2015-02-11 NOTE — Op Note (Signed)
[facility] [place], [id number]   COLONOSCOPY PROCEDURE REPORT     EXAM DATE: 02/11/2015  PATIENT NAME:      Katherine, Vaughan           MR #:      [id number]  BIRTHDATE:       04-30-1961      VISIT #:     [id number]  ATTENDING:     Clarene Essex, MD     STATUS:     outpatient ASSISTANT:      Jiles Harold and [employer] Plumber, [place]   INDICATIONS:  The patient is a 54 yr old female here for a colonoscopy due to high risk patient with personal history of colonic polyps and patient's family history of colon polyps. PROCEDURE PERFORMED:     Colonoscopy, screening MEDICATIONS:     Propofol 170 mg IV and Versed 1 mg IV  lidocaine 40 mg ESTIMATED BLOOD LOSS:     None  CONSENT: The patient understands the risks and benefits of the procedure and understands that these risks include, but are not limited to: sedation, allergic reaction, infection, perforation and/or bleeding. Alternative means of evaluation and treatment include, among others: physical exam, x-rays, and/or surgical intervention. The patient elects to proceed with this endoscopic procedure.  DESCRIPTION OF PROCEDURE: During intra-op preparation period all mechanical & medical equipment was checked for proper function. Hand hygiene and appropriate measures for infection prevention was taken. After the risks, benefits and alternatives of the procedure were thoroughly explained, Informed consent was verified, confirmed and timeout was successfully executed by the treatment team. A digital exam revealed no abnormalities of the rectum. The Pentax Ped Colon [id number] endoscope was introduced through the anus and advanced to the terminal ileum which was intubated for a short distance.the prep was adequate The instrument was then slowly withdrawn as the colon was fully examined.Estimated blood loss is zero unless otherwise noted in this procedure report. the findings are recorded below       Retroflexed views revealed internal Grade I hemorrhoids. The scope was then completely withdrawn from the patient and the procedure terminated. SCOPE WITHDRAWAL TIME: per nurse's note    ADVERSE EVENTS:      There were no immediate complications.  IMPRESSIONS:     1. Small internal hemorrhoids 2. Otherwise within normal limits to the terminal ileum  RECOMMENDATIONS:     proceed with endoscopy follow-up when necessary recheck colon screening 5 years RECALL:     5 years or as needed  _____________________________ Clarene Essex, MD eSigned:  Clarene Essex, MD 02/11/2015 9:57 AM   cc:  Gwendolyn Grant, M.D.   CPT CODES: ICD CODES:  The ICD and CPT codes recommended by this software are interpretations from the data that the clinical staff has captured with the software.  The verification of the translation of this report to the ICD and CPT codes and modifiers is the sole responsibility of the health care institution and practicing physician where this report was generated.  [email or web address] will not be held responsible for the validity of the ICD and CPT codes included on this report.  AMA assumes no liability for data contained or not contained herein. CPT is a Designer, television/film set of the [employer].

## 2015-02-11 NOTE — Discharge Instructions (Addendum)
Conscious Sedation, Adult, Care After Refer to this sheet in the next few weeks. These instructions provide you with information on caring for yourself after your procedure. Your health care provider may also give you more specific instructions. Your treatment has been planned according to current medical practices, but problems sometimes occur. Call your health care provider if you have any problems or questions after your procedure. WHAT TO EXPECT AFTER THE PROCEDURE  After your procedure:  You may feel sleepy, clumsy, and have poor balance for several hours.  Vomiting may occur if you eat too soon after the procedure. HOME CARE INSTRUCTIONS  Do not participate in any activities where you could become injured for at least 24 hours. Do not:  Drive.  Swim.  Ride a bicycle.  Operate heavy machinery.  Cook.  Use power tools.  Climb ladders.  Work from a high place.  Do not make important decisions or sign legal documents until you are improved.  If you vomit, drink water, juice, or soup when you can drink without vomiting. Make sure you have little or no nausea before eating solid foods.  Only take over-the-counter or prescription medicines for pain, discomfort, or fever as directed by your health care provider.  Make sure you and your family fully understand everything about the medicines given to you, including what side effects may occur.  You should not drink alcohol, take sleeping pills, or take medicines that cause drowsiness for at least 24 hours.  If you smoke, do not smoke without supervision.  If you are feeling better, you may resume normal activities 24 hours after you were sedated.  Keep all appointments with your health care provider. SEEK MEDICAL CARE IF:  Your skin is pale or bluish in color.  You continue to feel nauseous or vomit.  Your pain is getting worse and is not helped by medicine.  You have bleeding or swelling.  You are still sleepy or  feeling clumsy after 24 hours. SEEK IMMEDIATE MEDICAL CARE IF:  You develop a rash.  You have difficulty breathing.  You develop any type of allergic problem.  You have a fever. MAKE SURE YOU:  Understand these instructions.  Will watch your condition.  Will get help right away if you are not doing well or get worse. Document Released: 03/11/2013 Document Reviewed: 03/11/2013 [facility] Patient Information 2015 [place], [place]. This information is not intended to replace advice given to you by your health care provider. Make sure you discuss any questions you have with your health care provider.  Gastrointestinal Endoscopy, Care After Refer to this sheet in the next few weeks. These instructions provide you with information on caring for yourself after your procedure. Your caregiver may also give you more specific instructions. Your treatment has been planned according to current medical practices, but problems sometimes occur. Call your caregiver if you have any problems or questions after your procedure. HOME CARE INSTRUCTIONS  If you were given medicine to help you relax (sedative), do not drive, operate machinery, or sign important documents for 24 hours.  Avoid alcohol and hot or warm beverages for the first 24 hours after the procedure.  Only take over-the-counter or prescription medicines for pain, discomfort, or fever as directed by your caregiver. You may resume taking your normal medicines unless your caregiver tells you otherwise. Ask your caregiver when you may resume taking medicines that may cause bleeding, such as aspirin, clopidogrel, or warfarin.  You may return to your normal diet and activities on  the day after your procedure, or as directed by your caregiver. Walking may help to reduce any bloated feeling in your abdomen.  Drink enough fluids to keep your urine clear or pale yellow.  You may gargle with salt water if you have a sore throat. SEEK IMMEDIATE MEDICAL  CARE IF:  You have severe nausea or vomiting.  You have severe abdominal pain, abdominal cramps that last longer than 6 hours, or abdominal swelling (distention).  You have severe shoulder or back pain.  You have trouble swallowing.  You have shortness of breath, your breathing is shallow, or you are breathing faster than normal.  You have a fever or a rapid heartbeat.  You vomit blood or material that looks like coffee grounds.  You have bloody, black, or tarry stools. MAKE SURE YOU:  Understand these instructions.  Will watch your condition.  Will get help right away if you are not doing well or get worse. Document Released: 01/03/2004 Document Revised: 10/05/2013 Document Reviewed: 08/21/2011 [facility] Patient Information 2015 [place], [place]. This information is not intended to replace advice given to you by your health care provider. Make sure you discuss any questions you have with your health care provider.  Call if question or problem or for biopsy results in one week and follow-up as needed or in one year per our routine

## 2015-02-11 NOTE — Transfer of Care (Signed)
Immediate Anesthesia Transfer of Care Note  Patient: Katherine Vaughan  Procedure(s) Performed: Procedure(s): COLONOSCOPY WITH PROPOFOL (N/A) ESOPHAGOGASTRODUODENOSCOPY (EGD) (N/A)  Patient Location: PACU  Anesthesia Type:MAC  Level of Consciousness: awake, alert  and oriented  Airway & Oxygen Therapy: Patient Spontanous Breathing  Post-op Assessment: Report given to RN, Patient moving all extremities and Patient moving all extremities X 4  Post vital signs: Reviewed and stable  Last Vitals:  Filed Vitals:   02/11/15 1003  BP: 94/52  Pulse: 70  Temp: 36.4 C  Resp:     Complications: No apparent anesthesia complications

## 2015-02-11 NOTE — Op Note (Signed)
[facility] [place], [address]   ENDOSCOPY PROCEDURE REPORT  PATIENT: Kyriaki, Moder  MR#: [id number] BIRTHDATE: May 19, 1961 , 29  yrs. old GENDER: female ENDOSCOPIST: Clarene Essex, MD REFERRED BY: PROCEDURE DATE:  02/11/2015 PROCEDURE:  EGD w/ biopsy ASA CLASS:     Class I INDICATIONS:  therapy of esophageal reflux. MEDICATIONS: Propofol 70 mg IV TOPICAL ANESTHETIC: Cetacaine Spray  DESCRIPTION OF PROCEDURE: After the risks benefits and alternatives of the procedure were thoroughly explained, informed consent was obtained.  The Pentax Gastroscope [id number] endoscope was introduced through the mouth and advanced to the second portion of the duodenum , Without limitations.  The instrument was slowly withdrawn as the mucosa was fully examined. Estimated blood loss is zero unless otherwise noted in this procedure report.    the findings are recorded below       Retroflexed views revealed a hiatal hernia.     The scope was then withdrawn from the patient and the procedure completed.  COMPLICATIONS: There were no immediate complications.  ENDOSCOPIC IMPRESSION: 1. Small hiatal hernia 2. Few small gastric polyps status post biopsy 3. Otherwise within normal limits to the second portion of the duodenum  RECOMMENDATIONS: continue present management call me when necessary follow-up when necessary  REPEAT EXAM: as needed  eSigned:  Clarene Essex, MD 02/11/2015 10:01 AM    NU:[id number] A Asa Lente, MD  CPT CODES: ICD CODES:  The ICD and CPT codes recommended by this software are interpretations from the data that the clinical staff has captured with the software.  The verification of the translation of this report to the ICD and CPT codes and modifiers is the sole responsibility of the health care institution and practicing physician where this report was generated.  [employer] will not be held responsible for the  validity of the ICD and CPT codes included on this report.  AMA assumes no liability for data contained or not contained herein. CPT is a Designer, television/film set of the [employer].  PATIENT NAME:  Deirdra, Heumann MR#: [id number]

## 2015-02-11 NOTE — Anesthesia Postprocedure Evaluation (Signed)
  Anesthesia Post-op Note  Patient: Katherine Vaughan  Procedure(s) Performed: Procedure(s): COLONOSCOPY WITH PROPOFOL (N/A) ESOPHAGOGASTRODUODENOSCOPY (EGD) (N/A)  Patient Location: PACU  Anesthesia Type:MAC  Level of Consciousness: awake and alert   Airway and Oxygen Therapy: Patient Spontanous Breathing  Post-op Pain: none  Post-op Assessment: Post-op Vital signs reviewed              Post-op Vital Signs: Reviewed  Last Vitals:  Filed Vitals:   02/11/15 1030  BP: 96/57  Pulse: 64  Temp:   Resp: 12    Complications: No apparent anesthesia complications

## 2015-02-11 NOTE — Progress Notes (Signed)
Deon Pilling 9:00 AM  Subjective: Patient doing well without any new complaints since we last saw her in the office and no new medical problems  Objective: Vital signs stable afebrile no acute distress exam please see preassessment evaluation  Assessment: Personal history of colon polyps family history of colon polyps reflux  Plan: Okay to proceed with colonoscopy and endoscopy today with anesthesia assistance  [facility]  Pager [phone] After 5PM or if no answer call [phone]

## 2015-02-15 ENCOUNTER — Encounter (HOSPITAL_COMMUNITY): Payer: Self-pay | Admitting: Gastroenterology

## 2015-03-25 ENCOUNTER — Encounter: Payer: Self-pay | Admitting: Internal Medicine

## 2015-03-25 ENCOUNTER — Other Ambulatory Visit: Payer: Self-pay

## 2015-03-25 DIAGNOSIS — Z1231 Encounter for screening mammogram for malignant neoplasm of breast: Secondary | ICD-10-CM

## 2015-05-10 ENCOUNTER — Ambulatory Visit
Admission: RE | Admit: 2015-05-10 | Discharge: 2015-05-10 | Disposition: A | Discharge status: Home / Self Care | Payer: 59 | Source: Ambulatory Visit

## 2015-05-10 DIAGNOSIS — Z1231 Encounter for screening mammogram for malignant neoplasm of breast: Secondary | ICD-10-CM

## 2015-05-13 ENCOUNTER — Other Ambulatory Visit (HOSPITAL_COMMUNITY): Payer: Self-pay | Admitting: Obstetrics and Gynecology

## 2015-05-13 DIAGNOSIS — Z803 Family history of malignant neoplasm of breast: Secondary | ICD-10-CM

## 2015-06-01 ENCOUNTER — Ambulatory Visit (HOSPITAL_COMMUNITY)
Admission: RE | Admit: 2015-06-01 | Discharge: 2015-06-01 | Disposition: A | Discharge status: Home / Self Care | Payer: 59 | Source: Ambulatory Visit | Attending: Obstetrics and Gynecology | Admitting: Obstetrics and Gynecology

## 2015-06-01 DIAGNOSIS — Z803 Family history of malignant neoplasm of breast: Secondary | ICD-10-CM | POA: Insufficient documentation

## 2015-06-01 MED ORDER — GADOBENATE DIMEGLUMINE 529 MG/ML IV SOLN
10.0000 mL | Freq: Once | INTRAVENOUS | Status: AC | PRN
  Administered 2015-06-01: 10 mL via INTRAVENOUS

## 2015-06-22 DIAGNOSIS — H524 Presbyopia: Secondary | ICD-10-CM | POA: Diagnosis not present

## 2015-07-18 MED FILL — ESTRADIOL 0.075 MG PATCH: 0.075 | 84 days supply | Qty: 24 | Fill #1

## 2015-07-18 MED FILL — ESOMEPRAZOLE MAG DR 40 MG C: 40 | 90 days supply | Qty: 90 | Fill #1

## 2015-08-01 ENCOUNTER — Ambulatory Visit (INDEPENDENT_AMBULATORY_CARE_PROVIDER_SITE_OTHER): Payer: 59 | Admitting: Internal Medicine

## 2015-08-01 ENCOUNTER — Encounter: Payer: Self-pay | Admitting: Internal Medicine

## 2015-08-01 VITALS — BP 112/68 | HR 72 | Temp 97.9°F | Resp 16 | Wt 112.0 lb

## 2015-08-01 DIAGNOSIS — Z0000 Encounter for general adult medical examination without abnormal findings: Secondary | ICD-10-CM

## 2015-08-01 DIAGNOSIS — L709 Acne, unspecified: Secondary | ICD-10-CM | POA: Insufficient documentation

## 2015-08-01 DIAGNOSIS — K219 Gastro-esophageal reflux disease without esophagitis: Secondary | ICD-10-CM

## 2015-08-01 DIAGNOSIS — M8580 Other specified disorders of bone density and structure, unspecified site: Secondary | ICD-10-CM

## 2015-08-01 NOTE — Progress Notes (Signed)
Subjective:    Patient ID: Katherine Vaughan, female    DOB: [birth year], 55 y.o.   MRN: [id number]  HPI She is here to establish with a new pcp.   GERD:  She is taking her medication daily as prescribed.  She denies any GERD symptoms and feels her GERD is well controlled. She does have GERD on occasion even with daily nexium.   Osteopenia:  She had osteoporosis and took fosamax for 5 years.  She is taking calcium and vitamin D daily.  She is exercising on a regular basis - she walks daily.  Her last dexa scan was in 2014 - it is done through her gyn.    Had back pain by bra line and radiates through her chest. She is intermittent.  She takes tylenol or advil, which helps.  It eventually goes away.  No pattern to pain.  Occurs every few weeks.  Sometimes it can last hours.    Medications and allergies reviewed with patient and updated if appropriate.  Patient Active Problem List   Diagnosis Date Noted  . Acne 08/01/2015  . Family history of malignant neoplasm of breast 01/04/2014  . Family history of malignant neoplasm of gastrointestinal tract 01/04/2014  . Family history of malignant neoplasm of ovary 01/04/2014  . Vitamin D deficiency disease 07/04/2011  . Osteopenia   . GERD (gastroesophageal reflux disease)     Current Outpatient Prescriptions on File Prior to Visit  Medication Sig Dispense Refill  . ampicillin (PRINCIPEN) 500 MG capsule Take 500 mg by mouth every other day.      . Ascorbic Acid (VITAMIN C) 1000 MG tablet Take 1,000 mg by mouth daily.    . Calcium Carbonate-Vitamin D (CALCIUM + D PO) Take 600 mg by mouth 2 (two) times daily.      . Cholecalciferol (VITAMIN D3) 2000 UNITS TABS Take by mouth daily.      Marland Kitchen esomeprazole (NEXIUM) 40 MG capsule Take 40 mg by mouth daily.      Marland Kitchen estradiol (VIVELLE-DOT) 0.075 MG/24HR Place 1 patch onto the skin 2 (two) times a week. Sunday and Thursday    . FIBER SELECT GUMMIES PO Take 2 tablets by mouth at bedtime.    . Multiple  Vitamin (MULTIVITAMIN) tablet Take 1 tablet by mouth daily.      . Omega-3 Fatty Acids (FISH OIL) 1200 MG CAPS Take 1,200 mg by mouth daily.     . vitamin C (ASCORBIC ACID) 500 MG tablet Take 500 mg by mouth daily.       No current facility-administered medications on file prior to visit.    Past Medical History  Diagnosis Date  . GERD (gastroesophageal reflux disease)   . History of kidney stones   . History of colon polyps 2006    adenomatous, no polyps in 2011  . Urine incontinence   . Osteopenia     dexa 08/2010: -2.3 L fem  . History of hiatal hernia   . Migraines     decreased since menopause  . Arthritis   . PONV (postoperative nausea and vomiting)     only with Demerol    Past Surgical History  Procedure Laterality Date  . Breast surgery      breast biopsy x's 2 (L) 92 and (R) 98  . Abdominal hysterectomy  2006  . Septoplasty    . Laser endrometriosis    . Colonoscopy    . Colonoscopy with propofol N/A 02/11/2015  Procedure: COLONOSCOPY WITH PROPOFOL;  Surgeon: Clarene Essex, MD;  Location: [facility] ENDOSCOPY;  Service: Endoscopy;  Laterality: N/A;  . Esophagogastroduodenoscopy N/A 02/11/2015    Procedure: ESOPHAGOGASTRODUODENOSCOPY (EGD);  Surgeon: Clarene Essex, MD;  [facility] ENDOSCOPY;  Service: Endoscopy;  Laterality: N/A;    Social History   Social History  . Marital Status: Married    Spouse Name: N/A  . Number of Children: N/A  . Years of Education: N/A   Social History Main Topics  . Smoking status: Never Smoker   . Smokeless tobacco: Never Used  . Alcohol Use: No  . Drug Use: No  . Sexual Activity: Not on file   Other Topics Concern  . 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 Arthritis Mother   . Diverticulitis Mother   . Arthritis Father   . COPD Father   . Diabetes Other   . Heart disease Other   . Hyperlipidemia Other     . Hypertension Other   . Stroke Other   . Breast cancer Sister 68    kidney cancer at age 35  . Diverticulitis Sister 97    s/p colectomy  . Diverticulitis Brother   . Pancreatic cancer Paternal Uncle 47  . Stomach cancer Paternal Uncle 5  . Ovarian cancer Other 34    mat great aunt through [facility] with ovarian    Review of Systems  Constitutional: Negative for fever.  Respiratory: Negative for cough, shortness of breath and wheezing.   Cardiovascular: Positive for chest pain (esphageal spasms). Negative for palpitations and leg swelling.  Gastrointestinal:       Esophageal spasms  Musculoskeletal: Positive for back pain.  Neurological: Positive for headaches. Negative for light-headedness.       Objective:   Filed Vitals:   08/01/15 0911  BP: 112/68  Pulse: 72  Temp: 97.9 F (36.6 C)  Resp: 16   Filed Weights   08/01/15 0911  Weight: 112 lb (50.803 kg)   Body mass index is 19.38 kg/(m^2).   Physical Exam Constitutional: Appears well-developed and well-nourished. No distress.  Neck: Neck supple. No tracheal deviation present. No thyromegaly present.  No carotid bruit. No cervical adenopathy.   Cardiovascular: Normal rate, regular rhythm and normal heart sounds.   No murmur heard.  No edema Pulmonary/Chest: Effort normal and breath sounds normal. No respiratory distress. No wheezes.       Assessment & Plan:    Screening blood work at her convenience  See Problem List for Assessment and Plan of chronic medical problems.  Follow up annually

## 2015-08-01 NOTE — Assessment & Plan Note (Addendum)
DEXA done by gyn - last done 10/2012 history of osteoporosis - improved after fosamax x 5 years Taking calcium and vitamin D daily - vitamin D level good.  Not sure if she is taking calcium carbonate or citrate.  Advised to makes ure she i staking calcium citrate since on nexium Walking daily - continue Will call gyn to get dexa sheculed

## 2015-08-01 NOTE — Progress Notes (Signed)
Pre visit review using our clinic review tool, if applicable. No additional management support is needed unless otherwise documented below in the visit note. 

## 2015-08-01 NOTE — Patient Instructions (Addendum)
Make sure you are taking calcium citrate not calcium carbonate.     We have reviewed your prior records including labs and tests today.   Medications reviewed and updated.  No changes recommended at this time.

## 2015-08-01 NOTE — Assessment & Plan Note (Addendum)
Had EGD last year Taking nexium daily, still occasional GERD, but overall controlled Follow with GI Continue GERD diet Continue daily nexium

## 2015-08-03 ENCOUNTER — Other Ambulatory Visit: Payer: Self-pay | Admitting: Obstetrics and Gynecology

## 2015-08-05 ENCOUNTER — Encounter: Payer: Self-pay | Admitting: Internal Medicine

## 2015-08-05 ENCOUNTER — Other Ambulatory Visit: Payer: Self-pay | Admitting: Obstetrics and Gynecology

## 2015-08-05 ENCOUNTER — Other Ambulatory Visit (INDEPENDENT_AMBULATORY_CARE_PROVIDER_SITE_OTHER): Payer: 59

## 2015-08-05 DIAGNOSIS — Z0000 Encounter for general adult medical examination without abnormal findings: Secondary | ICD-10-CM | POA: Diagnosis not present

## 2015-08-05 DIAGNOSIS — M8580 Other specified disorders of bone density and structure, unspecified site: Secondary | ICD-10-CM

## 2015-08-05 LAB — CBC WITH DIFFERENTIAL/PLATELET
BASOS PCT: 0.7 % (ref 0.0–3.0)
Basophils Absolute: 0 10*3/uL (ref 0.0–0.1)
EOS PCT: 1.1 % (ref 0.0–5.0)
Eosinophils Absolute: 0.1 10*3/uL (ref 0.0–0.7)
HEMATOCRIT: 43.5 % (ref 36.0–46.0)
HEMOGLOBIN: 14.6 g/dL (ref 12.0–15.0)
LYMPHS PCT: 35.4 % (ref 12.0–46.0)
Lymphs Abs: 1.8 10*3/uL (ref 0.7–4.0)
MCHC: 33.6 g/dL (ref 30.0–36.0)
MCV: 89.3 fl (ref 78.0–100.0)
MONO ABS: 0.4 10*3/uL (ref 0.1–1.0)
MONOS PCT: 7.1 % (ref 3.0–12.0)
Neutro Abs: 2.8 10*3/uL (ref 1.4–7.7)
Neutrophils Relative %: 55.7 % (ref 43.0–77.0)
Platelets: 164 10*3/uL (ref 150.0–400.0)
RBC: 4.87 Mil/uL (ref 3.87–5.11)
RDW: 13.9 % (ref 11.5–15.5)
WBC: 5.1 10*3/uL (ref 4.0–10.5)

## 2015-08-05 LAB — LIPID PANEL
CHOLESTEROL: 181 mg/dL (ref 0–200)
HDL: 92.4 mg/dL (ref >39.00)
LDL Cholesterol: 78 mg/dL (ref 0–99)
NonHDL: 89.05
TRIGLYCERIDES: 55 mg/dL (ref 0.0–149.0)
Total CHOL/HDL Ratio: 2
VLDL: 11 mg/dL (ref 0.0–40.0)

## 2015-08-05 LAB — COMPREHENSIVE METABOLIC PANEL
ALBUMIN: 4.4 g/dL (ref 3.5–5.2)
ALK PHOS: 42 U/L (ref 39–117)
ALT: 21 U/L (ref 0–35)
AST: 21 U/L (ref 0–37)
BUN: 15 mg/dL (ref 6–23)
CALCIUM: 9.4 mg/dL (ref 8.4–10.5)
CHLORIDE: 105 meq/L (ref 96–112)
CO2: 29 mEq/L (ref 19–32)
Creatinine, Ser: 0.7 mg/dL (ref 0.40–1.20)
GFR: 91.75 mL/min (ref >60.00)
Glucose, Bld: 81 mg/dL (ref 70–99)
POTASSIUM: 3.8 meq/L (ref 3.5–5.1)
SODIUM: 141 meq/L (ref 135–145)
TOTAL PROTEIN: 6.5 g/dL (ref 6.0–8.3)
Total Bilirubin: 0.7 mg/dL (ref 0.2–1.2)

## 2015-08-05 LAB — TSH: TSH: 1.32 u[IU]/mL (ref 0.35–4.50)

## 2015-09-21 ENCOUNTER — Ambulatory Visit
Admission: RE | Admit: 2015-09-21 | Discharge: 2015-09-21 | Disposition: A | Discharge status: Home / Self Care | Payer: 59 | Source: Ambulatory Visit | Attending: Obstetrics and Gynecology | Admitting: Obstetrics and Gynecology

## 2015-09-21 DIAGNOSIS — M8589 Other specified disorders of bone density and structure, multiple sites: Secondary | ICD-10-CM | POA: Diagnosis not present

## 2015-09-21 DIAGNOSIS — Z780 Asymptomatic menopausal state: Secondary | ICD-10-CM | POA: Diagnosis not present

## 2015-09-21 DIAGNOSIS — M8580 Other specified disorders of bone density and structure, unspecified site: Secondary | ICD-10-CM

## 2015-09-28 ENCOUNTER — Telehealth: Payer: Self-pay | Admitting: Emergency Medicine

## 2015-09-28 ENCOUNTER — Encounter: Payer: Self-pay | Admitting: Emergency Medicine

## 2015-09-28 NOTE — Telephone Encounter (Signed)
Spoke with pt, informed of results, results also mailed.

## 2015-10-03 DIAGNOSIS — D2261 Melanocytic nevi of right upper limb, including shoulder: Secondary | ICD-10-CM | POA: Diagnosis not present

## 2015-10-03 DIAGNOSIS — D2262 Melanocytic nevi of left upper limb, including shoulder: Secondary | ICD-10-CM | POA: Diagnosis not present

## 2015-10-03 DIAGNOSIS — D225 Melanocytic nevi of trunk: Secondary | ICD-10-CM | POA: Diagnosis not present

## 2015-10-03 DIAGNOSIS — D2271 Melanocytic nevi of right lower limb, including hip: Secondary | ICD-10-CM | POA: Diagnosis not present

## 2015-10-03 DIAGNOSIS — D1801 Hemangioma of skin and subcutaneous tissue: Secondary | ICD-10-CM | POA: Diagnosis not present

## 2015-10-03 DIAGNOSIS — D2272 Melanocytic nevi of left lower limb, including hip: Secondary | ICD-10-CM | POA: Diagnosis not present

## 2015-10-03 MED FILL — ESTRADIOL 0.075 MG PATCH: 0.075 | 84 days supply | Qty: 24 | Fill #2

## 2015-10-10 MED FILL — ESOMEPRAZOLE MAG DR 40 MG C: 40 | 90 days supply | Qty: 90 | Fill #0

## 2015-12-26 DIAGNOSIS — Z681 Body mass index (BMI) 19 or less, adult: Secondary | ICD-10-CM | POA: Diagnosis not present

## 2015-12-26 DIAGNOSIS — Z01419 Encounter for gynecological examination (general) (routine) without abnormal findings: Secondary | ICD-10-CM | POA: Diagnosis not present

## 2015-12-26 MED FILL — ESTRADIOL 0.05 MG PATCH: 0.05 | 84 days supply | Qty: 24 | Fill #0

## 2015-12-26 MED FILL — AMPICILLIN TR 500 MG CAP: 500 | 90 days supply | Qty: 90 | Fill #0

## 2016-01-02 DIAGNOSIS — N644 Mastodynia: Secondary | ICD-10-CM | POA: Diagnosis not present

## 2016-01-30 MED FILL — ESOMEPRAZOLE MAG DR 40 MG C: 40 | 90 days supply | Qty: 90 | Fill #1

## 2016-03-15 ENCOUNTER — Encounter: Payer: Self-pay | Admitting: Internal Medicine

## 2016-03-26 MED FILL — ESTRADIOL 0.05 MG PATCH: 0.05 | 84 days supply | Qty: 24 | Fill #1

## 2016-03-26 MED FILL — AMPICILLIN TR 500 MG CAP: 500 | 30 days supply | Qty: 30 | Fill #1

## 2016-04-02 ENCOUNTER — Other Ambulatory Visit: Payer: Self-pay | Admitting: Obstetrics and Gynecology

## 2016-04-02 DIAGNOSIS — Z1231 Encounter for screening mammogram for malignant neoplasm of breast: Secondary | ICD-10-CM

## 2016-04-09 ENCOUNTER — Other Ambulatory Visit (HOSPITAL_COMMUNITY): Payer: Self-pay | Admitting: Obstetrics and Gynecology

## 2016-04-09 DIAGNOSIS — Z803 Family history of malignant neoplasm of breast: Secondary | ICD-10-CM

## 2016-04-23 MED FILL — ESOMEPRAZOLE MAG DR 40 MG C: 40 | 90 days supply | Qty: 90 | Fill #2

## 2016-04-23 MED FILL — AMPICILLIN TR 500 MG CAP: 500 | 30 days supply | Qty: 30 | Fill #2

## 2016-05-02 ENCOUNTER — Ambulatory Visit: Payer: Self-pay

## 2016-05-02 ENCOUNTER — Ambulatory Visit (INDEPENDENT_AMBULATORY_CARE_PROVIDER_SITE_OTHER): Payer: 59 | Admitting: Student

## 2016-05-02 VITALS — BP 106/38 | Ht 64.0 in | Wt 114.0 lb

## 2016-05-02 DIAGNOSIS — M79642 Pain in left hand: Secondary | ICD-10-CM | POA: Diagnosis not present

## 2016-05-02 NOTE — Assessment & Plan Note (Signed)
Likely bone bruise of phalange surrounding DIP joint.  Would recommend follow up if not improved in 2-3 weeks.  Gave finger splint to use at work or when active.

## 2016-05-02 NOTE — Progress Notes (Signed)
  Katherine Vaughan - 55 y.o. female MRN [id number]  Date of birth: 10-28-1960  SUBJECTIVE:  Including CC & ROS.  CC: left finger pain Presents with 1 month history of left 2nd-4th digit finger pain that began after slamming her hand in her car door.  She reports bruising to the area at the time.  Her 3rd and 4th digits are doing well and almost have no pain.  She still has pain on the dorsal aspect of her 2nd DIP.  No longer has ecchymosis.  She works as a Marine scientist in the hospital and has pain with using her hands to do things such as taking BP.  She has not splinted her finger at all.   ROS: No unexpected weight loss, fever, chills, swelling, instability, muscle pain, numbness/tingling, redness, otherwise see HPI   PMHx - Updated and reviewed.  Contributory factors include: Vitamin D deficiency PSHx - Updated and reviewed.  Contributory factors include:  Negative FHx - Updated and reviewed.  Contributory factors include:  Negative Social Hx - Updated and reviewed. Contributory factors include: Negative Medications - reviewed   DATA REVIEWED: none  PHYSICAL EXAM:  VS: BP:(!) 106/38  HR: bpm  TEMP: ( )  RESP:   HT:5\' 4"  (162.6 cm)   WT:114 lb (51.7 kg)  BMI:19.6 PHYSICAL EXAM: Gen: NAD, alert, cooperative with exam, well-appearing HEENT: clear conjunctiva,  CV:  no edema, capillary refill brisk, normal rate Resp: non-labored Skin: no rashes, normal turgor  Neuro: no gross deficits.  Psych:  alert and oriented . Hand: No erythema or ecchymosis at inspection of left hand TTP at DIP of 2nd digit of left hand, no pain on other digits with palpation Full ROM of all digits of bilateral hands Full strength of left hand in grip strength Neurovascularly intact distally  Ultrasound: 2nd digit of left hand viewed at DIP in long and short axis without any fracture or swelling superficial to bone.  No effusion at DIP joint.  Tendon intact with no avulsion of bone.  Doppler flow did not show any  areas with increased vascularity.  No pathologic findings seen on ultrasound.  ASSESSMENT & PLAN:   Left hand pain Likely bone bruise of phalange surrounding DIP joint.  Would recommend follow up if not improved in 2-3 weeks.  Gave finger splint to use at work or when active.

## 2016-05-15 ENCOUNTER — Ambulatory Visit
Admission: RE | Admit: 2016-05-15 | Discharge: 2016-05-15 | Disposition: A | Discharge status: Home / Self Care | Payer: 59 | Source: Ambulatory Visit | Attending: Obstetrics and Gynecology | Admitting: Obstetrics and Gynecology

## 2016-05-15 DIAGNOSIS — Z1231 Encounter for screening mammogram for malignant neoplasm of breast: Secondary | ICD-10-CM

## 2016-05-17 ENCOUNTER — Ambulatory Visit (HOSPITAL_COMMUNITY): Admission: RE | Admit: 2016-05-17 | Payer: 59 | Source: Ambulatory Visit

## 2016-05-21 ENCOUNTER — Ambulatory Visit (HOSPITAL_COMMUNITY)
Admission: RE | Admit: 2016-05-21 | Discharge: 2016-05-21 | Disposition: A | Discharge status: Home / Self Care | Payer: 59 | Source: Ambulatory Visit | Attending: Obstetrics and Gynecology | Admitting: Obstetrics and Gynecology

## 2016-05-21 ENCOUNTER — Ambulatory Visit (HOSPITAL_COMMUNITY): Admission: RE | Admit: 2016-05-21 | Payer: 59 | Source: Ambulatory Visit

## 2016-05-21 DIAGNOSIS — Z803 Family history of malignant neoplasm of breast: Secondary | ICD-10-CM | POA: Insufficient documentation

## 2016-05-21 MED ORDER — GADOBENATE DIMEGLUMINE 529 MG/ML IV SOLN
10.0000 mL | Freq: Once | INTRAVENOUS | Status: AC | PRN
  Administered 2016-05-21: 10 mL via INTRAVENOUS

## 2016-05-23 ENCOUNTER — Ambulatory Visit: Payer: 59

## 2016-05-29 ENCOUNTER — Encounter: Payer: Self-pay | Admitting: Internal Medicine

## 2016-06-18 MED FILL — ESTRADIOL 0.05 MG PATCH: 0.05 | 84 days supply | Qty: 24 | Fill #2

## 2016-07-16 MED FILL — AMPICILLIN TR 500 MG CAP: 500 | 30 days supply | Qty: 30 | Fill #3

## 2016-07-20 ENCOUNTER — Ambulatory Visit (INDEPENDENT_AMBULATORY_CARE_PROVIDER_SITE_OTHER): Payer: 59 | Admitting: Internal Medicine

## 2016-07-20 ENCOUNTER — Encounter: Payer: Self-pay | Admitting: Internal Medicine

## 2016-07-20 VITALS — BP 104/66 | HR 84 | Ht 63.25 in | Wt 112.0 lb

## 2016-07-20 DIAGNOSIS — Z83719 Family history of colon polyps, unspecified: Secondary | ICD-10-CM

## 2016-07-20 DIAGNOSIS — Z8371 Family history of colonic polyps: Secondary | ICD-10-CM | POA: Diagnosis not present

## 2016-07-20 DIAGNOSIS — K219 Gastro-esophageal reflux disease without esophagitis: Secondary | ICD-10-CM

## 2016-07-20 MED ORDER — ESOMEPRAZOLE MAGNESIUM 40 MG PO CPDR: 40.0000 mg | DELAYED_RELEASE_CAPSULE | Freq: Every day | ORAL | 11 refills | Status: DC

## 2016-07-20 MED FILL — ESOMEPRAZOLE MAG DR 40 MG C: 40 | 90 days supply | Qty: 90 | Fill #0

## 2016-07-20 NOTE — Progress Notes (Signed)
HISTORY OF PRESENT ILLNESS:  Katherine Vaughan is a 56 y.o. 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, who is self-referred to establish GI care. Previous patient of Dr. Clarene Vaughan. She has history of chronic reflux disease for which she has been on PPI for many years. Off medication significant symptoms. No dysphagia except occasional problem with pills in the region of the cricopharyngeus. She did have upper endoscopy September 2016. She was found to have benign fundic gland polyps and a small hiatal hernia. Examination was otherwise unremarkable. No Barrett's. Colonoscopy at that time was said to be normal. She reports a significant family history of colon polyps in her parents and multiple siblings at a young age. GI review of systems currently is negative.  REVIEW OF SYSTEMS:  All non-GI ROS negative except for(  Past Medical History:  Diagnosis Date  . Arthritis   . GERD (gastroesophageal reflux disease)   . History of colon polyps 2006   adenomatous, no polyps in 2011  . History of hiatal hernia   . History of kidney stones   . Migraines    decreased since menopause  . Osteopenia    dexa 08/2010: -2.3 L fem  . PONV (postoperative nausea and vomiting)    only with Demerol  . Urine incontinence     Past Surgical History:  Procedure Laterality Date  . ABDOMINAL HYSTERECTOMY  2006  . BREAST SURGERY     breast biopsy x's 2 (L) 92 and (R) 98  . COLONOSCOPY    . COLONOSCOPY WITH PROPOFOL N/A 02/11/2015   Procedure: COLONOSCOPY WITH PROPOFOL;  Surgeon: Katherine Essex, MD;  Location: [facility] ENDOSCOPY;  Service: Endoscopy;  Laterality: N/A;  . ESOPHAGOGASTRODUODENOSCOPY N/A 02/11/2015   Procedure: ESOPHAGOGASTRODUODENOSCOPY (EGD);  Surgeon: Katherine Essex, MD;  Location: [facility] ENDOSCOPY;  Service: Endoscopy;  Laterality: N/A;  . laser endrometriosis    . SEPTOPLASTY      Social History Katherine Vaughan  reports that she has never smoked. She has never  used smokeless tobacco. She reports that she does not drink alcohol or use drugs.  family history includes Arthritis in her father and mother; Breast cancer (age of onset: 41) in her sister; COPD in her father; Diabetes in her other; Diverticulitis in her brother and mother; Diverticulitis (age of onset: 60) in her sister; Heart disease in her other; Hyperlipidemia in her other; Hypertension in her other; Ovarian cancer (age of onset: 18) in her other; Pancreatic cancer (age of onset: 70) in her paternal uncle; Stomach cancer (age of onset: 39) in her paternal uncle; Stroke in her other.  Allergies  Allergen Reactions  . Demerol   . Percocet [Oxycodone-Acetaminophen]   . Sulfa Antibiotics        PHYSICAL EXAMINATION: Vital signs: BP 104/66   Pulse 84   Ht 5' 3.25" (1.607 m)   Wt 112 lb (50.8 kg)   BMI 19.68 kg/m   Constitutional: generally well-appearing, no acute distress Psychiatric: alert and oriented x3, cooperative Eyes: extraocular movements intact, anicteric, conjunctiva pink Mouth: oral pharynx moist, no lesions Neck: supple no lymphadenopathy Cardiovascular: heart regular rate and rhythm, no murmur Lungs: clear to auscultation bilaterally Abdomen: soft, nontender, nondistended, no obvious ascites, no peritoneal signs, normal bowel sounds, no organomegaly Rectal: Omitted Extremities: no clubbing cyanosis or lower extremity edema bilaterally Skin: no lesions on visible extremities Neuro: No focal deficits. No asterixis.    ASSESSMENT:  #1. Chronic GERD. Requires PPI to control symptoms #2.  EGD September 2016 with benign fundic gland polyps. Otherwise negative #3. Family history of adenomatous colon polyps (per her report) in multiple first-degree relatives. Colonoscopy September 2016 was unremarkable   PLAN:  #1. Reflux precautions #2. Continue PPI. Lowest dose to control symptoms #3. We had a discussion today regarding the current knowledge base on potential  long-term side effects of PPIs. #4. Routine GI office follow-up one year. Sooner if needed #5. Surveillance colonoscopy around September 2016 given family history. This is consistent with the previous recommendation that she was provided

## 2016-07-20 NOTE — Patient Instructions (Signed)
We have sent the following medications to your pharmacy for you to pick up at your convenience:  Nexium  Please follow up in one year  

## 2016-08-12 NOTE — Progress Notes (Signed)
Subjective:    Patient ID: Katherine Vaughan, female    DOB: May 18, 1961, 56 y.o.   MRN: [id number]  HPI She is here for a physical exam.   She does walk for exercise.  She is walking most days.   She has had increased stress over the past few years trying to help with her father-in-law's health.  He is very ill and has been near death.  She has hired [employer] for him, takes him to his appt's and helps manage his health.  This has been very time consuming and she is very stressed.     Medications and allergies reviewed with patient and updated if appropriate.  Patient Active Problem List   Diagnosis Date Noted  . Left hand pain 05/02/2016  . Acne 08/01/2015  . Family history of malignant neoplasm of breast 01/04/2014  . Family history of malignant neoplasm of gastrointestinal tract 01/04/2014  . Family history of malignant neoplasm of ovary 01/04/2014  . Vitamin D deficiency disease 07/04/2011  . Osteopenia   . GERD (gastroesophageal reflux disease)     Current Outpatient Prescriptions on File Prior to Visit  Medication Sig Dispense Refill  . ampicillin (PRINCIPEN) 500 MG capsule Take 500 mg by mouth every other day.      . Ascorbic Acid (VITAMIN C) 1000 MG tablet Take 1,000 mg by mouth daily.    . Calcium Carbonate-Vitamin D (CALCIUM + D PO) Take 500 mg by mouth 2 (two) times daily.     . Cholecalciferol (VITAMIN D3) 2000 UNITS TABS Take by mouth daily.      Marland Kitchen esomeprazole (NEXIUM) 40 MG capsule Take 1 capsule (40 mg total) by mouth daily. 30 capsule 11  . estradiol (CLIMARA - DOSED IN MG/24 HR) 0.05 mg/24hr patch Place 0.05 mg onto the skin as directed. Two times weekly    . FIBER SELECT GUMMIES PO Take 2 tablets by mouth at bedtime.    . Multiple Vitamin (MULTIVITAMIN) tablet Take 1 tablet by mouth daily.      . Omega-3 Fatty Acids (FISH OIL) 1200 MG CAPS Take 1,200 mg by mouth daily.      No current facility-administered medications on file prior to visit.     Past Medical  History:  Diagnosis Date  . Arthritis   . GERD (gastroesophageal reflux disease)   . History of colon polyps 2006   adenomatous, no polyps in 2011  . History of hiatal hernia   . History of kidney stones   . Migraines    decreased since menopause  . Osteopenia    dexa 08/2010: -2.3 L fem  . PONV (postoperative nausea and vomiting)    only with Demerol  . Urine incontinence     Past Surgical History:  Procedure Laterality Date  . ABDOMINAL HYSTERECTOMY  2006  . BREAST SURGERY     breast biopsy x's 2 (L) 92 and (R) 98  . COLONOSCOPY    . COLONOSCOPY WITH PROPOFOL N/A 02/11/2015   Procedure: COLONOSCOPY WITH PROPOFOL;  Surgeon: Clarene Essex, MD;  [facility]: [facility] ENDOSCOPY;  Service: Endoscopy;  Laterality: N/A;  . ESOPHAGOGASTRODUODENOSCOPY N/A 02/11/2015   Procedure: ESOPHAGOGASTRODUODENOSCOPY (EGD);  Surgeon: Clarene Essex, MD;  Location: [facility] ENDOSCOPY;  Service: Endoscopy;  Laterality: N/A;  . laser endrometriosis    . SEPTOPLASTY      Social History   Social History  . Marital status: Married    Spouse name: N/A  . Number of children: 0  .  Years of education: N/A   Occupational History  . nurse    Social History Main Topics  . Smoking status: Never Smoker  . Smokeless tobacco: Never Used  . Alcohol use No  . Drug use: No  . Sexual activity: Not on file   Other Topics Concern  . 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 Arthritis Mother   . Diverticulitis Mother   . Arthritis Father   . COPD Father   . Diabetes Other   . Heart disease Other   . Hyperlipidemia Other   . Hypertension Other   . Stroke Other   . Breast cancer Sister 34    kidney cancer at age 27  . Diverticulitis Sister 21    s/p colectomy  . Diverticulitis Brother   . Pancreatic cancer Paternal Uncle 37  . Stomach cancer Paternal Uncle 62  . Ovarian cancer Other 34    mat  great aunt through [facility] with ovarian    Review of Systems  Constitutional: Negative for chills and fever.  Eyes: Negative for visual disturbance.  Respiratory: Negative for cough, shortness of breath and wheezing.   Cardiovascular: Negative for chest pain, palpitations and leg swelling.  Gastrointestinal: Negative for abdominal pain, blood in stool, constipation and diarrhea.       Gerd controlled  Genitourinary: Negative for dysuria and hematuria.  Musculoskeletal: Positive for arthralgias (mild arthritis).  Skin: Negative for color change and rash.  Neurological: Positive for headaches. Negative for light-headedness.  Psychiatric/Behavioral: Negative for dysphoric mood. The patient is nervous/anxious.        Objective:   Vitals:   08/13/16 1025  BP: 92/62  Pulse: 69  Resp: 16  Temp: 97.8 F (36.6 C)   Filed Weights   08/13/16 1025  Weight: 113 lb (51.3 kg)   Body mass index is 20.02 kg/m.  Wt Readings from Last 3 Encounters:  08/13/16 113 lb (51.3 kg)  07/20/16 112 lb (50.8 kg)  05/02/16 114 lb (51.7 kg)     Physical Exam Constitutional: She appears well-developed and well-nourished. No distress.  HENT:  Head: Normocephalic and atraumatic.  Right Ear: External ear normal. Normal ear canal and TM Left Ear: External ear normal.  Normal ear canal and TM Mouth/Throat: Oropharynx is clear and moist.  Eyes: Conjunctivae and EOM are normal.  Neck: Neck supple. No tracheal deviation present. No thyromegaly present.  No carotid bruit  Cardiovascular: Normal rate, regular rhythm and normal heart sounds.   No murmur heard.  No edema. Pulmonary/Chest: Effort normal and breath sounds normal. No respiratory distress. She has no wheezes. She has no rales.  Breast: deferred to Gyn Abdominal: Soft. She exhibits no distension. There is no tenderness.  Lymphadenopathy: She has no cervical adenopathy.  Skin: Skin is warm and dry. She is not diaphoretic.  Psychiatric: She has a  normal mood and affect. Her behavior is normal.         Assessment & Plan:   Physical exam: Screening blood work  ordered Immunizations  Up to date  Colonoscopy  Up to date  Mammogram  Up to date  Gyn  Up to date  Dexa  Up to date  Eye exams  Up to date  EKG  - done 2014, no need to repeat today Exercise - walking regularly Weight -  normal BMI Skin   No concerns - sees Dr Ronnald Ramp annually Substance abuse  none  See Problem List for Assessment and Plan of chronic medical problems.

## 2016-08-13 ENCOUNTER — Encounter: Payer: Self-pay | Admitting: Internal Medicine

## 2016-08-13 ENCOUNTER — Ambulatory Visit (INDEPENDENT_AMBULATORY_CARE_PROVIDER_SITE_OTHER): Payer: 59 | Admitting: Internal Medicine

## 2016-08-13 ENCOUNTER — Other Ambulatory Visit (INDEPENDENT_AMBULATORY_CARE_PROVIDER_SITE_OTHER): Payer: 59

## 2016-08-13 VITALS — BP 92/62 | HR 69 | Temp 97.8°F | Resp 16 | Ht 63.0 in | Wt 113.0 lb

## 2016-08-13 DIAGNOSIS — Z0000 Encounter for general adult medical examination without abnormal findings: Secondary | ICD-10-CM

## 2016-08-13 DIAGNOSIS — Z1159 Encounter for screening for other viral diseases: Secondary | ICD-10-CM | POA: Diagnosis not present

## 2016-08-13 DIAGNOSIS — F419 Anxiety disorder, unspecified: Secondary | ICD-10-CM | POA: Diagnosis not present

## 2016-08-13 DIAGNOSIS — K219 Gastro-esophageal reflux disease without esophagitis: Secondary | ICD-10-CM

## 2016-08-13 LAB — COMPREHENSIVE METABOLIC PANEL
ALBUMIN: 4.2 g/dL (ref 3.5–5.2)
ALT: 20 U/L (ref 0–35)
AST: 18 U/L (ref 0–37)
Alkaline Phosphatase: 39 U/L (ref 39–117)
BUN: 13 mg/dL (ref 6–23)
CALCIUM: 9.4 mg/dL (ref 8.4–10.5)
CHLORIDE: 103 meq/L (ref 96–112)
CO2: 29 mEq/L (ref 19–32)
Creatinine, Ser: 0.67 mg/dL (ref 0.40–1.20)
GFR: 96.16 mL/min (ref >60.00)
Glucose, Bld: 81 mg/dL (ref 70–99)
POTASSIUM: 3.7 meq/L (ref 3.5–5.1)
Sodium: 139 mEq/L (ref 135–145)
Total Bilirubin: 1.2 mg/dL (ref 0.2–1.2)
Total Protein: 6.5 g/dL (ref 6.0–8.3)

## 2016-08-13 LAB — CBC WITH DIFFERENTIAL/PLATELET
BASOS PCT: 0.8 % (ref 0.0–3.0)
Basophils Absolute: 0.1 10*3/uL (ref 0.0–0.1)
Eosinophils Absolute: 0.1 10*3/uL (ref 0.0–0.7)
Eosinophils Relative: 0.8 % (ref 0.0–5.0)
HCT: 42.6 % (ref 36.0–46.0)
HEMOGLOBIN: 14.4 g/dL (ref 12.0–15.0)
LYMPHS PCT: 22.7 % (ref 12.0–46.0)
Lymphs Abs: 1.5 10*3/uL (ref 0.7–4.0)
MCHC: 33.8 g/dL (ref 30.0–36.0)
MCV: 89.6 fl (ref 78.0–100.0)
Monocytes Absolute: 0.4 10*3/uL (ref 0.1–1.0)
Monocytes Relative: 6.2 % (ref 3.0–12.0)
Neutro Abs: 4.5 10*3/uL (ref 1.4–7.7)
Neutrophils Relative %: 69.5 % (ref 43.0–77.0)
Platelets: 134 10*3/uL — ABNORMAL LOW (ref 150.0–400.0)
RBC: 4.76 Mil/uL (ref 3.87–5.11)
RDW: 12.9 % (ref 11.5–15.5)
WBC: 6.5 10*3/uL (ref 4.0–10.5)

## 2016-08-13 LAB — LIPID PANEL
CHOLESTEROL: 172 mg/dL (ref 0–200)
HDL: 83.8 mg/dL (ref >39.00)
LDL CALC: 77 mg/dL (ref 0–99)
NonHDL: 88.35
TRIGLYCERIDES: 56 mg/dL (ref 0.0–149.0)
Total CHOL/HDL Ratio: 2
VLDL: 11.2 mg/dL (ref 0.0–40.0)

## 2016-08-13 LAB — HEPATITIS C ANTIBODY: HCV Ab: NEGATIVE

## 2016-08-13 NOTE — Patient Instructions (Addendum)
  Test(s) ordered today. Your results will be released to [place] (or called to you) after review, usually within 72hours after test completion. If any changes need to be made, you will be notified at that same time.  All other Health Maintenance issues reviewed.   All recommended immunizations and age-appropriate screenings are up-to-date or discussed.  No immunizations administered today.   Medications reviewed and updated.  No changes recommended at this time.   Please followup in one year for a physical

## 2016-08-13 NOTE — Assessment & Plan Note (Signed)
GERD controlled Continue daily medication  

## 2016-08-13 NOTE — Assessment & Plan Note (Signed)
Increased anxiety/stress due to dealing with her father-in-law's health and managing everything regarding this health She deferred medication Discussed stress management  Follow up if needed

## 2016-08-13 NOTE — Progress Notes (Signed)
Pre visit review using our clinic review tool, if applicable. No additional management support is needed unless otherwise documented below in the visit note. 

## 2016-08-14 ENCOUNTER — Encounter: Payer: Self-pay | Admitting: Internal Medicine

## 2016-08-14 LAB — TSH: TSH: 1 u[IU]/mL (ref 0.35–4.50)

## 2016-09-11 MED FILL — ESTRADIOL 0.05 MG PATCH: 0.05 | 84 days supply | Qty: 24 | Fill #3

## 2016-09-11 MED FILL — AMPICILLIN TR 500 MG CAP: 500 | 30 days supply | Qty: 30 | Fill #4

## 2016-10-08 DIAGNOSIS — D1801 Hemangioma of skin and subcutaneous tissue: Secondary | ICD-10-CM | POA: Diagnosis not present

## 2016-10-08 DIAGNOSIS — D2272 Melanocytic nevi of left lower limb, including hip: Secondary | ICD-10-CM | POA: Diagnosis not present

## 2016-10-08 DIAGNOSIS — D485 Neoplasm of uncertain behavior of skin: Secondary | ICD-10-CM | POA: Diagnosis not present

## 2016-10-08 DIAGNOSIS — D2261 Melanocytic nevi of right upper limb, including shoulder: Secondary | ICD-10-CM | POA: Diagnosis not present

## 2016-10-08 DIAGNOSIS — D2262 Melanocytic nevi of left upper limb, including shoulder: Secondary | ICD-10-CM | POA: Diagnosis not present

## 2016-10-08 DIAGNOSIS — D225 Melanocytic nevi of trunk: Secondary | ICD-10-CM | POA: Diagnosis not present

## 2016-10-08 DIAGNOSIS — D2271 Melanocytic nevi of right lower limb, including hip: Secondary | ICD-10-CM | POA: Diagnosis not present

## 2016-10-22 MED FILL — ESOMEPRAZOLE MAG DR 40 MG C: 40 | 90 days supply | Qty: 90 | Fill #1

## 2016-11-26 MED FILL — ESTRADIOL 0.05 MG PATCH: 0.05 | 84 days supply | Qty: 24 | Fill #4

## 2016-12-10 MED FILL — AMPICILLIN TR 500 MG CAP: 500 | 30 days supply | Qty: 30 | Fill #5

## 2016-12-18 MED FILL — AMOXICILLIN 500 MG CAPSULE: 500 | 7 days supply | Qty: 21 | Fill #0

## 2016-12-31 DIAGNOSIS — Z681 Body mass index (BMI) 19 or less, adult: Secondary | ICD-10-CM | POA: Diagnosis not present

## 2016-12-31 DIAGNOSIS — Z01419 Encounter for gynecological examination (general) (routine) without abnormal findings: Secondary | ICD-10-CM | POA: Diagnosis not present

## 2017-01-21 MED FILL — ESOMEPRAZOLE MAG DR 40 MG C: 40 | 90 days supply | Qty: 90 | Fill #2

## 2017-01-28 DIAGNOSIS — Z760 Encounter for issue of repeat prescription: Secondary | ICD-10-CM | POA: Diagnosis not present

## 2017-01-28 DIAGNOSIS — S90862A Insect bite (nonvenomous), left foot, initial encounter: Secondary | ICD-10-CM | POA: Diagnosis not present

## 2017-02-22 MED FILL — ESTRADIOL 0.05 MG PATCH: 0.05 | 84 days supply | Qty: 24 | Fill #0

## 2017-02-25 ENCOUNTER — Ambulatory Visit (INDEPENDENT_AMBULATORY_CARE_PROVIDER_SITE_OTHER): Payer: 59 | Admitting: Internal Medicine

## 2017-02-25 ENCOUNTER — Other Ambulatory Visit: Payer: 59

## 2017-02-25 ENCOUNTER — Encounter: Payer: Self-pay | Admitting: Internal Medicine

## 2017-02-25 VITALS — BP 108/70 | HR 71 | Temp 97.9°F | Ht 63.0 in | Wt 113.0 lb

## 2017-02-25 DIAGNOSIS — R21 Rash and other nonspecific skin eruption: Secondary | ICD-10-CM

## 2017-02-25 DIAGNOSIS — W57XXXA Bitten or stung by nonvenomous insect and other nonvenomous arthropods, initial encounter: Secondary | ICD-10-CM | POA: Diagnosis not present

## 2017-02-25 DIAGNOSIS — Z23 Encounter for immunization: Secondary | ICD-10-CM | POA: Diagnosis not present

## 2017-02-25 DIAGNOSIS — S90862A Insect bite (nonvenomous), left foot, initial encounter: Secondary | ICD-10-CM | POA: Diagnosis not present

## 2017-02-25 NOTE — Progress Notes (Signed)
Subjective:    Patient ID: Katherine Vaughan, female    DOB: August 14, 1960, 56 y.o.   MRN: [id number]  HPI She is here for an acute visit.   Tick bite with rash:  She had a tick bite three weeks ago.   The next night she saw the tick that was probably there for over one day.  She pulled the tick off.  She developed a rash the end of last week - today is the 5th day.  It is at the site where the tick was.  It does not itch or hurt.  It is improving.   Two weeks ago she had a bad cold. It passed after few days.    Medications and allergies reviewed with patient and updated if appropriate.  Patient Active Problem List   Diagnosis Date Noted  . Anxiety 08/13/2016  . Left hand pain 05/02/2016  . Acne 08/01/2015  . Family history of malignant neoplasm of breast 01/04/2014  . Family history of malignant neoplasm of gastrointestinal tract 01/04/2014  . Family history of malignant neoplasm of ovary 01/04/2014  . Vitamin D deficiency disease 07/04/2011  . Osteopenia   . GERD (gastroesophageal reflux disease)     Current Outpatient Prescriptions on File Prior to Visit  Medication Sig Dispense Refill  . ampicillin (PRINCIPEN) 500 MG capsule Take 500 mg by mouth every other day.      . Ascorbic Acid (VITAMIN C) 1000 MG tablet Take 1,000 mg by mouth daily.    . Calcium Carbonate-Vitamin D (CALCIUM + D PO) Take 500 mg by mouth 2 (two) times daily.     . Cholecalciferol (VITAMIN D3) 2000 UNITS TABS Take by mouth daily.      Marland Kitchen esomeprazole (NEXIUM) 40 MG capsule Take 1 capsule (40 mg total) by mouth daily. 30 capsule 11  . estradiol (CLIMARA - DOSED IN MG/24 HR) 0.05 mg/24hr patch Place 0.05 mg onto the skin as directed. Two times weekly    . FIBER SELECT GUMMIES PO Take 2 tablets by mouth at bedtime.    . Multiple Vitamin (MULTIVITAMIN) tablet Take 1 tablet by mouth daily.      . Omega-3 Fatty Acids (FISH OIL) 1200 MG CAPS Take 1,200 mg by mouth daily.      No current facility-administered  medications on file prior to visit.     Past Medical History:  Diagnosis Date  . Arthritis   . GERD (gastroesophageal reflux disease)   . History of colon polyps 2006   adenomatous, no polyps in 2011  . History of hiatal hernia   . History of kidney stones   . Migraines    decreased since menopause  . Osteopenia    dexa 08/2010: -2.3 L fem  . PONV (postoperative nausea and vomiting)    only with Demerol  . Urine incontinence     Past Surgical History:  Procedure Laterality Date  . ABDOMINAL HYSTERECTOMY  2006  . BREAST SURGERY     breast biopsy x's 2 (L) 92 and (R) 98  . COLONOSCOPY    . COLONOSCOPY WITH PROPOFOL N/A 02/11/2015   Procedure: COLONOSCOPY WITH PROPOFOL;  Surgeon: Clarene Essex, MD;  Location: [facility] ENDOSCOPY;  Service: Endoscopy;  Laterality: N/A;  . ESOPHAGOGASTRODUODENOSCOPY N/A 02/11/2015   Procedure: ESOPHAGOGASTRODUODENOSCOPY (EGD);  Surgeon: Clarene Essex, MD;  Location: [facility] ENDOSCOPY;  Service: Endoscopy;  Laterality: N/A;  . laser endrometriosis    . SEPTOPLASTY      Social History   Social  History  . Marital status: Married    Spouse name: N/A  . Number of children: 0  . Years of education: N/A   Occupational History  . nurse    Social History Main Topics  . Smoking status: Never Smoker  . Smokeless tobacco: Never Used  . Alcohol use No  . Drug use: No  . Sexual activity: Not Asked   Other Topics Concern  . 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 Arthritis Mother   . Diverticulitis Mother   . Arthritis Father   . COPD Father   . Diabetes Other   . Heart disease Other   . Hyperlipidemia Other   . Hypertension Other   . Stroke Other   . Breast cancer Sister 25       kidney cancer at age 10  . Diverticulitis Sister 79       s/p colectomy  . Diverticulitis Brother   . Pancreatic cancer Paternal Uncle 63  . Stomach cancer  Paternal Uncle 81  . Ovarian cancer Other 21       mat great aunt through [facility] with ovarian    Review of Systems  Constitutional: Negative for chills and fever.  Musculoskeletal: Negative for arthralgias (no change in chronic arthritis) and myalgias.  Skin: Positive for rash.  Neurological: Negative for light-headedness and headaches.       Objective:   Vitals:   02/25/17 1323  BP: 108/70  Pulse: 71  Temp: 97.9 F (36.6 C)  SpO2: 97%   Filed Weights   02/25/17 1323  Weight: 113 lb (51.3 kg)   Body mass index is 20.02 kg/m.  Wt Readings from Last 3 Encounters:  02/25/17 113 lb (51.3 kg)  08/13/16 113 lb (51.3 kg)  07/20/16 112 lb (50.8 kg)     Physical Exam  Constitutional: She appears well-developed and well-nourished. No distress.  HENT:  Head: Normocephalic and atraumatic.  Skin: Skin is warm and dry. Rash (left dorsal proximal foot - mauclar papular rash) noted. She is not diaphoretic.          Assessment & Plan:  tdap today - she is due  See Problem List for Assessment and Plan of chronic medical problems.

## 2017-02-25 NOTE — Patient Instructions (Addendum)
Test(s) ordered today. Your results will be released to [place] (or called to you) after review, usually within 72hours after test completion. If any changes need to be made, you will be notified at that same time.   Medications reviewed and updated.  No changes recommended at this time.     Tick Bite Information, Adult Ticks are insects that draw blood for food. Most ticks live in shrubs and grassy areas. They climb onto people and animals that brush against the leaves and grasses that they rest on. Then they bite, attaching themselves to the skin. Most ticks are harmless, but some ticks carry germs that can spread to a person through a bite and cause a disease. To reduce your risk of getting a disease from a tick bite, it is important to take steps to prevent tick bites. It is also important to check for ticks after being outdoors. If you find that a tick has attached to you, watch for symptoms of disease. How can I prevent tick bites? Take these steps to help prevent tick bites when you are outdoors in an area where ticks are found:  Use insect repellent that has DEET (20% or higher), picaridin, or IR3535 in it. Use it on: ? Skin that is showing. ? The top of your boots. ? Your pant legs. ? Your sleeve cuffs.  For repellent products that contain permethrin, follow product instructions. Use these products on: ? Clothing. ? Gear. ? Boots. ? Tents.  Wear protective clothing. Long sleeves and long pants offer the best protection from ticks.  Wear light-colored clothing so you can see ticks more easily.  Tuck your pant legs into your socks.  If you go walking on a trail, stay in the middle of the trail so your skin, hair, and clothing do not touch the bushes.  Avoid walking through areas with long grass.  Check for ticks on your clothing, hair, and skin often while you are outside, and check again before you go inside. Make sure to check the places that ticks attach themselves  most often. These places include the scalp, neck, armpits, waist, groin, and joint areas. Ticks that carry a disease called Lyme disease have to be attached to the skin for 24-48 hours. Checking for ticks every day will lessen your risk of this and other diseases.  When you come indoors, wash your clothes and take a shower or a bath right away. Dry your clothes in a dryer on high heat for at least 60 minutes. This will kill any ticks in your clothes.  What is the proper way to remove a tick? If you find a tick on your body, remove it as soon as possible. Removing a tick sooner rather than later can prevent germs from passing from the tick to your body. To remove a tick that is crawling on your skin but has not bitten:  Go outdoors and brush the tick off.  Remove the tick with tape or a lint roller.  To remove a tick that is attached to your skin:  Wash your hands.  If you have latex gloves, put them on.  Use tweezers, curved forceps, or a tick-removal tool to gently grasp the tick as close to your skin and the tick's head as possible.  Gently pull with steady, upward pressure until the tick lets go. When removing the tick: ? Take care to keep the tick's head attached to its body. ? Do not twist or jerk the tick. This can  make the tick's head or mouth break off. ? Do not squeeze or crush the tick's body. This could force disease-carrying fluids from the tick into your body.  Do not try to remove a tick with heat, alcohol, petroleum jelly, or fingernail polish. Using these methods can cause the tick to salivate and regurgitate into your bloodstream, increasing your risk of getting a disease. What should I do after removing a tick?  Clean the bite area with soap and water, rubbing alcohol, or an iodine scrub.  If an antiseptic cream or ointment is available, apply a small amount to the bite site.  Wash and disinfect any instruments that you used to remove the tick. How should I dispose  of a tick? To dispose of a live tick, use one of these methods:  Place it in rubbing alcohol.  Place it in a sealed bag or container.  Wrap it tightly in tape.  Flush it down the toilet.  Contact a health care provider if:  You have symptoms of a disease after a tick bite. Symptoms of a tick-borne disease can occur from moments after the tick bites to up to 30 days after a tick is removed. Symptoms include: ? Muscle, joint, or bone pain. ? Difficulty walking or moving your legs. ? Numbness in the legs. ? Paralysis. ? Red rash around the tick bite area that is shaped like a target or a "bull's-eye." ? Redness and swelling in the area of the tick bite. ? Fever. ? Repeated vomiting. ? Diarrhea. ? Weight loss. ? Tender, swollen lymph glands. ? Shortness of breath. ? Cough. ? Pain in the abdomen. ? Headache. ? Abnormal tiredness. ? A change in your level of consciousness. ? Confusion. Get help right away if:  You are not able to remove a tick.  A part of a tick breaks off and gets stuck in your skin.  Your symptoms get worse. Summary  Ticks may carry germs that can spread to a person through a bite and cause disease.  Wear protective clothing and use insect repellent to prevent tick bites. Follow product instructions.  If you find a tick on your body, remove it as soon as possible. If the tick is attached, do not try to remove with heat, alcohol, petroleum jelly, or fingernail polish.  Remove the attached tick using tweezers, curved forceps, or a tick-removal tool. Gently pull with steady, upward pressure until the tick lets go. Do not twist or jerk the tick. Do not squeeze or crush the tick's body.  If you have symptoms after being bitten by a tick, contact a health care provider. This information is not intended to replace advice given to you by your health care provider. Make sure you discuss any questions you have with your health care provider. Document Released:  05/18/2000 Document Revised: 03/02/2016 Document Reviewed: 03/02/2016 Elsevier Interactive Patient Education  [employer].

## 2017-02-25 NOTE — Assessment & Plan Note (Signed)
?   cause - at site of tick bite It is asymptomatic and getting better No treatment needed, just monitor

## 2017-02-25 NOTE — Assessment & Plan Note (Signed)
Occurred three weeks ago - rash on foot at site of tick bite last week No symptoms suggestive of lyme or other tick diseases Will check for lyme Monitor for symptoms of tick diseases

## 2017-02-26 ENCOUNTER — Encounter: Payer: Self-pay | Admitting: Internal Medicine

## 2017-02-26 LAB — B. BURGDORFI ANTIBODIES

## 2017-03-11 DIAGNOSIS — N63 Unspecified lump in unspecified breast: Secondary | ICD-10-CM | POA: Diagnosis not present

## 2017-03-13 ENCOUNTER — Other Ambulatory Visit: Payer: Self-pay | Admitting: Obstetrics and Gynecology

## 2017-03-13 DIAGNOSIS — N63 Unspecified lump in unspecified breast: Secondary | ICD-10-CM

## 2017-03-18 ENCOUNTER — Ambulatory Visit
Admission: RE | Admit: 2017-03-18 | Discharge: 2017-03-18 | Disposition: A | Discharge status: Home / Self Care | Payer: 59 | Source: Ambulatory Visit | Attending: Obstetrics and Gynecology | Admitting: Obstetrics and Gynecology

## 2017-03-18 DIAGNOSIS — N63 Unspecified lump in unspecified breast: Secondary | ICD-10-CM

## 2017-03-18 DIAGNOSIS — N6323 Unspecified lump in the left breast, lower outer quadrant: Secondary | ICD-10-CM | POA: Diagnosis not present

## 2017-03-18 DIAGNOSIS — R922 Inconclusive mammogram: Secondary | ICD-10-CM | POA: Diagnosis not present

## 2017-03-18 DIAGNOSIS — N6321 Unspecified lump in the left breast, upper outer quadrant: Secondary | ICD-10-CM | POA: Diagnosis not present

## 2017-04-01 ENCOUNTER — Encounter: Payer: Self-pay | Admitting: Sports Medicine

## 2017-04-01 ENCOUNTER — Ambulatory Visit (INDEPENDENT_AMBULATORY_CARE_PROVIDER_SITE_OTHER): Payer: 59 | Admitting: Sports Medicine

## 2017-04-01 VITALS — BP 109/50 | Ht 64.0 in | Wt 112.0 lb

## 2017-04-01 DIAGNOSIS — M79645 Pain in left finger(s): Secondary | ICD-10-CM

## 2017-04-01 NOTE — Progress Notes (Signed)
   Subjective:    Patient ID: Katherine Vaughan, female    DOB: [birth year], 58 y.o.   MRN: [id number]  HPI chief complaint: Left thumb pain  Very pleasant 56 year old female comes in today complaining of 2 months of left thumb pain that she localizes to the MCP joint. She does not recall any trauma but does describe a sudden onset of pain that began after she was using a push mower to [employer] her yard. Since that time, she has had intermittent achy pain at the joint. She has not noticed any swelling. No feelings of instability. No similar problems in the past. She does have a history of carpal tunnel syndrome but denies numbness or tingling into her thumb currently. She denies pain elsewhere throughout her hand. She's not had any imaging.  Interim medical history reviewed Medications reviewed Allergies reviewed    Review of Systems    as above Objective:   Physical Exam  Well-developed, well-nourished. No acute distress. Awake alert and oriented 3. Vital signs reviewed  Examination of the left hand with attention to the left thumb shows full range of motion. No effusion. No soft tissue swelling. No triggering. She is not tender to palpation at the MCP joint. Good joint stability. Neurovascularly intact distally.  Brief MSK ultrasound of the left thumb MCP joint shows a very tiny spur off of the metacarpal head. No significant effusion. Extensor tendon is unremarkable.      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 I've also given her a thumb immobilizer to wear when active for the next 2 weeks. If symptoms persist she will return to the office for consideration of a cortisone injection. Otherwise, follow-up as needed.

## 2017-04-10 DIAGNOSIS — Z803 Family history of malignant neoplasm of breast: Secondary | ICD-10-CM | POA: Diagnosis not present

## 2017-04-10 DIAGNOSIS — Z8041 Family history of malignant neoplasm of ovary: Secondary | ICD-10-CM | POA: Diagnosis not present

## 2017-04-10 DIAGNOSIS — N6009 Solitary cyst of unspecified breast: Secondary | ICD-10-CM | POA: Diagnosis not present

## 2017-04-29 MED FILL — ESOMEPRAZOLE MAG DR 40 MG C: 40 | 90 days supply | Qty: 90 | Fill #3

## 2017-05-01 DIAGNOSIS — Z800 Family history of malignant neoplasm of digestive organs: Secondary | ICD-10-CM | POA: Diagnosis not present

## 2017-05-01 DIAGNOSIS — Z8051 Family history of malignant neoplasm of kidney: Secondary | ICD-10-CM | POA: Diagnosis not present

## 2017-05-01 DIAGNOSIS — Z808 Family history of malignant neoplasm of other organs or systems: Secondary | ICD-10-CM | POA: Diagnosis not present

## 2017-05-01 DIAGNOSIS — Z86010 Personal history of colonic polyps: Secondary | ICD-10-CM | POA: Diagnosis not present

## 2017-05-01 DIAGNOSIS — Z8041 Family history of malignant neoplasm of ovary: Secondary | ICD-10-CM | POA: Diagnosis not present

## 2017-05-01 DIAGNOSIS — Z803 Family history of malignant neoplasm of breast: Secondary | ICD-10-CM | POA: Diagnosis not present

## 2017-05-02 ENCOUNTER — Ambulatory Visit: Payer: 59 | Admitting: Sports Medicine

## 2017-05-02 DIAGNOSIS — M79645 Pain in left finger(s): Secondary | ICD-10-CM

## 2017-05-02 MED ORDER — METHYLPREDNISOLONE ACETATE 40 MG/ML IJ SUSP
20.0000 mg | Freq: Once | INTRAMUSCULAR | Status: AC
  Administered 2017-05-02: 20 mg via INTRA_ARTICULAR

## 2017-05-02 NOTE — Progress Notes (Addendum)
   Subjective:    Patient ID: Katherine Vaughan, female    DOB: 1960/10/05, 56 y.o.   MRN: [id number]  HPI chief complaint:  Left thumb pain Katherine Vaughan is a 56 year old female presenting for follow up on left thumb pain that has been present for the past 3 months and localized to the left 1st MCP joint.  She describes the pain as nagging and exacerbated when doing her ADLs such as getting dressed.  She tried a thumb splint for 2 weeks and Aspercreme 2 times a day for several weeks with little benefit.  She denies any trauma to the area.  Interim medical history reviewed Medications reviewed Allergies reviewed  Review of Systems  Musculoskeletal: Negative for joint swelling.  Skin: Negative for rash.  Neurological: Negative for weakness and numbness.       Objective:   Physical Exam  Constitutional: She appears well-developed and well-nourished.  Musculoskeletal:  Left 1st MCP joint with full range of motion, no effusion noted.  No soft tissue swelling. No triggering.  Mild tenderness to palpation to joint space.  Finkelstein's test negative.    Ultrasound of left thumb MCP joint shows a very tiny spur off of the metacarpal head. No significant effusion. Extensor tendon is unremarkable.      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  Since then patient has attempted Aspercreme and thumb immobilizer for several weeks with little benefit. Due to continued pain especially when doing ADLs will do a cortisone injection in office under ultrasound guidance.  Plan -Cortisone injection under ultrasound guidance in office   Consent obtained and verified. Time-out conducted. Noted no overlying erythema, induration, or other signs of local infection. Skin prepped in a sterile fashion. Topical analgesic spray: Ethyl chloride. Joint: 1st left MCP joint Needle: 25 gauge 5/8 inch  needle Completed without difficulty. Meds: 0.5 cc lidocaine, 0.5cc depo-medrol (20mg )  Advised to call if fevers/chills, erythema, induration, drainage, or persistent bleeding.  Patient seen and evaluated with the resident. I agree with the above plan of care. Patient's left MCP joint was injected today with cortisone under ultrasound. She tolerated this without difficulty. If symptoms persist then I would consider referral to one of the hand specialists. Follow-up for ongoing or recalcitrant issues.

## 2017-05-17 ENCOUNTER — Other Ambulatory Visit: Payer: Self-pay | Admitting: Obstetrics and Gynecology

## 2017-05-17 DIAGNOSIS — Z1231 Encounter for screening mammogram for malignant neoplasm of breast: Secondary | ICD-10-CM

## 2017-05-22 ENCOUNTER — Ambulatory Visit
Admission: RE | Admit: 2017-05-22 | Discharge: 2017-05-22 | Disposition: A | Discharge status: Home / Self Care | Payer: 59 | Source: Ambulatory Visit | Attending: Obstetrics and Gynecology | Admitting: Obstetrics and Gynecology

## 2017-05-22 DIAGNOSIS — Z1231 Encounter for screening mammogram for malignant neoplasm of breast: Secondary | ICD-10-CM | POA: Diagnosis not present

## 2017-05-23 ENCOUNTER — Other Ambulatory Visit (HOSPITAL_COMMUNITY): Payer: Self-pay | Admitting: Obstetrics and Gynecology

## 2017-05-23 DIAGNOSIS — Z803 Family history of malignant neoplasm of breast: Secondary | ICD-10-CM

## 2017-05-31 ENCOUNTER — Ambulatory Visit (HOSPITAL_COMMUNITY)
Admission: RE | Admit: 2017-05-31 | Discharge: 2017-05-31 | Disposition: A | Discharge status: Home / Self Care | Payer: 59 | Source: Ambulatory Visit | Attending: Obstetrics and Gynecology | Admitting: Obstetrics and Gynecology

## 2017-05-31 DIAGNOSIS — Z803 Family history of malignant neoplasm of breast: Secondary | ICD-10-CM | POA: Diagnosis not present

## 2017-05-31 DIAGNOSIS — Z1231 Encounter for screening mammogram for malignant neoplasm of breast: Secondary | ICD-10-CM | POA: Diagnosis not present

## 2017-05-31 DIAGNOSIS — N63 Unspecified lump in unspecified breast: Secondary | ICD-10-CM | POA: Diagnosis not present

## 2017-05-31 MED ORDER — GADOBENATE DIMEGLUMINE 529 MG/ML IV SOLN
10.0000 mL | Freq: Once | INTRAVENOUS | Status: AC | PRN
  Administered 2017-05-31: 10 mL via INTRAVENOUS

## 2017-06-12 DIAGNOSIS — Z809 Family history of malignant neoplasm, unspecified: Secondary | ICD-10-CM | POA: Diagnosis not present

## 2017-06-28 MED FILL — ESTRADIOL 0.05 MG PATCH: 0.05 | 84 days supply | Qty: 24 | Fill #1

## 2017-07-10 DIAGNOSIS — H524 Presbyopia: Secondary | ICD-10-CM | POA: Diagnosis not present

## 2017-07-12 ENCOUNTER — Telehealth: Payer: Self-pay | Admitting: Internal Medicine

## 2017-07-12 ENCOUNTER — Ambulatory Visit: Payer: 59 | Admitting: Family

## 2017-07-12 ENCOUNTER — Ambulatory Visit: Payer: Self-pay

## 2017-07-12 NOTE — Telephone Encounter (Signed)
Left voicemail for the pt to return call to the office to discuss c/o right arm fatigue.

## 2017-07-12 NOTE — Telephone Encounter (Signed)
Copied from [place]. Topic: Quick Communication - See Telephone Encounter >> Jul 12, 2017  4:23 PM Burnis Medin, NT wrote: CRM for notification. See Telephone encounter for: Per Sam from [facility] patient needs to be triaged for right arm fatigue. Pt had an appointment on 2/11  07/12/17.

## 2017-07-12 NOTE — Telephone Encounter (Signed)
  Reason for Disposition . Health Information question, no triage required and triager able to answer question  Answer Assessment - Initial Assessment Questions 1. REASON FOR CALL or QUESTION: "What is your reason for calling today?" or "How can I best help you?" or "What question do you have that I can help answer?"     Office requesting further information regarding right arm "tiredness".  Onset 3 weeks ago. Described as "tiredness" involving the right arm but feels more "tiredness" to the upper arm above the elbow. Pt states sensation is intermittent- "comes and goes". No activity makes it better or worse. Pt states she has normal function and no weakness. Denies edema, numbness, weakness, neck pain, tingling. Pt denies any recent injuries to the arm.  Protocols used: INFORMATION ONLY CALL-A-AH

## 2017-07-13 NOTE — Progress Notes (Signed)
Subjective:    Patient ID: Katherine Vaughan, female    DOB: 1961/01/19, 57 y.o.   MRN: [id number]  HPI The patient is here for an acute visit.  Right arm fatigue:  It started to be more consistent four weeks ago but she did feel it prior to that.  Sometimes it is in the lower part of the arm, but mostly in the upper right arm.   It is intermittent.   She noticed it more at night.  It is not related to activity.  She denies pain, numbness/tingling.  She feels like she wants to move it - this may or may not make it feel better.    She started doing hand weights three weeks ago.  She started this after her symptoms started to see if it would help.         Medications and allergies reviewed with patient and updated if appropriate.  Patient Active Problem List   Diagnosis Date Noted  . Thumb pain, left 05/02/2017  . Rash 02/25/2017  . Anxiety 08/13/2016  . Left hand pain 05/02/2016  . Acne 08/01/2015  . Family history of malignant neoplasm of breast 01/04/2014  . Family history of malignant neoplasm of gastrointestinal tract 01/04/2014  . Family history of malignant neoplasm of ovary 01/04/2014  . Vitamin D deficiency disease 07/04/2011  . Osteopenia   . GERD (gastroesophageal reflux disease)     Current Outpatient Medications on File Prior to Visit  Medication Sig Dispense Refill  . Ascorbic Acid (VITAMIN C) 1000 MG tablet Take 1,000 mg by mouth daily.    . Calcium Carbonate-Vitamin D (CALCIUM + D PO) Take 500 mg by mouth 2 (two) times daily.     . Cholecalciferol (VITAMIN D3) 2000 UNITS TABS Take by mouth daily.      Marland Kitchen esomeprazole (NEXIUM) 40 MG capsule Take 1 capsule (40 mg total) by mouth daily. 30 capsule 11  . estradiol (CLIMARA - DOSED IN MG/24 HR) 0.05 mg/24hr patch Place 0.05 mg onto the skin as directed. Two times weekly    . FIBER SELECT GUMMIES PO Take 2 tablets by mouth at bedtime.    . Multiple Vitamin (MULTIVITAMIN) tablet Take 1 tablet by mouth daily.      . Omega-3  Fatty Acids (FISH OIL) 1200 MG CAPS Take 1,200 mg by mouth daily.     Marland Kitchen ampicillin (PRINCIPEN) 500 MG capsule Take 500 mg by mouth every other day.       No current facility-administered medications on file prior to visit.     Past Medical History:  Diagnosis Date  . Arthritis   . GERD (gastroesophageal reflux disease)   . History of colon polyps 2006   adenomatous, no polyps in 2011  . History of hiatal hernia   . History of kidney stones   . Migraines    decreased since menopause  . Osteopenia    dexa 08/2010: -2.3 L fem  . PONV (postoperative nausea and vomiting)    only with Demerol  . Urine incontinence     Past Surgical History:  Procedure Laterality Date  . ABDOMINAL HYSTERECTOMY  2006  . BREAST EXCISIONAL BIOPSY Bilateral   . BREAST SURGERY     breast biopsy x's 2 (L) 92 and (R) 98  . COLONOSCOPY    . COLONOSCOPY WITH PROPOFOL N/A 02/11/2015   Procedure: COLONOSCOPY WITH PROPOFOL;  Surgeon: Clarene Essex, MD;  Location: [facility] ENDOSCOPY;  Service: Endoscopy;  Laterality: N/A;  .  ESOPHAGOGASTRODUODENOSCOPY N/A 02/11/2015   Procedure: ESOPHAGOGASTRODUODENOSCOPY (EGD);  Surgeon: Clarene Essex, MD;  Location: [facility] ENDOSCOPY;  Service: Endoscopy;  Laterality: N/A;  . laser endrometriosis    . SEPTOPLASTY      Social History   Socioeconomic History  . Marital status: Married    Spouse name: None  . Number of children: 0  . Years of education: None  . Highest education level: None  Social Needs  . Financial resource strain: None  . Food insecurity - worry: None  . Food insecurity - inability: None  . Transportation needs - medical: None  . Transportation needs - non-medical: None  Occupational History  . Occupation: nurse  Tobacco Use  . Smoking status: Never Smoker  . Smokeless tobacco: Never Used  Substance and Sexual Activity  . Alcohol use: No  . Drug use: No  . Sexual activity: None  Other Topics Concern  . 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 Arthritis Mother   . Diverticulitis Mother   . Arthritis Father   . COPD Father   . Diabetes Other   . Heart disease Other   . Hyperlipidemia Other   . Hypertension Other   . Stroke Other   . Breast cancer Sister 90       kidney cancer at age 86  . Diverticulitis Sister 56       s/p colectomy  . Diverticulitis Brother   . Pancreatic cancer Paternal Uncle 25  . Stomach cancer Paternal Uncle 59  . Ovarian cancer Other 68       mat great aunt through [facility] with ovarian    Review of Systems  Musculoskeletal: Negative for arthralgias, myalgias, neck pain and neck stiffness.  Skin: Negative for color change.  Neurological: Negative for weakness, light-headedness, numbness and headaches.       Objective:   Vitals:   07/15/17 0949  BP: 114/78  Pulse: 76  Resp: 16  Temp: 98 F (36.7 C)  SpO2: 99%   Wt Readings from Last 3 Encounters:  07/15/17 115 lb (52.2 kg)  05/02/17 112 lb (50.8 kg)  04/01/17 112 lb (50.8 kg)   Body mass index is 19.74 kg/m.   Physical Exam  Constitutional: She appears well-developed and well-nourished. No distress.  HENT:  Head: Normocephalic and atraumatic.  Cardiovascular: Intact distal pulses.  Musculoskeletal: She exhibits no edema.  R shoulder FROM, no tenderness in right arm with palpation  Neurological:  Normal sensation and strength in b/l UE  Skin: Skin is warm and dry. She is not diaphoretic. No erythema.        Assessment & Plan:    See Problem List for Assessment and Plan of chronic medical problems.

## 2017-07-15 ENCOUNTER — Encounter: Payer: Self-pay | Admitting: Internal Medicine

## 2017-07-15 ENCOUNTER — Ambulatory Visit: Payer: 59 | Admitting: Internal Medicine

## 2017-07-15 VITALS — BP 114/78 | HR 76 | Temp 98.0°F | Resp 16 | Wt 115.0 lb

## 2017-07-15 DIAGNOSIS — R29898 Other symptoms and signs involving the musculoskeletal system: Secondary | ICD-10-CM | POA: Insufficient documentation

## 2017-07-15 NOTE — Assessment & Plan Note (Signed)
Arm fatigue - intermittent, ? More at night No pain, numbness/tingling or weakness Deferred sports medicine referral today, but if no improvement will refer to Dr Tamala Julian  Will follow up at her appt next month

## 2017-07-15 NOTE — Patient Instructions (Addendum)
Continue to monitor your arm symptoms.      If your symptoms persist consider seeing our sports medicine doctor.

## 2017-07-23 ENCOUNTER — Other Ambulatory Visit: Payer: Self-pay | Admitting: Internal Medicine

## 2017-07-23 MED FILL — AMPICILLIN TR 500 MG CAP: 500 | 90 days supply | Qty: 90 | Fill #0

## 2017-07-23 MED FILL — ESOMEPRAZOLE MAG DR 40 MG C: 40 | 90 days supply | Qty: 90 | Fill #0

## 2017-08-14 ENCOUNTER — Encounter: Payer: 59 | Admitting: Internal Medicine

## 2017-08-18 NOTE — Progress Notes (Signed)
Subjective:    Patient ID: Katherine Vaughan, female    DOB: [birth year], 57 y.o.   MRN: [id number]  HPI     Medications and allergies reviewed with patient and updated if appropriate.  Patient Active Problem List   Diagnosis Date Noted  . Arm fatigue 07/15/2017  . Thumb pain, left 05/02/2017  . Rash 02/25/2017  . Anxiety 08/13/2016  . Left hand pain 05/02/2016  . Acne 08/01/2015  . Family history of malignant neoplasm of breast 01/04/2014  . Family history of malignant neoplasm of gastrointestinal tract 01/04/2014  . Family history of malignant neoplasm of ovary 01/04/2014  . Vitamin D deficiency disease 07/04/2011  . Osteopenia   . GERD (gastroesophageal reflux disease)     Current Outpatient Medications on File Prior to Visit  Medication Sig Dispense Refill  . ampicillin (PRINCIPEN) 500 MG capsule Take 500 mg by mouth every other day.      . Ascorbic Acid (VITAMIN C) 1000 MG tablet Take 1,000 mg by mouth daily.    . Calcium Carbonate-Vitamin D (CALCIUM + D PO) Take 500 mg by mouth 2 (two) times daily.     . Cholecalciferol (VITAMIN D3) 2000 UNITS TABS Take by mouth daily.      Marland Kitchen esomeprazole (NEXIUM) 40 MG capsule TAKE 1 CAPSULE BY MOUTH DAILY. 30 capsule 2  . estradiol (CLIMARA - DOSED IN MG/24 HR) 0.05 mg/24hr patch Place 0.05 mg onto the skin as directed. Two times weekly    . FIBER SELECT GUMMIES PO Take 2 tablets by mouth at bedtime.    . Multiple Vitamin (MULTIVITAMIN) tablet Take 1 tablet by mouth daily.      . Omega-3 Fatty Acids (FISH OIL) 1200 MG CAPS Take 1,200 mg by mouth daily.      No current facility-administered medications on file prior to visit.     Past Medical History:  Diagnosis Date  . Arthritis   . GERD (gastroesophageal reflux disease)   . History of colon polyps 2006   adenomatous, no polyps in 2011  . History of hiatal hernia   . History of kidney stones   . Migraines    decreased since menopause  . Osteopenia    dexa 08/2010: -2.3 L fem   . PONV (postoperative nausea and vomiting)    only with Demerol  . Urine incontinence     Past Surgical History:  Procedure Laterality Date  . ABDOMINAL HYSTERECTOMY  2006  . BREAST EXCISIONAL BIOPSY Bilateral   . BREAST SURGERY     breast biopsy x's 2 (L) 92 and (R) 98  . COLONOSCOPY    . COLONOSCOPY WITH PROPOFOL N/A 02/11/2015   Procedure: COLONOSCOPY WITH PROPOFOL;  Surgeon: Clarene Essex, MD;  Location[facility] ENDOSCOPY;  Service: Endoscopy;  Laterality: N/A;  . ESOPHAGOGASTRODUODENOSCOPY N/A 02/11/2015   Procedure: ESOPHAGOGASTRODUODENOSCOPY (EGD);  Surgeon: Clarene Essex, MD;  Location: [facility] ENDOSCOPY;  Service: Endoscopy;  Laterality: N/A;  . laser endrometriosis    . SEPTOPLASTY      Social History   Socioeconomic History  . Marital status: Married    Spouse name: Not on file  . Number of children: 0  . Years of education: Not on file  . Highest education level: Not on file  Social Needs  . Financial resource strain: Not on file  . Food insecurity - worry: Not on file  . Food insecurity - inability: Not on file  . Transportation needs - medical: Not on file  . Transportation  needs - non-medical: Not on file  Occupational History  . Occupation: nurse  Tobacco Use  . Smoking status: Never Smoker  . Smokeless tobacco: Never Used  Substance and Sexual Activity  . Alcohol use: No  . Drug use: No  . Sexual activity: Not on file  Other Topics Concern  . 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 Arthritis Mother   . Diverticulitis Mother   . Arthritis Father   . COPD Father   . Diabetes Other   . Heart disease Other   . Hyperlipidemia Other   . Hypertension Other   . Stroke Other   . Breast cancer Sister 70       kidney cancer at age 12  . Diverticulitis Sister 21       s/p colectomy  . Diverticulitis Brother   . Pancreatic cancer Paternal Uncle  85  . Stomach cancer Paternal Uncle 28  . Ovarian cancer Other 67       mat great aunt through [facility] with ovarian    Review of Systems     Objective:  There were no vitals filed for this visit. There were no vitals filed for this visit. There is no height or weight on file to calculate BMI.  Wt Readings from Last 3 Encounters:  07/15/17 115 lb (52.2 kg)  05/02/17 112 lb (50.8 kg)  04/01/17 112 lb (50.8 kg)     Physical Exam        Assessment & Plan:    This encounter was created in error - please disregard.

## 2017-08-20 ENCOUNTER — Encounter: Payer: 59 | Admitting: Internal Medicine

## 2017-09-03 NOTE — Patient Instructions (Addendum)
Test(s) ordered today. Your results will be released to [facility] (or called to you) after review, usually within 72hours after test completion. If any changes need to be made, you will be notified at that same time.  All other Health Maintenance issues reviewed.   All recommended immunizations and age-appropriate screenings are up-to-date or discussed.  No immunizations administered today.   Medications reviewed and updated.  No changes recommended at this time.   Please followup in one year   Health Maintenance, Female Adopting a healthy lifestyle and getting preventive care can go a long way to promote health and wellness. Talk with your health care provider about what schedule of regular examinations is right for you. This is a good chance for you to check in with your provider about disease prevention and staying healthy. In between checkups, there are plenty of things you can do on your own. Experts have done a lot of research about which lifestyle changes and preventive measures are most likely to keep you healthy. Ask your health care provider for more information. Weight and diet Eat a healthy diet  Be sure to include plenty of vegetables, fruits, low-fat dairy products, and lean protein.  Do not eat a lot of foods high in solid fats, added sugars, or salt.  Get regular exercise. This is one of the most important things you can do for your health. ? Most adults should exercise for at least 150 minutes each week. The exercise should increase your heart rate and make you sweat (moderate-intensity exercise). ? Most adults should also do strengthening exercises at least twice a week. This is in addition to the moderate-intensity exercise.  Maintain a healthy weight  Body mass index (BMI) is a measurement that can be used to identify possible weight problems. It estimates body fat based on height and weight. Your health care provider can help determine your BMI and help you achieve or  maintain a healthy weight.  For females 64 years of age and older: ? A BMI below 18.5 is considered underweight. ? A BMI of 18.5 to 24.9 is normal. ? A BMI of 25 to 29.9 is considered overweight. ? A BMI of 30 and above is considered obese.  Watch levels of cholesterol and blood lipids  You should start having your blood tested for lipids and cholesterol at 59 years of age, then have this test every 5 years.  You may need to have your cholesterol levels checked more often if: ? Your lipid or cholesterol levels are high. ? You are older than 59 years of age. ? You are at high risk for heart disease.  Cancer screening Lung Cancer  Lung cancer screening is recommended for adults 72-7 years old who are at high risk for lung cancer because of a history of smoking.  A yearly low-dose CT scan of the lungs is recommended for people who: ? Currently smoke. ? Have quit within the past 15 years. ? Have at least a 30-pack-year history of smoking. A pack year is smoking an average of one pack of cigarettes a day for 1 year.  Yearly screening should continue until it has been 15 years since you quit.  Yearly screening should stop if you develop a health problem that would prevent you from having lung cancer treatment.  Breast Cancer  Practice breast self-awareness. This means understanding how your breasts normally appear and feel.  It also means doing regular breast self-exams. Let your health care provider know about any changes,  no matter how small.  If you are in your 20s or 30s, you should have a clinical breast exam (CBE) by a health care provider every 1-3 years as part of a regular health exam.  If you are 40 or older, have a CBE every year. Also consider having a breast X-ray (mammogram) every year.  If you have a family history of breast cancer, talk to your health care provider about genetic screening.  If you are at high risk for breast cancer, talk to your health care  provider about having an MRI and a mammogram every year.  Breast cancer gene (BRCA) assessment is recommended for women who have family members with BRCA-related cancers. BRCA-related cancers include: ? Breast. ? Ovarian. ? Tubal. ? Peritoneal cancers.  Results of the assessment will determine the need for genetic counseling and BRCA1 and BRCA2 testing.  Cervical Cancer Your health care provider may recommend that you be screened regularly for cancer of the pelvic organs (ovaries, uterus, and vagina). This screening involves a pelvic examination, including checking for microscopic changes to the surface of your cervix (Pap test). You may be encouraged to have this screening done every 3 years, beginning at age 21.  For women ages 30-65, health care providers may recommend pelvic exams and Pap testing every 3 years, or they may recommend the Pap and pelvic exam, combined with testing for human papilloma virus (HPV), every 5 years. Some types of HPV increase your risk of cervical cancer. Testing for HPV may also be done on women of any age with unclear Pap test results.  Other health care providers may not recommend any screening for nonpregnant women who are considered low risk for pelvic cancer and who do not have symptoms. Ask your health care provider if a screening pelvic exam is right for you.  If you have had past treatment for cervical cancer or a condition that could lead to cancer, you need Pap tests and screening for cancer for at least 20 years after your treatment. If Pap tests have been discontinued, your risk factors (such as having a new sexual partner) need to be reassessed to determine if screening should resume. Some women have medical problems that increase the chance of getting cervical cancer. In these cases, your health care provider may recommend more frequent screening and Pap tests.  Colorectal Cancer  This type of cancer can be detected and often prevented.  Routine  colorectal cancer screening usually begins at 59 years of age and continues through 59 years of age.  Your health care provider may recommend screening at an earlier age if you have risk factors for colon cancer.  Your health care provider may also recommend using home test kits to check for hidden blood in the stool.  A small camera at the end of a tube can be used to examine your colon directly (sigmoidoscopy or colonoscopy). This is done to check for the earliest forms of colorectal cancer.  Routine screening usually begins at age 50.  Direct examination of the colon should be repeated every 5-10 years through 59 years of age. However, you may need to be screened more often if early forms of precancerous polyps or small growths are found.  Skin Cancer  Check your skin from head to toe regularly.  Tell your health care provider about any new moles or changes in moles, especially if there is a change in a mole's shape or color.  Also tell your health care provider if you   have a mole that is larger than the size of a pencil eraser.  Always use sunscreen. Apply sunscreen liberally and repeatedly throughout the day.  Protect yourself by wearing long sleeves, pants, a wide-brimmed hat, and sunglasses whenever you are outside.  Heart disease, diabetes, and high blood pressure  High blood pressure causes heart disease and increases the risk of stroke. High blood pressure is more likely to develop in: ? People who have blood pressure in the high end of the normal range (130-139/85-89 mm Hg). ? People who are overweight or obese. ? People who are African American.  If you are 24-25 years of age, have your blood pressure checked every 3-5 years. If you are 2 years of age or older, have your blood pressure checked every year. You should have your blood pressure measured twice-once when you are at a hospital or clinic, and once when you are not at a hospital or clinic. Record the average of the  two measurements. To check your blood pressure when you are not at a hospital or clinic, you can use: ? An automated blood pressure machine at a pharmacy. ? A home blood pressure monitor.  If you are between 42 years and 59 years old, ask your health care provider if you should take aspirin to prevent strokes.  Have regular diabetes screenings. This involves taking a blood sample to check your fasting blood sugar level. ? If you are at a normal weight and have a low risk for diabetes, have this test once every three years after 59 years of age. ? If you are overweight and have a high risk for diabetes, consider being tested at a younger age or more often. Preventing infection Hepatitis B  If you have a higher risk for hepatitis B, you should be screened for this virus. You are considered at high risk for hepatitis B if: ? You were born in a country where hepatitis B is common. Ask your health care provider which countries are considered high risk. ? Your parents were born in a high-risk country, and you have not been immunized against hepatitis B (hepatitis B vaccine). ? You have HIV or AIDS. ? You use needles to inject street drugs. ? You live with someone who has hepatitis B. ? You have had sex with someone who has hepatitis B. ? You get hemodialysis treatment. ? You take certain medicines for conditions, including cancer, organ transplantation, and autoimmune conditions.  Hepatitis C  Blood testing is recommended for: ? Everyone born from [birth year] through 1967. ? Anyone with known risk factors for hepatitis C.  Sexually transmitted infections (STIs)  You should be screened for sexually transmitted infections (STIs) including gonorrhea and chlamydia if: ? You are sexually active and are younger than 59 years of age. ? You are older than 59 years of age and your health care provider tells you that you are at risk for this type of infection. ? Your sexual activity has changed since you  were last screened and you are at an increased risk for chlamydia or gonorrhea. Ask your health care provider if you are at risk.  If you do not have HIV, but are at risk, it may be recommended that you take a prescription medicine daily to prevent HIV infection. This is called pre-exposure prophylaxis (PrEP). You are considered at risk if: ? You are sexually active and do not regularly use condoms or know the HIV status of your partner(s). ? You take drugs by injection. ?  You are sexually active with a partner who has HIV.  Talk with your health care provider about whether you are at high risk of being infected with HIV. If you choose to begin PrEP, you should first be tested for HIV. You should then be tested every 3 months for as long as you are taking PrEP. Pregnancy  If you are premenopausal and you may become pregnant, ask your health care provider about preconception counseling.  If you may become pregnant, take 400 to 800 micrograms (mcg) of folic acid every day.  If you want to prevent pregnancy, talk to your health care provider about birth control (contraception). Osteoporosis and menopause  Osteoporosis is a disease in which the bones lose minerals and strength with aging. This can result in serious bone fractures. Your risk for osteoporosis can be identified using a bone density scan.  If you are 65 years of age or older, or if you are at risk for osteoporosis and fractures, ask your health care provider if you should be screened.  Ask your health care provider whether you should take a calcium or vitamin D supplement to lower your risk for osteoporosis.  Menopause may have certain physical symptoms and risks.  Hormone replacement therapy may reduce some of these symptoms and risks. Talk to your health care provider about whether hormone replacement therapy is right for you. Follow these instructions at home:  Schedule regular health, dental, and eye exams.  Stay current  with your immunizations.  Do not use any tobacco products including cigarettes, chewing tobacco, or electronic cigarettes.  If you are pregnant, do not drink alcohol.  If you are breastfeeding, limit how much and how often you drink alcohol.  Limit alcohol intake to no more than 1 drink per day for nonpregnant women. One drink equals 12 ounces of beer, 5 ounces of wine, or 1 ounces of hard liquor.  Do not use street drugs.  Do not share needles.  Ask your health care provider for help if you need support or information about quitting drugs.  Tell your health care provider if you often feel depressed.  Tell your health care provider if you have ever been abused or do not feel safe at home. This information is not intended to replace advice given to you by your health care provider. Make sure you discuss any questions you have with your health care provider. Document Released: 12/04/2010 Document Revised: 10/27/2015 Document Reviewed: 02/22/2015 [employer]vier Interactive Patient Education  2018 [employer]  

## 2017-09-03 NOTE — Progress Notes (Signed)
Subjective:    Patient ID: Katherine Vaughan, female    DOB: 03-04-1961, 57 y.o.   MRN: [id number]  HPI She is here for a physical exam.   In the past 6-9 months she has had persistent ringing in her ears.  Prior to that it was intermittent.  Her hearing has gotten worse.      Medications and allergies reviewed with patient and updated if appropriate.  Patient Active Problem List   Diagnosis Date Noted  . Arm fatigue 07/15/2017  . Thumb pain, left 05/02/2017  . Rash 02/25/2017  . Anxiety 08/13/2016  . Left hand pain 05/02/2016  . Acne 08/01/2015  . Family history of malignant neoplasm of breast 01/04/2014  . Family history of malignant neoplasm of ovary 01/04/2014  . Vitamin D deficiency disease 07/04/2011  . Osteopenia   . GERD (gastroesophageal reflux disease)     Current Outpatient Medications on File Prior to Visit  Medication Sig Dispense Refill  . ampicillin (PRINCIPEN) 500 MG capsule Take 500 mg by mouth every other day.      . Ascorbic Acid (VITAMIN C) 1000 MG tablet Take 1,000 mg by mouth daily.    . Calcium Carbonate-Vitamin D (CALCIUM + D PO) Take 500 mg by mouth 2 (two) times daily.     . Cholecalciferol (VITAMIN D3) 2000 UNITS TABS Take by mouth daily.      Marland Kitchen esomeprazole (NEXIUM) 40 MG capsule TAKE 1 CAPSULE BY MOUTH DAILY. 30 capsule 2  . FIBER SELECT GUMMIES PO Take 2 tablets by mouth at bedtime.    . Multiple Vitamin (MULTIVITAMIN) tablet Take 1 tablet by mouth daily.      . Omega-3 Fatty Acids (FISH OIL) 1200 MG CAPS Take 1,200 mg by mouth daily.     Marland Kitchen tretinoin (RETIN-A) 0.05 % cream As directed    . estradiol (VIVELLE-DOT) 0.05 MG/24HR patch APPLY 1 PATCH TWICE WEEKLY AS DIRECTED  3   No current facility-administered medications on file prior to visit.     Past Medical History:  Diagnosis Date  . Arthritis   . GERD (gastroesophageal reflux disease)   . History of colon polyps 2006   adenomatous, no polyps in 2011  . History of hiatal hernia   .  History of kidney stones   . Migraines    decreased since menopause  . Osteopenia    dexa 08/2010: -2.3 L fem  . PONV (postoperative nausea and vomiting)    only with Demerol  . Urine incontinence     Past Surgical History:  Procedure Laterality Date  . ABDOMINAL HYSTERECTOMY  2006  . BREAST EXCISIONAL BIOPSY Bilateral   . BREAST SURGERY     breast biopsy x's 2 (L) 92 and (R) 98  . COLONOSCOPY    . COLONOSCOPY WITH PROPOFOL N/A 02/11/2015   Procedure: COLONOSCOPY WITH PROPOFOL;  Surgeon: Clarene Essex, MD;  Location: [facility] ENDOSCOPY;  Service: Endoscopy;  Laterality: N/A;  . ESOPHAGOGASTRODUODENOSCOPY N/A 02/11/2015   Procedure: ESOPHAGOGASTRODUODENOSCOPY (EGD);  Surgeon: Clarene Essex, MD;  Location: [facility] ENDOSCOPY;  Service: Endoscopy;  Laterality: N/A;  . laser endrometriosis    . SEPTOPLASTY      Social History   Socioeconomic History  . Marital status: Married    Spouse name: Not on file  . Number of children: 0  . Years of education: Not on file  . Highest education level: Not on file  Occupational History  . Occupation: nurse  Social Needs  . Emergency planning/management officer  strain: Not on file  . Food insecurity:    Worry: Not on file    Inability: Not on file  . Transportation needs:    Medical: Not on file    Non-medical: Not on file  Tobacco Use  . Smoking status: Never Smoker  . Smokeless tobacco: Never Used  Substance and Sexual Activity  . Alcohol use: No  . Drug use: No  . Sexual activity: Not on file  Lifestyle  . Physical activity:    Days per week: Not on file    Minutes per session: Not on file  . Stress: Not on file  Relationships  . Social connections:    Talks on phone: Not on file    Gets together: Not on file    Attends religious service: Not on file    Active member of club or organization: Not on file    Attends meetings of clubs or organizations: Not on file    Relationship status: Not on file  Other Topics Concern  . 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 Arthritis Mother   . Diverticulitis Mother   . Arthritis Father   . COPD Father   . Diabetes Other   . Heart disease Other   . Hyperlipidemia Other   . Hypertension Other   . Stroke Other   . Breast cancer Sister 24       kidney cancer at age 48  . Diverticulitis Sister 60       s/p colectomy  . Diverticulitis Brother   . Pancreatic cancer Paternal Uncle 81  . Stomach cancer Paternal Uncle 8  . Ovarian cancer Other 54       mat great aunt through [facility] with ovarian    Review of Systems  Constitutional: Negative for chills and fever.  Eyes: Negative for visual disturbance.  Respiratory: Negative for cough, shortness of breath and wheezing.   Cardiovascular: Negative for chest pain, palpitations and leg swelling.  Gastrointestinal: Negative for abdominal pain, blood in stool, constipation, diarrhea and nausea.       Gerd - occ, 2/month  Genitourinary: Negative for dysuria and hematuria.  Musculoskeletal: Positive for arthralgias (arthritis) and back pain (lower back).  Skin: Negative for color change and rash.  Neurological: Positive for headaches. Negative for light-headedness.       Objective:   Vitals:   09/04/17 0745  BP: 94/66  Pulse: 72  Resp: 16  Temp: 97.9 F (36.6 C)  SpO2: 99%   Filed Weights   09/04/17 0745  Weight: 116 lb (52.6 kg)   Body mass index is 19.91 kg/m.  Wt Readings from Last 3 Encounters:  09/04/17 116 lb (52.6 kg)  07/15/17 115 lb (52.2 kg)  05/02/17 112 lb (50.8 kg)     Physical Exam Constitutional: She appears well-developed and well-nourished. No distress.  HENT:  Head: Normocephalic and atraumatic.  Right Ear: External ear normal. Normal ear canal and TM Left Ear: External ear normal.  Normal ear canal and TM Mouth/Throat: Oropharynx is clear and moist.  Eyes: Conjunctivae and EOM are normal.    Neck: Neck supple. No tracheal deviation present. No thyromegaly present.  No carotid bruit  Cardiovascular: Normal rate, regular rhythm and normal heart sounds.   No murmur heard.  No edema. Pulmonary/Chest: Effort normal and breath sounds  normal. No respiratory distress. She has no wheezes. She has no rales.  Breast: deferred to Gyn Abdominal: Soft. She exhibits no distension. There is no tenderness.  Lymphadenopathy: She has no cervical adenopathy.  Skin: Skin is warm and dry. She is not diaphoretic.  Psychiatric: She has a normal mood and affect. Her behavior is normal.        Assessment & Plan:   Physical exam: Screening blood work  ordered Immunizations  Discussed shingrix, others up to date Colonoscopy   Up to date  Mammogram   Up to date  Gyn   Up to date  Dexa  Up to date  - due next year Eye exams   Up to date  EKG  Last done 2014 Exercise  Walking regularly, doing some squats/weights Weight  Normal BMI Skin  - sees dr Ronnald Ramp annually, no concerns today Substance abuse  none  See Problem List for Assessment and Plan of chronic medical problems.

## 2017-09-04 ENCOUNTER — Encounter: Payer: Self-pay | Admitting: Internal Medicine

## 2017-09-04 ENCOUNTER — Other Ambulatory Visit (INDEPENDENT_AMBULATORY_CARE_PROVIDER_SITE_OTHER): Payer: 59

## 2017-09-04 ENCOUNTER — Ambulatory Visit (INDEPENDENT_AMBULATORY_CARE_PROVIDER_SITE_OTHER): Payer: 59 | Admitting: Internal Medicine

## 2017-09-04 VITALS — BP 94/66 | HR 72 | Temp 97.9°F | Resp 16 | Ht 64.0 in | Wt 116.0 lb

## 2017-09-04 DIAGNOSIS — K219 Gastro-esophageal reflux disease without esophagitis: Secondary | ICD-10-CM | POA: Diagnosis not present

## 2017-09-04 DIAGNOSIS — Z0000 Encounter for general adult medical examination without abnormal findings: Secondary | ICD-10-CM

## 2017-09-04 DIAGNOSIS — M85859 Other specified disorders of bone density and structure, unspecified thigh: Secondary | ICD-10-CM | POA: Diagnosis not present

## 2017-09-04 LAB — LIPID PANEL
CHOL/HDL RATIO: 2
CHOLESTEROL: 175 mg/dL (ref 0–200)
HDL: 97.1 mg/dL (ref >39.00)
LDL CALC: 68 mg/dL (ref 0–99)
NonHDL: 77.62
Triglycerides: 46 mg/dL (ref 0.0–149.0)
VLDL: 9.2 mg/dL (ref 0.0–40.0)

## 2017-09-04 LAB — CBC WITH DIFFERENTIAL/PLATELET
BASOS PCT: 0.5 % (ref 0.0–3.0)
Basophils Absolute: 0 10*3/uL (ref 0.0–0.1)
EOS ABS: 0.1 10*3/uL (ref 0.0–0.7)
Eosinophils Relative: 0.9 % (ref 0.0–5.0)
HCT: 44.3 % (ref 36.0–46.0)
Hemoglobin: 14.8 g/dL (ref 12.0–15.0)
Lymphocytes Relative: 30.7 % (ref 12.0–46.0)
Lymphs Abs: 1.7 10*3/uL (ref 0.7–4.0)
MCHC: 33.3 g/dL (ref 30.0–36.0)
MCV: 88.4 fl (ref 78.0–100.0)
MONO ABS: 0.4 10*3/uL (ref 0.1–1.0)
Monocytes Relative: 7.5 % (ref 3.0–12.0)
NEUTROS ABS: 3.3 10*3/uL (ref 1.4–7.7)
Neutrophils Relative %: 60.4 % (ref 43.0–77.0)
Platelets: 151 10*3/uL (ref 150.0–400.0)
RBC: 5.01 Mil/uL (ref 3.87–5.11)
RDW: 13.7 % (ref 11.5–15.5)
WBC: 5.5 10*3/uL (ref 4.0–10.5)

## 2017-09-04 LAB — COMPREHENSIVE METABOLIC PANEL
ALT: 23 U/L (ref 0–35)
AST: 24 U/L (ref 0–37)
Albumin: 4 g/dL (ref 3.5–5.2)
Alkaline Phosphatase: 47 U/L (ref 39–117)
BUN: 13 mg/dL (ref 6–23)
CHLORIDE: 103 meq/L (ref 96–112)
CO2: 29 meq/L (ref 19–32)
CREATININE: 0.68 mg/dL (ref 0.40–1.20)
Calcium: 9.3 mg/dL (ref 8.4–10.5)
GFR: 94.18 mL/min (ref >60.00)
Glucose, Bld: 80 mg/dL (ref 70–99)
Potassium: 3.9 mEq/L (ref 3.5–5.1)
SODIUM: 140 meq/L (ref 135–145)
Total Bilirubin: 1.2 mg/dL (ref 0.2–1.2)
Total Protein: 6.5 g/dL (ref 6.0–8.3)

## 2017-09-04 LAB — TSH: TSH: 1.46 u[IU]/mL (ref 0.35–4.50)

## 2017-09-04 NOTE — Assessment & Plan Note (Signed)
GERD controlled Has GERD 2/ month with medication - takes otc antacid Continue daily medication

## 2017-09-04 NOTE — Assessment & Plan Note (Signed)
dexa up to date - repeat tomorrow Taking calcium and vitamin d Walking, doing weights

## 2017-09-12 MED FILL — PENICILLIN VK 500 MG TABLET: 500 | 7 days supply | Qty: 28 | Fill #0

## 2017-09-18 DIAGNOSIS — K045 Chronic apical periodontitis: Secondary | ICD-10-CM | POA: Diagnosis not present

## 2017-09-23 MED FILL — ESTRADIOL 0.05 MG PATCH: 0.05 | 84 days supply | Qty: 24 | Fill #2

## 2017-09-27 DIAGNOSIS — K045 Chronic apical periodontitis: Secondary | ICD-10-CM | POA: Diagnosis not present

## 2017-10-15 MED FILL — CHLORHEXIDINE 0.12% RINSE: 0.12 | 17 days supply | Qty: 473 | Fill #0

## 2017-10-15 MED FILL — AMOXICILLIN 500 MG CAPSULE: 500 | 10 days supply | Qty: 28 | Fill #0

## 2017-10-21 ENCOUNTER — Other Ambulatory Visit: Payer: Self-pay | Admitting: Internal Medicine

## 2017-10-21 DIAGNOSIS — D225 Melanocytic nevi of trunk: Secondary | ICD-10-CM | POA: Diagnosis not present

## 2017-10-21 DIAGNOSIS — D2271 Melanocytic nevi of right lower limb, including hip: Secondary | ICD-10-CM | POA: Diagnosis not present

## 2017-10-21 DIAGNOSIS — D2262 Melanocytic nevi of left upper limb, including shoulder: Secondary | ICD-10-CM | POA: Diagnosis not present

## 2017-10-21 DIAGNOSIS — D2261 Melanocytic nevi of right upper limb, including shoulder: Secondary | ICD-10-CM | POA: Diagnosis not present

## 2017-10-21 DIAGNOSIS — L821 Other seborrheic keratosis: Secondary | ICD-10-CM | POA: Diagnosis not present

## 2017-10-21 DIAGNOSIS — D485 Neoplasm of uncertain behavior of skin: Secondary | ICD-10-CM | POA: Diagnosis not present

## 2017-10-21 DIAGNOSIS — D2272 Melanocytic nevi of left lower limb, including hip: Secondary | ICD-10-CM | POA: Diagnosis not present

## 2017-10-21 MED FILL — ESOMEPRAZOLE MAG DR 40 MG C: 40 | 30 days supply | Qty: 30 | Fill #0

## 2017-11-05 MED FILL — AMOXICILLIN 500 MG CAPSULE: 500 | 10 days supply | Qty: 28 | Fill #1

## 2017-11-08 ENCOUNTER — Ambulatory Visit (INDEPENDENT_AMBULATORY_CARE_PROVIDER_SITE_OTHER): Payer: 59

## 2017-11-08 DIAGNOSIS — Z299 Encounter for prophylactic measures, unspecified: Secondary | ICD-10-CM | POA: Diagnosis not present

## 2017-11-13 ENCOUNTER — Ambulatory Visit: Payer: 59

## 2017-11-18 MED FILL — ESOMEPRAZOLE MAG DR 40 MG C: 40 | 60 days supply | Qty: 60 | Fill #1

## 2017-12-02 MED FILL — AMPICILLIN TR 500 MG CAP: 500 | 90 days supply | Qty: 90 | Fill #1

## 2017-12-09 ENCOUNTER — Ambulatory Visit: Payer: 59 | Admitting: Internal Medicine

## 2017-12-09 ENCOUNTER — Encounter: Payer: Self-pay | Admitting: Internal Medicine

## 2017-12-09 VITALS — BP 90/56 | HR 76 | Ht 63.39 in | Wt 112.5 lb

## 2017-12-09 DIAGNOSIS — Z8371 Family history of colonic polyps: Secondary | ICD-10-CM

## 2017-12-09 DIAGNOSIS — K219 Gastro-esophageal reflux disease without esophagitis: Secondary | ICD-10-CM

## 2017-12-09 DIAGNOSIS — Z83719 Family history of colon polyps, unspecified: Secondary | ICD-10-CM

## 2017-12-09 MED ORDER — ESOMEPRAZOLE MAGNESIUM 40 MG PO CPDR: 40.0000 mg | DELAYED_RELEASE_CAPSULE | Freq: Every day | ORAL | 11 refills | Status: DC

## 2017-12-09 NOTE — Progress Notes (Signed)
HISTORY OF PRESENT ILLNESS:  Katherine Vaughan is a 57 y.o. 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 Previous patient elsewhere. She has chronic GERD and a family history of colon polyps in multiple first-degree relatives less than 58 for which she underwent upper endoscopy and colonoscopy elsewhere 2016. At time off her last visit she was doing well on PPI. She presents today for routine follow-up and requests refill of PPI. She takes Nexium 40 mg in the morning. Rare breakthrough symptoms with dietary indiscretion. No significant dysphagia. GI review of systems is otherwise negative except for bloating and gas  REVIEW OF SYSTEMS:  All non-GI ROS negative entirely upon review  Past Medical History:  Diagnosis Date  . Arthritis   . GERD (gastroesophageal reflux disease)   . History of colon polyps 2006   adenomatous, no polyps in 2011  . History of hiatal hernia   . History of kidney stones   . Migraines    decreased since menopause  . Osteopenia    dexa 08/2010: -2.3 L fem  . PONV (postoperative nausea and vomiting)    only with Demerol  . Urine incontinence     Past Surgical History:  Procedure Laterality Date  . ABDOMINAL HYSTERECTOMY  2006  . BREAST EXCISIONAL BIOPSY Bilateral   . BREAST SURGERY     breast biopsy x's 2 (L) 92 and (R) 98  . COLONOSCOPY    . COLONOSCOPY WITH PROPOFOL N/A 02/11/2015   Procedure: COLONOSCOPY WITH PROPOFOL;  Surgeon: Clarene Essex, MD;  Location: [facility] ENDOSCOPY;  Service: Endoscopy;  Laterality: N/A;  . ESOPHAGOGASTRODUODENOSCOPY N/A 02/11/2015   Procedure: ESOPHAGOGASTRODUODENOSCOPY (EGD);  Surgeon: Clarene Essex, MD;  Location: [facility] ENDOSCOPY;  Service: Endoscopy;  Laterality: N/A;  . laser endrometriosis    . SEPTOPLASTY      Social History Katherine Vaughan  reports that she has never smoked. She has never used smokeless tobacco. She reports that  she does not drink alcohol or use drugs.  family history includes Arthritis in her father and mother; Atrial fibrillation in her father; Breast cancer (age of onset: 67) in her sister; COPD in her father; Diabetes in her other; Diverticulitis in her brother and mother; Diverticulitis (age of onset: 62) in her sister; Heart disease in her other; Hyperlipidemia in her other; Hypertension in her other; Ovarian cancer (age of onset: 53) in her other; Pancreatic cancer (age of onset: 25) in her paternal uncle; Stomach cancer (age of onset: 19) in her paternal uncle; Stroke in her other.  Allergies  Allergen Reactions  . Demerol   . Percocet [Oxycodone-Acetaminophen]   . Sulfa Antibiotics        PHYSICAL EXAMINATION: Vital signs: BP (!) 90/56 (BP Location: Left Arm, Patient Position: Sitting, Cuff Size: Normal)   Pulse 76   Ht 5' 3.39" (1.61 m)   Wt 112 lb 8 oz (51 kg)   BMI 19.69 kg/m   Constitutional: generally well-appearing, no acute distress Psychiatric: alert and oriented x3, cooperative Eyes: extraocular movements intact, anicteric, conjunctiva pink Mouth: oral pharynx moist, no lesions Neck: supple no lymphadenopathy Cardiovascular: heart regular rate and rhythm, no murmur Lungs: clear to auscultation bilaterally Abdomen: soft, nontender, nondistended, no obvious ascites, no peritoneal signs, normal bowel sounds, no organomegaly Rectal:omitted Extremities: no clubbing, cyanosis, or lower extremity edema bilaterally Skin: no lesions on visible extremities Neuro: No focal deficits. Cranial nerves intact  ASSESSMENT:  #  1. GERD. No Barrett's on previous endoscopy. Required PPI to control symptoms #2. Family history of colon polyps multiple first-degree relatives less than age 23. Negative examination 2016   PLAN:  #1. Reflux precautions #2. Continue Nexium 40 mg daily. Prescription refilled #3. We discussed current knowledge of a she regarding chronic PPI use. Questions  answered #4. Routine office follow-up one year #5. Plan surveillance colonoscopy around 2021

## 2017-12-09 NOTE — Patient Instructions (Signed)
We have sent the following medications to your pharmacy for you to pick up at your convenience:  Nexium  Please follow up in one year  

## 2017-12-23 MED FILL — ESTRADIOL 0.05 MG PATCH: 0.05 | 84 days supply | Qty: 24 | Fill #3

## 2018-01-08 DIAGNOSIS — Z01419 Encounter for gynecological examination (general) (routine) without abnormal findings: Secondary | ICD-10-CM | POA: Diagnosis not present

## 2018-01-08 DIAGNOSIS — Z6820 Body mass index (BMI) 20.0-20.9, adult: Secondary | ICD-10-CM | POA: Diagnosis not present

## 2018-01-09 ENCOUNTER — Ambulatory Visit: Payer: 59

## 2018-01-15 ENCOUNTER — Ambulatory Visit (INDEPENDENT_AMBULATORY_CARE_PROVIDER_SITE_OTHER): Payer: 59 | Admitting: *Deleted

## 2018-01-15 DIAGNOSIS — Z23 Encounter for immunization: Secondary | ICD-10-CM

## 2018-01-20 MED FILL — ESOMEPRAZOLE MAG DR 40 MG C: 40 | 90 days supply | Qty: 90 | Fill #0

## 2018-02-05 DIAGNOSIS — Z8041 Family history of malignant neoplasm of ovary: Secondary | ICD-10-CM | POA: Diagnosis not present

## 2018-02-05 DIAGNOSIS — Z803 Family history of malignant neoplasm of breast: Secondary | ICD-10-CM | POA: Diagnosis not present

## 2018-02-05 DIAGNOSIS — N83201 Unspecified ovarian cyst, right side: Secondary | ICD-10-CM | POA: Diagnosis not present

## 2018-02-05 DIAGNOSIS — N83202 Unspecified ovarian cyst, left side: Secondary | ICD-10-CM | POA: Diagnosis not present

## 2018-03-03 MED FILL — AMPICILLIN TR 500 MG CAP: 500 | 90 days supply | Qty: 90 | Fill #2

## 2018-03-17 MED FILL — ESTRADIOL 0.05 MG/24HR PTTW: 0.05 | 84 days supply | Qty: 24 | Fill #0

## 2018-04-10 ENCOUNTER — Encounter: Payer: Self-pay | Admitting: Internal Medicine

## 2018-04-14 MED FILL — ESOMEPRAZOLE MAG DR 40 MG C: 40 | 90 days supply | Qty: 90 | Fill #1

## 2018-05-13 ENCOUNTER — Ambulatory Visit (INDEPENDENT_AMBULATORY_CARE_PROVIDER_SITE_OTHER): Payer: 59 | Admitting: Sports Medicine

## 2018-05-13 ENCOUNTER — Encounter: Payer: Self-pay | Admitting: Sports Medicine

## 2018-05-13 VITALS — BP 98/70 | Ht 63.75 in | Wt 112.0 lb

## 2018-05-13 DIAGNOSIS — M25512 Pain in left shoulder: Secondary | ICD-10-CM

## 2018-05-13 MED ORDER — METHYLPREDNISOLONE ACETATE 40 MG/ML IJ SUSP
40.0000 mg | Freq: Once | INTRAMUSCULAR | Status: AC
  Administered 2018-05-13: 40 mg via INTRA_ARTICULAR

## 2018-05-13 NOTE — Progress Notes (Signed)
   CC: L shoulder pain   HPI  L shoulder pain -patient is right-handed, she has had left shoulder pain for several months.  Denies any acute injury.  She does note that she lifted a refrigerator many months ago and had some acute left shoulder pain at that time which resolved.  She cannot pinpoint a particular injury to begin this pain.  It has not been bothersome enough for her to take oral medicine, she has not tried ice or heat.  She noticed she had significantly more pain a couple of months ago when reaching into the back seat of the car with her left arm.  No history of surgery or arthritis or other pathology to the left shoulder.  She feels that it catches and has an electrical type pain with certain movements.  CC, SH/smoking status, and VS noted  Objective: BP 98/70   Ht 5' 3.75" (1.619 m)   Wt 112 lb (50.8 kg)   BMI 19.38 kg/m  Gen: NAD, alert, cooperative, and pleasant. Shoulder: R shoulder with normal active and passive ROM, normal arch. L shoulder with positive painful arch, +empty can, +Obriens, normal strength without pain of internal and external rotation. nontender over biceps groove and AC joint. No overlying skin changes.  Neuro: Alert and oriented, Speech clear, No gross deficits  Assessment and plan:  L shoulder pain: Likely rotator cuff impingement, however labral tear can also be considered given this exam.  Will treat with cortisone injection and home exercises.  Follow-up in 3 weeks to reassess.  If no significant improvement, would consider ultrasound to assess rotator cuff integrity and or MRI to assess for labral pathology.  Meds ordered this encounter  Medications  . methylPREDNISolone acetate (DEPO-MEDROL) injection 40 mg     Ralene Ok, MD, PGY3 05/13/2018 4:39 PM   Patient seen and evaluated with the resident.  I agree with the above plan of care.  Patient's left subacromial space was injected with cortisone today.  She tolerates this without  difficulty.  Home [place]obe exercise program with a follow-up appointment in 3 weeks.  If she continues to have pain, then I will start with a shoulder ultrasound to evaluate the integrity of the rotator cuff.  Patient is encouraged to call me with questions or concerns prior to her follow-up visit.  Consent obtained and verified. Time-out conducted. Noted no overlying erythema, induration, or other signs of local infection. Skin prepped in a sterile fashion. Topical analgesic spray: Ethyl chloride. Joint: left shoulder (subacromial) Needle: 25g 1.5 inch Completed without difficulty. Meds: 3cc 1% xylocaine, 1cc (40mg ) depomedrol  Advised to call if fevers/chills, erythema, induration, drainage, or persistent bleeding.

## 2018-05-30 ENCOUNTER — Other Ambulatory Visit: Payer: Self-pay | Admitting: Obstetrics and Gynecology

## 2018-05-30 DIAGNOSIS — Z1231 Encounter for screening mammogram for malignant neoplasm of breast: Secondary | ICD-10-CM

## 2018-06-02 MED FILL — AMPICILLIN TR 500 MG CAP: 500 | 90 days supply | Qty: 90 | Fill #3

## 2018-06-09 ENCOUNTER — Other Ambulatory Visit: Payer: 59 | Admitting: Sports Medicine

## 2018-06-09 MED FILL — ESTRADIOL 0.05 MG/24HR PTTW: 0.05 | 84 days supply | Qty: 24 | Fill #1

## 2018-06-11 ENCOUNTER — Ambulatory Visit (INDEPENDENT_AMBULATORY_CARE_PROVIDER_SITE_OTHER): Payer: 59 | Admitting: Sports Medicine

## 2018-06-11 ENCOUNTER — Encounter: Payer: Self-pay | Admitting: Sports Medicine

## 2018-06-11 VITALS — BP 100/60 | Ht 63.5 in | Wt 112.0 lb

## 2018-06-11 DIAGNOSIS — M25512 Pain in left shoulder: Secondary | ICD-10-CM | POA: Diagnosis not present

## 2018-06-12 NOTE — Progress Notes (Signed)
   Subjective:    Patient ID: Katherine Vaughan, female    DOB: 12-16-1960, 58 y.o.   MRN: [id number]  HPI  Katherine Vaughan comes in today for follow-up on left shoulder pain.  She did notice some improvement after her subacromial cortisone injection but she is still having great difficulty.  Certain positions such as full external rotation and abduction causes significant pain.  She does endorse catching and popping that is painful in the shoulder.  Symptoms all began with a traumatic event several weeks ago.  She has been doing her home exercises with minimal improvement.   Review of Systems    As above Objective:   Physical Exam  Well-developed, well-nourished.  No acute distress.  Awake alert and oriented x3.  Vital signs reviewed  Left shoulder: Patient has full range of motion today.  No tenderness over the acromioclavicular joint nor over the bicipital groove.  She does have a positive empty can but rotator cuff strength is 5/5.  Some pain with resisted supraspinatus.  No pain with resisted external rotation.  Positive O'Brien's.  Neurovascularly intact distally.      Assessment & Plan:   Left shoulder pain worrisome for rotator cuff tear versus labral tear.  MRI arthrogram of the left shoulder specifically to rule out rotator cuff tear versus labral tear which may require operative intervention.  Patient has had symptoms for several months that has been unresponsive to conservative treatment including a home exercise program and a recent subacromial cortisone injection.  Phone follow-up with the findings of her MRI when available.  We will delineate further treatment based on those results.  Patient agrees with this plan.

## 2018-06-20 ENCOUNTER — Ambulatory Visit
Admission: RE | Admit: 2018-06-20 | Discharge: 2018-06-20 | Disposition: A | Discharge status: Home / Self Care | Payer: 59 | Source: Ambulatory Visit | Attending: Sports Medicine | Admitting: Sports Medicine

## 2018-06-20 DIAGNOSIS — M25512 Pain in left shoulder: Secondary | ICD-10-CM

## 2018-06-20 MED ORDER — IOPAMIDOL (ISOVUE-M 200) INJECTION 41%
20.0000 mL | Freq: Once | INTRAMUSCULAR | Status: AC
  Administered 2018-06-20: 20 mL via INTRA_ARTICULAR

## 2018-06-24 ENCOUNTER — Telehealth: Payer: Self-pay | Admitting: Sports Medicine

## 2018-06-24 DIAGNOSIS — M25512 Pain in left shoulder: Secondary | ICD-10-CM

## 2018-06-24 NOTE — Telephone Encounter (Signed)
  I spoke with Katherine Vaughan on the phone today after reviewing MRI findings of her left shoulder.  There is no evidence of labral pathology.  There is a focal area in the rotator cuff which might be a tiny perforation but no full-thickness tear is seen.  Based on these findings I recommend formal physical therapy with a follow-up visit with me in 4 weeks.  Patient is in agreement with that plan.  I have encouraged her to call me with questions or concerns in the interim.

## 2018-06-25 NOTE — Addendum Note (Signed)
Addended by: Jolinda Croak E on: 06/25/2018 09:59 AM   Modules accepted: Orders

## 2018-07-02 ENCOUNTER — Ambulatory Visit
Admission: RE | Admit: 2018-07-02 | Discharge: 2018-07-02 | Disposition: A | Discharge status: Home / Self Care | Payer: 59 | Source: Ambulatory Visit | Attending: Obstetrics and Gynecology | Admitting: Obstetrics and Gynecology

## 2018-07-02 DIAGNOSIS — Z1231 Encounter for screening mammogram for malignant neoplasm of breast: Secondary | ICD-10-CM | POA: Diagnosis not present

## 2018-07-04 ENCOUNTER — Encounter: Payer: Self-pay | Admitting: Physical Therapy

## 2018-07-04 ENCOUNTER — Ambulatory Visit: Payer: 59 | Attending: Sports Medicine | Admitting: Physical Therapy

## 2018-07-04 DIAGNOSIS — M25512 Pain in left shoulder: Secondary | ICD-10-CM | POA: Insufficient documentation

## 2018-07-04 DIAGNOSIS — M6281 Muscle weakness (generalized): Secondary | ICD-10-CM

## 2018-07-04 DIAGNOSIS — G8929 Other chronic pain: Secondary | ICD-10-CM

## 2018-07-04 NOTE — Therapy (Signed)
[place], [place], [address] Phone: [phone]   Fax:  [phone]  Physical Therapy Evaluation  Patient Details  Name: Katherine Vaughan MRN: [id number] Date of Birth: [birth year] Referring Provider (PT): Thurman Coyer, DO   Encounter Date: 07/04/2018  PT End of Session - 07/04/18 0848    Visit Number  1    Number of Visits  13    Date for PT Re-Evaluation  08/15/18    PT Start Time  0800    PT Stop Time  0845    PT Time Calculation (min)  45 min    Activity Tolerance  Patient tolerated treatment well    Behavior During Therapy  [facility] for tasks assessed/performed       Past Medical History:  Diagnosis Date  . Arthritis   . GERD (gastroesophageal reflux disease)   . History of colon polyps 2006   adenomatous, no polyps in 2011  . History of hiatal hernia   . History of kidney stones   . Migraines    decreased since menopause  . Osteopenia    dexa 08/2010: -2.3 L fem  . PONV (postoperative nausea and vomiting)    only with Demerol  . Urine incontinence     Past Surgical History:  Procedure Laterality Date  . ABDOMINAL HYSTERECTOMY  2006  . BREAST EXCISIONAL BIOPSY Bilateral   . BREAST SURGERY     breast biopsy x's 2 (L) 92 and (R) 98  . COLONOSCOPY    . COLONOSCOPY WITH PROPOFOL N/A 02/11/2015   Procedure: COLONOSCOPY WITH PROPOFOL;  Surgeon: Clarene Essex, MD;  [facility] END[facility]COPY;  Service: Endoscopy;  Laterality: N/A;  . ESOPHAGOGASTRODUODENOSCOPY N/A 02/11/2015   Procedure: ESOPHAGOGASTRODUODENOSCOPY (EGD);  Surgeon: Clarene Essex, MD;  Location: [facility] ENDOSCOPY;  Service: Endoscopy;  Laterality: N/A;  . laser endrometriosis    . SEPTOPLASTY      There were no vitals filed for this visit.   Subjective Assessment - 07/04/18 0808    Subjective  pt is a 58 y.o  Fwith CC of L shoulder pain that started over a year ago. 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 Since onset she reports she feels the pain has gotten better but continues to have pain with LUE movement. she did get an injection which did help with the pain, but she reports pain is gradually increasing again which she notes could be from not doing he exercises that was given from the MD for a few weeks. she denies N/T, but has referral of pain in to the mid arm.    Limitations  Lifting    How long can you sit comfortably?  unlimited    How long can you stand comfortably?  unlimited    How long can you walk comfortably?  unlimited    Diagnostic tests  MRI: Negative MR arthrogram of the left shoulder. There is some contrast in the subacromial/subdeltoid bursa which is likely related to injection technique or less likely a focal perforation of the cuff    Patient Stated Goals  to decrease pain, return to normal activity no issues or limitation    Currently in Pain?  Yes    Pain Score  0-No pain   at worst 7/10   Pain Location  Shoulder    Pain Orientation  Left;Anterior;Lateral  Pain Descriptors / Indicators  Aching;Sore;Sharp    Pain Type  Chronic pain    Pain Radiating Towards  referral to mid lateral humerus    Pain Onset  More than a month ago    Pain Frequency  Intermittent    Aggravating Factors   reaching back (while driving) and lifting anything up with the L arm, reaching behind the back,     Pain Relieving Factors  getting out of painful position, doffing clothing reaching up overhead, heat    Effect of Pain on Daily Activities  limited mobility and lifting with LUE         [facility] PT Assessment - 07/04/18 0801      Assessment   Medical Diagnosis   Pain in joint of left shoulder     Referring Provider (PT)  Thurman Coyer, DO    Onset Date/Surgical Date  --   over a year ago   Hand Dominance  Right    Next MD Visit  07/21/2018    Prior Therapy  no      Precautions    Precautions  None      Restrictions   Weight Bearing Restrictions  No      Balance Screen   Has the patient fallen in the past 6 months  No      [place] residence    Living Arrangements  Spouse/significant other    Available Help at Discharge  Family    Type of [place] to enter    Entrance Stairs-Number of Steps  3    [place]  One level      Prior Function   Level of Independence  Independent    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     Cognition   Overall Cognitive Status  Within Functional Limits for tasks assessed      Observation/Other Assessments   Focus on Therapeutic Outcomes (FOTO)   37% limited   predicted 31% limited     Posture/Postural Control   Posture/Postural Control  Postural limitations    Postural Limitations  Rounded Shoulders;Forward head      ROM / Strength   AROM / PROM / Strength  AROM;PROM;Strength      AROM   AROM Assessment Site  Shoulder    Right/Left Shoulder  Right;Left    Right Shoulder Extension  80 Degrees    Right Shoulder Flexion  155 Degrees    Right Shoulder ABduction  156 Degrees    Right Shoulder Internal Rotation  --   T7   Right Shoulder External Rotation  --   T5   Left Shoulder Extension  70 Degrees    Left Shoulder Flexion  150 Degrees    Left Shoulder ABduction  117 Degrees    Left Shoulder Internal Rotation  --   T11   Left Shoulder External Rotation  --   T3     PROM   PROM Assessment Site  Shoulder    Right/Left Shoulder  Left      Strength   Strength Assessment Site  Shoulder;Hand    Right/Left Shoulder  Right;Left    Right Shoulder Flexion  4+/5    Right Shoulder Extension  5/5    Right Shoulder ABduction  4+/5    Right Shoulder Internal Rotation  4+/5    Right Shoulder External  Rotation  4+/5    Left Shoulder Flexion  4-/5   reproduction of concordant pain    Left Shoulder Extension  4+/5     Left Shoulder ABduction  4-/5   reproduction of concordant pain    Left Shoulder Internal Rotation  4-/5   reproduction of concordant pain    Left Shoulder External Rotation  4/5    Right Hand Grip (lbs)  60   56,65,59   Left Hand Grip (lbs)  55.6   59,53,55     Palpation   Palpation comment  TTP along the proximal long head of the biceps tendon, the lesser tubercle, and multipt trigger points noted in the L upper trap / levator scapuale      Special Tests    Special Tests  Rotator Cuff Impingement    Rotator Cuff Impingment tests  Painful Arc of Motion;Full Can test;Empty Can test;Hawkins- Kennedy test;other      Hawkins-Kennedy test   Findings  Positive    Side  Left    Comments  in both neutral and horizontal adduction pos.      Empty Can test   Findings  Negative      Full Can test   Findings  Negative      Painful Arc of Motion   Findings  Positive    Side  Left      other   Comments  scapular assist test (with abduction                Objective measurements completed on examination: See above findings.      [place] Adult PT Treatment/Exercise - 07/04/18 0801      Shoulder Exercises: Supine   Protraction  Strengthening;Left;10 reps      Shoulder Exercises: Seated   Other Seated Exercises  scapular retraction with ER 1 x 10 with red band      Shoulder Exercises: Standing   Other Standing Exercises  lower trap wall Y's 1 x 10      Shoulder Exercises: Stretch   Other Shoulder Stretches  upper trap stretch L only 2 x 30 sec             PT Education - 07/04/18 0757    Education Details  evaluation findings, POC, goals, HEP with proper form/ rationale.     Person(s) Educated  Patient    Methods  Explanation;Verbal cues;Handout    Comprehension  Verbalized understanding;Verbal cues required       PT Short Term Goals - 07/04/18 0853      PT SHORT TERM GOAL #1   Title  pt to be I with inital HEP     Time  3    Period  Weeks    Status   New    Target Date  07/25/18      PT SHORT TERM GOAL #2   Title  pt to verbalize and demo proper posture and lifting mechanics to reduce and prevent L shoulder pain    Time  3    Period  Weeks    Status  New    Target Date  07/25/18        PT Long Term Goals - 07/04/18 0855      PT LONG TERM GOAL #1   Title  Increase L shoulder IR/ER and abduction to [facility] compared bil with </= 1/10 pain for functional mobility     Time  6    Period  Weeks    Status  New    Target Date  08/15/18      PT LONG TERM GOAL #2   Title  increase L shoulder strenght to >/= 4+/5 in all planes to promote shoulder stability and assist with proper biomechanics    Time  6    Period  Weeks    Status  New    Target Date  08/15/18      PT LONG TERM GOAL #3   Title  pt to be able to lift / carrying >/= 10# with LUE in multiple positions reporting </= 1/10 pain for functional ADLS    Time  6    Period  Weeks    Status  New    Target Date  08/15/18      PT LONG TERM GOAL #4   Title  increase FOTO score to </= 31% limited to demo improvement in function     Time  6    Period  Weeks    Status  New    Target Date  08/15/18      PT LONG TERM GOAL #5   Title  pt to be I with all HEP given as of last visit to maintain and progress current level of function     Time  6    Period  Weeks    Status  New    Target Date  08/15/18             Plan - 07/04/18 0848    Clinical Impression Statement  pt presents to OPPT with CC of L shoulder pain starting over a year ago from dropping a refridgerator. she demonstrates mild limtation of ROM noted with IR/ER and abdcution compared bil. Weakness with concordant pain with resisted IR and abdcution/ flexion. special testing is consistent for potential impingement of the long head of the biceps brachii. she would benefit from physical therapy to decrease L shoulder pain, promote scapulohumeral rhythm, increase strength and overall function by addressing the deficits  listed.     History and Personal Factors relevant to plan of care:  hx of osteopenia and arthritis    Clinical Presentation  Evolving    Clinical Decision Making  Low    Rehab Potential  Excellent    PT Frequency  2x / week    PT Duration  6 weeks    PT Treatment/Interventions  ADLs/Self Care Home Management;Cryotherapy;Electrical Stimulation;Iontophoresis 4mg /ml Dexamethasone;Moist Heat;Ultrasound;Therapeutic activities;Therapeutic exercise;Patient/family education;Dry needling;Passive range of motion;Taping;Manual techniques    PT Next Visit Plan  review / update HEP PRN, peri-scapular strengthening, lifting mechanics, STW along biceps/ rhomboids, scapular mobs, modalities PRN    PT Home Exercise Plan  upper trap stretch, lower trap strengthening, ceiling punches, scapular retraction with ER     Consulted and Agree with Plan of Care  Patient       Patient will benefit from skilled therapeutic intervention in order to improve the following deficits and impairments:  Pain, Impaired UE functional use, Increased fascial restricitons, Decreased strength, Decreased endurance, Decreased activity tolerance, Improper body mechanics, Postural dysfunction, Decreased range of motion  Visit Diagnosis: Chronic left shoulder pain  Muscle weakness (generalized)     Problem List Patient Active Problem List   Diagnosis Date Noted  . Arm fatigue 07/15/2017  . Thumb pain, left 05/02/2017  . Anxiety 08/13/2016  . Acne 08/01/2015  . Family history of malignant neoplasm of breast 01/04/2014  . Family history of malignant neoplasm of ovary 01/04/2014  . Vitamin D deficiency disease  07/04/2011  . Osteopenia   . GERD (gastroesophageal reflux disease)    [place] PT, DPT, LAT, ATC  07/04/18  8:59 AM      [facility] [address] [place], [place], [address] Phone: [phone]   Fax:  [phone]  Name: Katherine Vaughan MRN:  [id number] Date of Birth: [birth year]

## 2018-07-07 ENCOUNTER — Ambulatory Visit: Payer: 59 | Attending: Sports Medicine | Admitting: Physical Therapy

## 2018-07-07 ENCOUNTER — Encounter: Payer: Self-pay | Admitting: Physical Therapy

## 2018-07-07 DIAGNOSIS — G8929 Other chronic pain: Secondary | ICD-10-CM | POA: Insufficient documentation

## 2018-07-07 DIAGNOSIS — M25512 Pain in left shoulder: Secondary | ICD-10-CM | POA: Insufficient documentation

## 2018-07-07 DIAGNOSIS — M6281 Muscle weakness (generalized): Secondary | ICD-10-CM | POA: Diagnosis not present

## 2018-07-08 ENCOUNTER — Other Ambulatory Visit (HOSPITAL_COMMUNITY): Payer: Self-pay | Admitting: Obstetrics and Gynecology

## 2018-07-08 ENCOUNTER — Other Ambulatory Visit: Payer: Self-pay | Admitting: Obstetrics and Gynecology

## 2018-07-08 DIAGNOSIS — Z803 Family history of malignant neoplasm of breast: Secondary | ICD-10-CM

## 2018-07-08 NOTE — Therapy (Signed)
[place], [place], [address] Phone: [phone]   Fax:  [phone]  Physical Therapy Treatment  Patient Details  Name: Katherine Vaughan MRN: [id number] Date of Birth: 04-02-1961 Referring Provider (PT): Thurman Coyer, DO   Encounter Date: 07/07/2018  PT End of Session - 07/07/18 1858    Visit Number  2    Number of Visits  13    Date for PT Re-Evaluation  08/15/18    PT Start Time  0740    PT Stop Time  0810    PT Time Calculation (min)  30 min    Activity Tolerance  Patient tolerated treatment well    Behavior During Therapy  [facility] for tasks assessed/performed       Past Medical History:  Diagnosis Date  . Arthritis   . GERD (gastroesophageal reflux disease)   . History of colon polyps 2006   adenomatous, no polyps in 2011  . History of hiatal hernia   . History of kidney stones   . Migraines    decreased since menopause  . Osteopenia    dexa 08/2010: -2.3 L fem  . PONV (postoperative nausea and vomiting)    only with Demerol  . Urine incontinence     Past Surgical History:  Procedure Laterality Date  . ABDOMINAL HYSTERECTOMY  2006  . BREAST EXCISIONAL BIOPSY Bilateral   . BREAST SURGERY     breast biopsy x's 2 (L) 92 and (R) 98  . COLONOSCOPY    . COLONOSCOPY WITH PROPOFOL N/A 02/11/2015   Procedure: COLONOSCOPY WITH PROPOFOL;  Surgeon: Clarene Essex, MD;  Location: [facility] ENDOSCOPY;  Service: Endoscopy;  Laterality: N/A;  . ESOPHAGOGASTRODUODENOSCOPY N/A 02/11/2015   Procedure: ESOPHAGOGASTRODUODENOSCOPY (EGD);  Surgeon: Clarene Essex, MD;  Location: [facility] ENDOSCOPY;  Service: Endoscopy;  Laterality: N/A;  . laser endrometriosis    . SEPTOPLASTY      There were no vitals filed for this visit.     Self Care:  Posture,  Scapular involvement,    Exercise:  Retraction Cervical stretching  Manual soft tissue work,  Trigger point release  Scapular ,  Upper trap,  Scapular mobilization medial  border,  Pectoralis superior..  Tenderness decreased.  Modalities: Moist heat cervical. Sitting with arm supported.                      PT Short Term Goals - 07/04/18 0853      PT SHORT TERM GOAL #1   Title  pt to be I with inital HEP     Time  3    Period  Weeks    Status  New    Target Date  07/25/18      PT SHORT TERM GOAL #2   Title  pt to verbalize and demo proper posture and lifting mechanics to reduce and prevent L shoulder pain    Time  3    Period  Weeks    Status  New    Target Date  07/25/18        PT Long Term Goals - 07/04/18 0855      PT LONG TERM GOAL #1   Title  Increase L shoulder IR/ER and abduction to [facility] compared bil with </= 1/10 pain for functional mobility     Time  6    Period  Weeks    Status  New    Target Date  08/15/18  PT LONG TERM GOAL #2   Title  increase L shoulder strenght to >/= 4+/5 in all planes to promote shoulder stability and assist with proper biomechanics    Time  6    Period  Weeks    Status  New    Target Date  08/15/18      PT LONG TERM GOAL #3   Title  pt to be able to lift / carrying >/= 10# with LUE in multiple positions reporting </= 1/10 pain for functional ADLS    Time  6    Period  Weeks    Status  New    Target Date  08/15/18      PT LONG TERM GOAL #4   Title  increase FOTO score to </= 31% limited to demo improvement in function     Time  6    Period  Weeks    Status  New    Target Date  08/15/18      PT LONG TERM GOAL #5   Title  pt to be I with all HEP given as of last visit to maintain and progress current level of function     Time  6    Period  Weeks    Status  New    Target Date  08/15/18              Patient will benefit from skilled therapeutic intervention in order to improve the following deficits and impairments:     Visit Diagnosis: Chronic left shoulder pain  Muscle weakness (generalized)     Problem List Patient Active Problem List   Diagnosis  Date Noted  . Arm fatigue 07/15/2017  . Thumb pain, left 05/02/2017  . Anxiety 08/13/2016  . Acne 08/01/2015  . Family history of malignant neoplasm of breast 01/04/2014  . Family history of malignant neoplasm of ovary 01/04/2014  . Vitamin D deficiency disease 07/04/2011  . Osteopenia   . GERD (gastroesophageal reflux disease)     ,  PTA 07/08/2018, 7:09 AM  [facility] [address] [place], [place], [address] Phone: [phone]   Fax:  [phone]  Name: Katherine Vaughan MRN: [id number] Date of Birth: September 28, 1960

## 2018-07-11 ENCOUNTER — Encounter: Payer: 59 | Admitting: Physical Therapy

## 2018-07-14 MED FILL — ESOMEPRAZOLE MAG DR 40 MG C: 40 | 90 days supply | Qty: 90 | Fill #2

## 2018-07-15 ENCOUNTER — Encounter: Payer: Self-pay | Admitting: Physical Therapy

## 2018-07-15 ENCOUNTER — Ambulatory Visit: Payer: 59 | Admitting: Physical Therapy

## 2018-07-15 DIAGNOSIS — G8929 Other chronic pain: Secondary | ICD-10-CM

## 2018-07-15 DIAGNOSIS — M6281 Muscle weakness (generalized): Secondary | ICD-10-CM

## 2018-07-15 DIAGNOSIS — M25512 Pain in left shoulder: Secondary | ICD-10-CM

## 2018-07-15 NOTE — Therapy (Signed)
[place], [place], [address] Phone: [phone]   Fax:  [phone]  Physical Therapy Treatment  Patient Details  Name: Katherine Vaughan MRN: [id number] Date of Birth: [birth year] Referring Provider (PT): Thurman Coyer, DO   Encounter Date: 07/15/2018  PT End of Session - 07/15/18 1253    Visit Number  3    Number of Visits  13    Date for PT Re-Evaluation  08/15/18    PT Start Time  0757    PT Stop Time  0842    PT Time Calculation (min)  45 min    Activity Tolerance  Patient tolerated treatment well    Behavior During Therapy  [facility] for tasks assessed/performed       Past Medical History:  Diagnosis Date  . Arthritis   . GERD (gastroesophageal reflux disease)   . History of colon polyps 2006   adenomatous, no polyps in 2011  . History of hiatal hernia   . History of kidney stones   . Migraines    decreased since menopause  . Osteopenia    dexa 08/2010: -2.3 L fem  . PONV (postoperative nausea and vomiting)    only with Demerol  . Urine incontinence     Past Surgical History:  Procedure Laterality Date  . ABDOMINAL HYSTERECTOMY  2006  . BREAST EXCISIONAL BIOPSY Bilateral   . BREAST SURGERY     breast biopsy x's 2 (L) 92 and (R) 98  . COLONOSCOPY    . COLONOSCOPY WITH PROPOFOL N/A 02/11/2015   Procedure: COLONOSCOPY WITH PROPOFOL;  Surgeon: Clarene Essex, MD;  Location[facility] ENDOSCOPY;  Service: Endoscopy;  Laterality: N/A;  . ESOPHAGOGASTRODUODENOSCOPY N/A 02/11/2015   Procedure: ESOPHAGOGASTRODUODENOSCOPY (EGD);  Surgeon: Clarene Essex, MD;  Location: [facility] ENDOSCOPY;  Service: Endoscopy;  Laterality: N/A;  . laser endrometriosis    . SEPTOPLASTY      There were no vitals filed for this visit.  Subjective Assessment - 07/15/18 0801    Subjective  Pt reports doing her exercises and feeling better results.     Currently in Pain?  No/denies                       [facility] Adult PT  Treatment/Exercise - 07/15/18 0001      Self-Care   Self-Care  Posture    Posture  Maintaining upright posture when working and how the current exercises were strengthening those postural muscles      Shoulder Exercises: Supine   Protraction  Strengthening;Left;10 reps   10 with #1; cues for pushing up and pinching scapulae back   Horizontal ABduction  Strengthening;Both;20 reps;Theraband    Theraband Level (Shoulder Horizontal ABduction)  Level 2 (Red)      Shoulder Exercises: Standing   External Rotation  Strengthening;Left;10 reps    Theraband Level (Shoulder External Rotation)  Level 2 (Red)    Internal Rotation  Strengthening;Left;10 reps    Theraband Level (Shoulder Internal Rotation)  Level 2 (Red)    Row  Strengthening;Both;12 reps;Theraband    Theraband Level (Shoulder Row)  Level 2 (Red)    Other Standing Exercises  Lower trap with red band into flexion x10    Other Standing Exercises  AROM Y's with #1 x10      Shoulder Exercises: ROM/Strengthening   UBE (Upper Arm Bike)  3 min fwd 2 min bwd      Shoulder Exercises: Stretch   Other Shoulder Stretches  upper trap stretch L only 2 x 30 sec      Manual Therapy   Manual Therapy  Soft tissue mobilization;Myofascial release;Scapular mobilization    Soft tissue mobilization  MTPR in upper traps   Noted banding with multiple trigger points   Myofascial Release  Upper traps, pectorals    Scapular Mobilization  All planes             PT Education - 07/15/18 1510    Education Details  resisted shoulder wall slides, posture ed    Person(s) Educated  Patient    Methods  Explanation;Demonstration;Handout    Comprehension  Returned demonstration;Verbalized understanding       PT Short Term Goals - 07/04/18 0853      PT SHORT TERM GOAL #1   Title  pt to be I with inital HEP     Time  3    Period  Weeks    Status  New    Target Date  07/25/18      PT SHORT TERM GOAL #2   Title  pt to verbalize and demo proper  posture and lifting mechanics to reduce and prevent L shoulder pain    Time  3    Period  Weeks    Status  New    Target Date  07/25/18        PT Long Term Goals - 07/04/18 0855      PT LONG TERM GOAL #1   Title  Increase L shoulder IR/ER and abduction to [facility] compared bil with </= 1/10 pain for functional mobility     Time  6    Period  Weeks    Status  New    Target Date  08/15/18      PT LONG TERM GOAL #2   Title  increase L shoulder strenght to >/= 4+/5 in all planes to promote shoulder stability and assist with proper biomechanics    Time  6    Period  Weeks    Status  New    Target Date  08/15/18      PT LONG TERM GOAL #3   Title  pt to be able to lift / carrying >/= 10# with LUE in multiple positions reporting </= 1/10 pain for functional ADLS    Time  6    Period  Weeks    Status  New    Target Date  08/15/18      PT LONG TERM GOAL #4   Title  increase FOTO score to </= 31% limited to demo improvement in function     Time  6    Period  Weeks    Status  New    Target Date  08/15/18      PT LONG TERM GOAL #5   Title  pt to be I with all HEP given as of last visit to maintain and progress current level of function     Time  6    Period  Weeks    Status  New    Target Date  08/15/18            Plan - 07/15/18 1527    Clinical Impression Statement  SPTA educated pt on importance of posture and how all the exercises being done were to promote that upright posture. Pt tolerated tx well and SPTA performed MTPR with STM to upper traps, pectorals, and levator scap, which relieved some of patient's pain. SPTA also suggested having PT  try DN to address trigger points.     PT Next Visit Plan  review / update HEP PRN, peri-scapular strengthening, lifting mechanics, STW along biceps/ rhomboids, scapular mobs, modalities PRN; trial DN    PT Home Exercise Plan  upper trap stretch, lower trap strengthening, ceiling punches, scapular retraction with ER, rows with TB, lower  trap with red TB    Consulted and Agree with Plan of Care  Patient     During this treatment session, the therapist was present, participating in and directing the treatment.  Patient will benefit from skilled therapeutic intervention in order to improve the following deficits and impairments:  Pain, Impaired UE functional use, Increased fascial restricitons, Decreased strength, Decreased endurance, Decreased activity tolerance, Improper body mechanics, Postural dysfunction, Decreased range of motion  Visit Diagnosis: Muscle weakness (generalized)  Chronic left shoulder pain     Problem List Patient Active Problem List   Diagnosis Date Noted  . Arm fatigue 07/15/2017  . Thumb pain, left 05/02/2017  . Anxiety 08/13/2016  . Acne 08/01/2015  . Family history of malignant neoplasm of breast 01/04/2014  . Family history of malignant neoplasm of ovary 01/04/2014  . Vitamin D deficiency disease 07/04/2011  . Osteopenia   . GERD (gastroesophageal reflux disease)     Fuller Mandril, SPTA 07/15/2018, 3:40 PM   Hessie Diener, PTA 07/16/18 8:04 AM Phone: [phone] Fax: [facility] [address] [place], [place], [address] Phone: [phone]   Fax:  [phone]  Name: Sudiksha Victor MRN: [id number] Date of Birth: Oct 19, 1960

## 2018-07-16 ENCOUNTER — Ambulatory Visit (HOSPITAL_COMMUNITY)
Admission: RE | Admit: 2018-07-16 | Discharge: 2018-07-16 | Disposition: A | Discharge status: Home / Self Care | Payer: 59 | Source: Ambulatory Visit | Attending: Obstetrics and Gynecology | Admitting: Obstetrics and Gynecology

## 2018-07-16 DIAGNOSIS — Z803 Family history of malignant neoplasm of breast: Secondary | ICD-10-CM | POA: Insufficient documentation

## 2018-07-16 DIAGNOSIS — R928 Other abnormal and inconclusive findings on diagnostic imaging of breast: Secondary | ICD-10-CM | POA: Diagnosis not present

## 2018-07-16 MED ORDER — GADOBUTROL 1 MMOL/ML IV SOLN
7.5000 mL | Freq: Once | INTRAVENOUS | Status: AC | PRN
  Administered 2018-07-16: 5 mL via INTRAVENOUS

## 2018-07-17 ENCOUNTER — Encounter: Payer: Self-pay | Admitting: Physical Therapy

## 2018-07-17 ENCOUNTER — Ambulatory Visit: Payer: 59 | Admitting: Physical Therapy

## 2018-07-17 DIAGNOSIS — M6281 Muscle weakness (generalized): Secondary | ICD-10-CM | POA: Diagnosis not present

## 2018-07-17 DIAGNOSIS — M25512 Pain in left shoulder: Secondary | ICD-10-CM | POA: Diagnosis not present

## 2018-07-17 DIAGNOSIS — G8929 Other chronic pain: Secondary | ICD-10-CM | POA: Diagnosis not present

## 2018-07-17 NOTE — Therapy (Signed)
[place], [place], [address] Phone: [phone]   Fax:  [phone]  Physical Therapy Treatment  Patient Details  Name: Katherine Vaughan MRN: [id number] Date of Birth: Feb 16, 1961 Referring Provider (PT): Thurman Coyer, DO   Encounter Date: 07/17/2018  PT End of Session - 07/17/18 0801    Visit Number  4    Number of Visits  13    Date for PT Re-Evaluation  08/15/18    PT Start Time  0801    PT Stop Time  0846    PT Time Calculation (min)  45 min    Activity Tolerance  Patient tolerated treatment well    Behavior During Therapy  [facility] for tasks assessed/performed       Past Medical History:  Diagnosis Date  . Arthritis   . GERD (gastroesophageal reflux disease)   . History of colon polyps 2006   adenomatous, no polyps in 2011  . History of hiatal hernia   . History of kidney stones   . Migraines    decreased since menopause  . Osteopenia    dexa 08/2010: -2.3 L fem  . PONV (postoperative nausea and vomiting)    only with Demerol  . Urine incontinence     Past Surgical History:  Procedure Laterality Date  . ABDOMINAL HYSTERECTOMY  2006  . BREAST EXCISIONAL BIOPSY Bilateral   . BREAST SURGERY     breast biopsy x's 2 (L) 92 and (R) 98  . COLONOSCOPY    . COLONOSCOPY WITH PROPOFOL N/A 02/11/2015   Procedure: COLONOSCOPY WITH PROPOFOL;  Surgeon: Clarene Essex, MD;  Location: [facility] ENDOSCOPY;  Service: Endoscopy;  Laterality: N/A;  . ESOPHAGOGASTRODUODENOSCOPY N/A 02/11/2015   Procedure: ESOPHAGOGASTRODUODENOSCOPY (EGD);  Surgeon: Clarene Essex, MD;  Location: [facility] ENDOSCOPY;  Service: Endoscopy;  Laterality: N/A;  . laser endrometriosis    . SEPTOPLASTY      There were no vitals filed for this visit.  Subjective Assessment - 07/17/18 0803    Subjective  " I like to say I think it is getting better but its really tough to say"     Currently in Pain?  Yes    Pain Score  1     Pain Location  Shoulder     Pain Orientation  Left    Pain Descriptors / Indicators  Aching;Sore    Pain Type  Chronic pain    Pain Onset  More than a month ago    Pain Frequency  Intermittent    Aggravating Factors   reaching back to drive         [facility] PT Assessment - 07/17/18 0001      Assessment   Medical Diagnosis   Pain in joint of left shoulder                    [facility] Adult PT Treatment/Exercise - 07/17/18 0812      Shoulder Exercises: Supine   Protraction  Strengthening;Left;15 reps   with pert     Shoulder Exercises: Seated   Other Seated Exercises  lower trap strengtheing 2 x 10 with elbows propped on bolster   with yellow band     Shoulder Exercises: ROM/Strengthening   UBE (Upper Arm Bike)  4 min L 2   changing direction at 2 min     Shoulder Exercises: Stretch   Corner Stretch  2 reps;30 seconds    Other Shoulder Stretches  upper trap stretch L  only 2 x 30 sec    Other Shoulder Stretches  rhomboid stretch 2 x 30 sec      Manual Therapy   Manual therapy comments  skilled palpation and monitoring throughout TPDN    Soft tissue mobilization  IASTM along the L upper trap / Levator scapulae    Myofascial Release  MTPR of L Rhomboids    Scapular Mobilization  upper scapular rotation grade III       Trigger Point Dry Needling - 07/17/18 0810    Consent Given?  Yes    Muscles Treated Upper Body  Upper trapezius;Levator scapulae    Upper Trapezius Response  Twitch reponse elicited;Palpable increased muscle length    Levator Scapulae Response  Twitch response elicited;Palpable increased muscle length           PT Education - 07/17/18 0819    Education Details  muscle anatomy and referral patterns. What TPDN is,benefits of tx and what to expect.       PT Short Term Goals - 07/04/18 0853      PT SHORT TERM GOAL #1   Title  pt to be I with inital HEP     Time  3    Period  Weeks    Status  New    Target Date  07/25/18      PT SHORT TERM GOAL #2   Title  pt to  verbalize and demo proper posture and lifting mechanics to reduce and prevent L shoulder pain    Time  3    Period  Weeks    Status  New    Target Date  07/25/18        PT Long Term Goals - 07/04/18 0855      PT LONG TERM GOAL #1   Title  Increase L shoulder IR/ER and abduction to [facility] compared bil with </= 1/10 pain for functional mobility     Time  6    Period  Weeks    Status  New    Target Date  08/15/18      PT LONG TERM GOAL #2   Title  increase L shoulder strenght to >/= 4+/5 in all planes to promote shoulder stability and assist with proper biomechanics    Time  6    Period  Weeks    Status  New    Target Date  08/15/18      PT LONG TERM GOAL #3   Title  pt to be able to lift / carrying >/= 10# with LUE in multiple positions reporting </= 1/10 pain for functional ADLS    Time  6    Period  Weeks    Status  New    Target Date  08/15/18      PT LONG TERM GOAL #4   Title  increase FOTO score to </= 31% limited to demo improvement in function     Time  6    Period  Weeks    Status  New    Target Date  08/15/18      PT LONG TERM GOAL #5   Title  pt to be I with all HEP given as of last visit to maintain and progress current level of function     Time  6    Period  Weeks    Status  New    Target Date  08/15/18            Plan - 07/17/18 5427  Clinical Impression Statement  pt reports soreness today which weather may attribute to her pain per pt report. educated and consent was provided for TPDN along the upper trap and levator scapuale following with throacic and scapular mobs. continued working Conservation officer, historic buildings to promote scapulohumeral rhtyhm. pt was able to flex the shoulder and reaching back with decreased pain but continued soreness with abduction was noted.     PT Next Visit Plan  how was DN. GHJ mobs for abduction/ IR, update HEP PRN, peri-scapular strengthening, lifting mechanics, STW along biceps/ rhomboids, scapular mobs, modalities PRN     PT Home Exercise Plan  upper trap stretch, lower trap strengthening, ceiling punches, scapular retraction with ER, rows with TB, lower trap with red TB    Consulted and Agree with Plan of Care  Patient       Patient will benefit from skilled therapeutic intervention in order to improve the following deficits and impairments:  Pain, Impaired UE functional use, Increased fascial restricitons, Decreased strength, Decreased endurance, Decreased activity tolerance, Improper body mechanics, Postural dysfunction, Decreased range of motion  Visit Diagnosis: Muscle weakness (generalized)  Chronic left shoulder pain     Problem List Patient Active Problem List   Diagnosis Date Noted  . Arm fatigue 07/15/2017  . Thumb pain, left 05/02/2017  . Anxiety 08/13/2016  . Acne 08/01/2015  . Family history of malignant neoplasm of breast 01/04/2014  . Family history of malignant neoplasm of ovary 01/04/2014  . Vitamin D deficiency disease 07/04/2011  . Osteopenia   . GERD (gastroesophageal reflux disease)    Starr Lake PT, DPT, LAT, ATC  07/17/18  8:52 AM      [facility] [address] [place], [place], [address] Phone: [phone]   Fax:  [phone]  Name: Gissell Barra MRN: [id number] Date of Birth: 1961/05/19

## 2018-07-21 ENCOUNTER — Ambulatory Visit: Payer: 59 | Admitting: Sports Medicine

## 2018-07-21 ENCOUNTER — Ambulatory Visit: Payer: 59 | Admitting: Physical Therapy

## 2018-07-21 ENCOUNTER — Encounter: Payer: Self-pay | Admitting: Sports Medicine

## 2018-07-21 ENCOUNTER — Encounter: Payer: Self-pay | Admitting: Physical Therapy

## 2018-07-21 VITALS — BP 100/60 | Ht 63.75 in | Wt 112.0 lb

## 2018-07-21 DIAGNOSIS — M25512 Pain in left shoulder: Secondary | ICD-10-CM | POA: Diagnosis not present

## 2018-07-21 DIAGNOSIS — G8929 Other chronic pain: Secondary | ICD-10-CM

## 2018-07-21 DIAGNOSIS — M6281 Muscle weakness (generalized): Secondary | ICD-10-CM

## 2018-07-21 NOTE — Progress Notes (Signed)
   Subjective:    Patient ID: Katherine Vaughan, female    DOB: February 20, 1961, 58 y.o.   MRN: [id number]  HPI   Katherine Vaughan comes in today for follow-up on left shoulder pain.  Unfortunately, she still struggling.  Still having difficulty with reaching directly overhead or around behind her back.  Difficulty reaching into the backseat of a car.  Recent MRI arthrogram showed no evidence of labral pathology and no significant rotator cuff tear.  There was a tiny perforation which may be a small tear in the rotator cuff but this was not definitive.  She is currently enrolled in physical therapy and has been going to therapy for the past 2 weeks.  They have tried dry needling with some success.  She is scheduled for another 2 weeks of PT.   Review of Systems    As above Objective:   Physical Exam  Well-developed, well-nourished.  No acute distress.  Awake alert and oriented x3.  Vital signs reviewed  Left shoulder: Full range of motion with a painful arc.  No tenderness to palpation.  She continues to have pain with supraspinatus stressing.  Positive empty can, positive Hawkins.  No evidence of adhesive capsulitis.  Equivocal O'Brien's.  Neurovascularly intact distally.      Assessment & Plan:   Left shoulder pain worrisome for occult rotator cuff tear  Patient has had symptoms now for several months.  Although a recent MRI arthrogram was fairly unremarkable, she is continuing to struggle with pain.  She will continue with physical therapy for now but if she does not notice improvement over the next 2 to 4 weeks she is instructed to contact me and I will refer her to Dr. Fredonia Highland to discuss diagnostic arthroscopy for possible occult rotator cuff tear.  Patient agrees with this plan.

## 2018-07-22 ENCOUNTER — Encounter: Payer: Self-pay | Admitting: Physical Therapy

## 2018-07-22 NOTE — Therapy (Signed)
[place], [place], [address] Phone: [phone]   Fax:  [phone]  Physical Therapy Treatment  Patient Details  Name: Katherine Vaughan MRN: [id number] Date of Birth: 1960/12/10 Referring Provider (PT): Thurman Coyer, DO   Encounter Date: 07/21/2018  PT End of Session - 07/21/18 1333    Visit Number  5    Number of Visits  13    Date for PT Re-Evaluation  08/15/18    PT Start Time  6967    PT Stop Time  1409    PT Time Calculation (min)  42 min    Activity Tolerance  Patient tolerated treatment well    Behavior During Therapy  [facility] for tasks assessed/performed       Past Medical History:  Diagnosis Date  . Arthritis   . GERD (gastroesophageal reflux disease)   . History of colon polyps 2006   adenomatous, no polyps in 2011  . History of hiatal hernia   . History of kidney stones   . Migraines    decreased since menopause  . Osteopenia    dexa 08/2010: -2.3 L fem  . PONV (postoperative nausea and vomiting)    only with Demerol  . Urine incontinence     Past Surgical History:  Procedure Laterality Date  . ABDOMINAL HYSTERECTOMY  2006  . BREAST EXCISIONAL BIOPSY Bilateral   . BREAST SURGERY     breast biopsy x's 2 (L) 92 and (R) 98  . COLONOSCOPY    . COLONOSCOPY WITH PROPOFOL N/A 02/11/2015   Procedure: COLONOSCOPY WITH PROPOFOL;  Surgeon: Clarene Essex, MD;  Location: [facility] ENDOSCOPY;  Service: Endoscopy;  Laterality: N/A;  . ESOPHAGOGASTRODUODENOSCOPY N/A 02/11/2015   Procedure: ESOPHAGOGASTRODUODENOSCOPY (EGD);  Surgeon: Clarene Essex, MD;  Location: [facility] ENDOSCOPY;  Service: Endoscopy;  Laterality: N/A;  . laser endrometriosis    . SEPTOPLASTY      There were no vitals filed for this visit.  Subjective Assessment - 07/21/18 1330    Subjective  Patient reports that she is reaching up a little better.  She is still feeling pain and having trouble reachning behind her back. She saw Dr Micheline Chapman  who says that she can continue with PT as long as she is feeling there is benefit.     Limitations  Lifting    How long can you sit comfortably?  unlimited    How long can you stand comfortably?  unlimited    How long can you walk comfortably?  unlimited    Diagnostic tests  MRI: Negative MR arthrogram of the left shoulder. There is some contrast in the subacromial/subdeltoid bursa which is likely related to injection technique or less likely a focal perforation of the cuff    Patient Stated Goals  to decrease pain, return to normal activity no issues or limitation    Currently in Pain?  No/denies   only with sertain positions                      [facility] Adult PT Treatment/Exercise - 07/22/18 0001      Shoulder Exercises: Supine   Protraction  Strengthening;Left;15 reps   with pert     Shoulder Exercises: Standing   External Rotation  Strengthening;Left;10 reps    Theraband Level (Shoulder External Rotation)  Level 2 (Red)    Internal Rotation  Strengthening;Left;10 reps    Theraband Level (Shoulder Internal Rotation)  Level 2 (Red)    Extension  Strengthening;20 reps;Theraband    Theraband Level (Shoulder Extension)  Level 2 (Red)    Row  Strengthening;Both;12 reps;Theraband    Theraband Level (Shoulder Row)  Level 2 (Red)      Shoulder Exercises: ROM/Strengthening   UBE (Upper Arm Bike)  4 min L 2   changing direction at 2 min     Shoulder Exercises: Stretch   Other Shoulder Stretches  upper trap stretch L only 2 x 30 sec    Other Shoulder Stretches  rhomboid stretch 2 x 30 sec      Manual Therapy   Manual Therapy  Joint mobilization    Manual therapy comments  skilled palpation and monitoring throughout TPDN    Joint Mobilization  posterior and inferior gglides of the shoulder grade 1 and II     Soft tissue mobilization  MTPR in upper traps   Noted banding with multiple trigger points   Myofascial Release  MTPR of L Rhomboids       Trigger Point Dry  Needling - 07/22/18 0816    Consent Given?  Yes    Upper Trapezius Response  Twitch reponse elicited           PT Education - 07/21/18 1333    Education Details  reveiwed benefits and risks of TPDN     Person(s) Educated  Patient    Methods  Explanation;Demonstration;Tactile cues;Verbal cues    Comprehension  Verbalized understanding;Returned demonstration;Verbal cues required;Tactile cues required;Need further instruction       PT Short Term Goals - 07/04/18 0853      PT SHORT TERM GOAL #1   Title  pt to be I with inital HEP     Time  3    Period  Weeks    Status  New    Target Date  07/25/18      PT SHORT TERM GOAL #2   Title  pt to verbalize and demo proper posture and lifting mechanics to reduce and prevent L shoulder pain    Time  3    Period  Weeks    Status  New    Target Date  07/25/18        PT Long Term Goals - 07/04/18 0855      PT LONG TERM GOAL #1   Title  Increase L shoulder IR/ER and abduction to [facility] compared bil with </= 1/10 pain for functional mobility     Time  6    Period  Weeks    Status  New    Target Date  08/15/18      PT LONG TERM GOAL #2   Title  increase L shoulder strenght to >/= 4+/5 in all planes to promote shoulder stability and assist with proper biomechanics    Time  6    Period  Weeks    Status  New    Target Date  08/15/18      PT LONG TERM GOAL #3   Title  pt to be able to lift / carrying >/= 10# with LUE in multiple positions reporting </= 1/10 pain for functional ADLS    Time  6    Period  Weeks    Status  New    Target Date  08/15/18      PT LONG TERM GOAL #4   Title  increase FOTO score to </= 31% limited to demo improvement in function     Time  6    Period  Weeks  Status  New    Target Date  08/15/18      PT LONG TERM GOAL #5   Title  pt to be I with all HEP given as of last visit to maintain and progress current level of function     Time  6    Period  Weeks    Status  New    Target Date  08/15/18             Plan - 07/21/18 1405    Clinical Impression Statement  Great twithc respose noted in the upper trap. 2 spots in the upper trap needled using a 30x30 needle. She tolerated ther-ex well. She had pain with end range IR PROM today but overall her range is good. She has significant spasmingin her neck and upper trap.     Clinical Presentation  Evolving    Clinical Decision Making  Low    Rehab Potential  Excellent    PT Frequency  2x / week    PT Duration  6 weeks    PT Treatment/Interventions  ADLs/Self Care Home Management;Cryotherapy;Electrical Stimulation;Iontophoresis 4mg /ml Dexamethasone;Moist Heat;Ultrasound;Therapeutic activities;Therapeutic exercise;Patient/family education;Dry needling;Passive range of motion;Taping;Manual techniques    PT Next Visit Plan  how was DN. GHJ mobs for abduction/ IR, update HEP PRN, peri-scapular strengthening, lifting mechanics, STW along biceps/ rhomboids, scapular mobs, modalities PRN    PT Home Exercise Plan  upper trap stretch, lower trap strengthening, ceiling punches, scapular retraction with ER, rows with TB, lower trap with red TB    Consulted and Agree with Plan of Care  Patient       Patient will benefit from skilled therapeutic intervention in order to improve the following deficits and impairments:  Pain, Impaired UE functional use, Increased fascial restricitons, Decreased strength, Decreased endurance, Decreased activity tolerance, Improper body mechanics, Postural dysfunction, Decreased range of motion  Visit Diagnosis: Muscle weakness (generalized)  Chronic left shoulder pain     Problem List Patient Active Problem List   Diagnosis Date Noted  . Arm fatigue 07/15/2017  . Thumb pain, left 05/02/2017  . Anxiety 08/13/2016  . Acne 08/01/2015  . Family history of malignant neoplasm of breast 01/04/2014  . Family history of malignant neoplasm of ovary 01/04/2014  . Vitamin D deficiency disease 07/04/2011  . Osteopenia    . GERD (gastroesophageal reflux disease)     Carney Living PT DPT  07/22/2018, 8:19 AM  [facility] [address] [place], [place], [address] Phone: [phone]   Fax:  [phone]  Name: Katherine Vaughan MRN: [id number] Date of Birth: 1961/03/20

## 2018-07-25 ENCOUNTER — Ambulatory Visit: Payer: 59 | Admitting: Physical Therapy

## 2018-07-30 ENCOUNTER — Ambulatory Visit: Payer: 59 | Admitting: Physical Therapy

## 2018-07-30 ENCOUNTER — Encounter: Payer: Self-pay | Admitting: Physical Therapy

## 2018-07-30 DIAGNOSIS — M6281 Muscle weakness (generalized): Secondary | ICD-10-CM | POA: Diagnosis not present

## 2018-07-30 DIAGNOSIS — G8929 Other chronic pain: Secondary | ICD-10-CM | POA: Diagnosis not present

## 2018-07-30 DIAGNOSIS — M25512 Pain in left shoulder: Secondary | ICD-10-CM | POA: Diagnosis not present

## 2018-07-30 NOTE — Therapy (Signed)
[place], [place], [address] Phone: [phone]   Fax:  [phone]  Physical Therapy Treatment  Patient Details  Name: Katherine Vaughan MRN: [id number] Date of Birth: 17-May-1961 Referring Provider (PT): Katherine Coyer, DO   Encounter Date: 07/30/2018  PT End of Session - 07/30/18 1548    Visit Number  6    Number of Visits  13    Date for PT Re-Evaluation  08/15/18    PT Start Time  2010    PT Stop Time  1630    PT Time Calculation (min)  41 min    Activity Tolerance  Patient tolerated treatment well    Behavior During Therapy  [facility] for tasks assessed/performed       Past Medical History:  Diagnosis Date  . Arthritis   . GERD (gastroesophageal reflux disease)   . History of colon polyps 2006   adenomatous, no polyps in 2011  . History of hiatal hernia   . History of kidney stones   . Migraines    decreased since menopause  . Osteopenia    dexa 08/2010: -2.3 L fem  . PONV (postoperative nausea and vomiting)    only with Demerol  . Urine incontinence     Past Surgical History:  Procedure Laterality Date  . ABDOMINAL HYSTERECTOMY  2006  . BREAST EXCISIONAL BIOPSY Bilateral   . BREAST SURGERY     breast biopsy x's 2 (L) 92 and (R) 98  . COLONOSCOPY    . COLONOSCOPY WITH PROPOFOL N/A 02/11/2015   Procedure: COLONOSCOPY WITH PROPOFOL;  Surgeon: Katherine Essex, MD;  Location: [facility] ENDOSCOPY;  Service: Endoscopy;  Laterality: N/A;  . ESOPHAGOGASTRODUODENOSCOPY N/A 02/11/2015   Procedure: ESOPHAGOGASTRODUODENOSCOPY (EGD);  Surgeon: Katherine Essex, MD;  Location: [facility] ENDOSCOPY;  Service: Endoscopy;  Laterality: N/A;  . laser endrometriosis    . SEPTOPLASTY      There were no vitals filed for this visit.  Subjective Assessment - 07/30/18 1549    Subjective  I don't feel like it is getting better. Upper trap is less sore but still hurts to move.     Patient Stated Goals  to decrease pain, return to normal  activity no issues or limitation    Currently in Pain?  No/denies   at rest                      [facility] Adult PT Treatment/Exercise - 07/30/18 0001      Manual Therapy   Manual Therapy  Taping    Manual therapy comments  skilled palpation and monitoring throughout TPDN    Kinesiotex  Facilitate Muscle      Kinesiotix   Facilitate Muscle   wrap around anterior shoulder to thoracic for GHJ ER       Trigger Point Dry Needling - 07/30/18 1630    Muscles Treated Upper Body  --   anterior deltoid          PT Education - 07/30/18 1727    Education Details  discussed symptoms, limitations, MOI, POC    Person(s) Educated  Patient    Methods  Explanation    Comprehension  Verbalized understanding       PT Short Term Goals - 07/04/18 0853      PT SHORT TERM GOAL #1   Title  pt to be I with inital HEP     Time  3    Period  Weeks  Status  New    Target Date  07/25/18      PT SHORT TERM GOAL #2   Title  pt to verbalize and demo proper posture and lifting mechanics to reduce and prevent L shoulder pain    Time  3    Period  Weeks    Status  New    Target Date  07/25/18        PT Long Term Goals - 07/04/18 0855      PT LONG TERM GOAL #1   Title  Increase L shoulder IR/ER and abduction to [facility] compared bil with </= 1/10 pain for functional mobility     Time  6    Period  Weeks    Status  New    Target Date  08/15/18      PT LONG TERM GOAL #2   Title  increase L shoulder strenght to >/= 4+/5 in all planes to promote shoulder stability and assist with proper biomechanics    Time  6    Period  Weeks    Status  New    Target Date  08/15/18      PT LONG TERM GOAL #3   Title  pt to be able to lift / carrying >/= 10# with LUE in multiple positions reporting </= 1/10 pain for functional ADLS    Time  6    Period  Weeks    Status  New    Target Date  08/15/18      PT LONG TERM GOAL #4   Title  increase FOTO score to </= 31% limited to demo  improvement in function     Time  6    Period  Weeks    Status  New    Target Date  08/15/18      PT LONG TERM GOAL #5   Title  pt to be I with all HEP given as of last visit to maintain and progress current level of function     Time  6    Period  Weeks    Status  New    Target Date  08/15/18            Plan - 07/30/18 1722    Clinical Impression Statement  Pt is very frustrated because she wants her arm to be better. DN to anterior deltoid and tape to discourage forward shift of humeral head. Pt denied changes in her ability to perform painful motions. Advised that she make the appointment with Katherine Vaughan as she is already a current patient and we will continue to treat her if it is appropriate.     PT Treatment/Interventions  ADLs/Self Care Home Management;Cryotherapy;Electrical Stimulation;Iontophoresis 4mg /ml Dexamethasone;Moist Heat;Ultrasound;Therapeutic activities;Therapeutic exercise;Patient/family education;Dry needling;Passive range of motion;Taping;Manual techniques    PT Next Visit Plan  periscap strengthening, try biceps work    PT Home Exercise Plan  upper trap stretch, lower trap strengthening, ceiling punches, scapular retraction with ER, rows with TB, lower trap with red TB    Consulted and Agree with Plan of Care  Patient       Patient will benefit from skilled therapeutic intervention in order to improve the following deficits and impairments:  Pain, Impaired UE functional use, Increased fascial restricitons, Decreased strength, Decreased endurance, Decreased activity tolerance, Improper body mechanics, Postural dysfunction, Decreased range of motion  Visit Diagnosis: Muscle weakness (generalized)  Chronic left shoulder pain     Problem List Patient Active Problem List   Diagnosis  Date Noted  . Arm fatigue 07/15/2017  . Thumb pain, left 05/02/2017  . Anxiety 08/13/2016  . Acne 08/01/2015  . Family history of malignant neoplasm of breast 01/04/2014  .  Family history of malignant neoplasm of ovary 01/04/2014  . Vitamin D deficiency disease 07/04/2011  . Osteopenia   . GERD (gastroesophageal reflux disease)    Katherine Vaughan C. Jaylen Claude PT, DPT 07/30/18 5:28 PM   [facility] [address] [place], [place], [address] Phone: [phone]   Fax:  [phone]  Name: Katherine Vaughan MRN: [id number] Date of Birth: 1961/04/27

## 2018-08-01 ENCOUNTER — Ambulatory Visit: Payer: 59 | Admitting: Physical Therapy

## 2018-08-01 ENCOUNTER — Encounter: Payer: Self-pay | Admitting: Physical Therapy

## 2018-08-01 DIAGNOSIS — G8929 Other chronic pain: Secondary | ICD-10-CM | POA: Diagnosis not present

## 2018-08-01 DIAGNOSIS — M25512 Pain in left shoulder: Secondary | ICD-10-CM

## 2018-08-01 DIAGNOSIS — M6281 Muscle weakness (generalized): Secondary | ICD-10-CM

## 2018-08-01 NOTE — Patient Instructions (Addendum)

## 2018-08-01 NOTE — Therapy (Signed)
[place], [place], [address] Phone: [phone]   Fax:  [phone]  Physical Therapy Treatment  Patient Details  Name: Katherine Vaughan MRN: [id number] Date of Birth: 1960/07/04 Referring Provider (PT): Thurman Coyer, DO   Encounter Date: 08/01/2018  PT End of Session - 08/01/18 0804    Visit Number  7    Number of Visits  13    Date for PT Re-Evaluation  08/15/18    PT Start Time  0804    PT Stop Time  3976    PT Time Calculation (min)  40 min    Activity Tolerance  Patient tolerated treatment well    Behavior During Therapy  [facility] for tasks assessed/performed       Past Medical History:  Diagnosis Date  . Arthritis   . GERD (gastroesophageal reflux disease)   . History of colon polyps 2006   adenomatous, no polyps in 2011  . History of hiatal hernia   . History of kidney stones   . Migraines    decreased since menopause  . Osteopenia    dexa 08/2010: -2.3 L fem  . PONV (postoperative nausea and vomiting)    only with Demerol  . Urine incontinence     Past Surgical History:  Procedure Laterality Date  . ABDOMINAL HYSTERECTOMY  2006  . BREAST EXCISIONAL BIOPSY Bilateral   . BREAST SURGERY     breast biopsy x's 2 (L) 92 and (R) 98  . COLONOSCOPY    . COLONOSCOPY WITH PROPOFOL N/A 02/11/2015   Procedure: COLONOSCOPY WITH PROPOFOL;  Surgeon: Clarene Essex, MD;  Location: [facility] ENDOSCOPY;  Service: Endoscopy;  Laterality: N/A;  . ESOPHAGOGASTRODUODENOSCOPY N/A 02/11/2015   Procedure: ESOPHAGOGASTRODUODENOSCOPY (EGD);  Surgeon: Clarene Essex, MD;  Location: [facility] ENDOSCOPY;  Service: Endoscopy;  Laterality: N/A;  . laser endrometriosis    . SEPTOPLASTY      There were no vitals filed for this visit.  Subjective Assessment - 08/01/18 0804    Subjective  "I am doing better today, I am being optomistic"     Currently in Pain?  Yes    Pain Score  5     Pain Orientation  Left    Pain Type  Chronic  pain    Pain Onset  More than a month ago    Pain Frequency  Intermittent    Aggravating Factors   reaching back and raising the L arm.         [facility] PT Assessment - 08/01/18 0001      Assessment   Medical Diagnosis   Pain in joint of left shoulder     Referring Provider (PT)  Thurman Coyer, DO                   [facility] Adult PT Treatment/Exercise - 08/01/18 0823      Shoulder Exercises: ROM/Strengthening   Other ROM/Strengthening Exercises  tricep press 2 x 10 7# using free motion      Shoulder Exercises: Stretch   Other Shoulder Stretches  bicep 3 way stretch holding 30 sec      Modalities   Modalities  Iontophoresis      Iontophoresis   Type of Iontophoresis  Dexamethasone    Location  anterior shoulder    Dose  4mg /ml    Time  6 hour patch      Manual Therapy   Manual therapy comments  skilled palpation and monitoring throughout TPDN  Soft tissue mobilization  IASTM along the biceps brachii/ deltoid    Kinesiotex  Inhibit Muscle;Facilitate Muscle      Kinesiotix   Inhibit Muscle   bicep brachii    Facilitate Muscle   bil rhomboids       Trigger Point Dry Needling - 08/01/18 0825    Consent Given?  Yes    Education Handout Provided  No    Muscles Treated Upper Body  Subscapularis   bicep brachii, , anterior deltoid   Subscapularis Response  Twitch response elicited;Palpable increased muscle length           PT Education - 08/01/18 0851    Education Details  updated HEP for bicep stretching and educated about iontophoresis    Person(s) Educated  Patient    Methods  Explanation;Verbal cues    Comprehension  Verbalized understanding;Verbal cues required       PT Short Term Goals - 07/04/18 0853      PT SHORT TERM GOAL #1   Title  pt to be I with inital HEP     Time  3    Period  Weeks    Status  New    Target Date  07/25/18      PT SHORT TERM GOAL #2   Title  pt to verbalize and demo proper posture and lifting mechanics to  reduce and prevent L shoulder pain    Time  3    Period  Weeks    Status  New    Target Date  07/25/18        PT Long Term Goals - 07/04/18 0855      PT LONG TERM GOAL #1   Title  Increase L shoulder IR/ER and abduction to [facility] compared bil with </= 1/10 pain for functional mobility     Time  6    Period  Weeks    Status  New    Target Date  08/15/18      PT LONG TERM GOAL #2   Title  increase L shoulder strenght to >/= 4+/5 in all planes to promote shoulder stability and assist with proper biomechanics    Time  6    Period  Weeks    Status  New    Target Date  08/15/18      PT LONG TERM GOAL #3   Title  pt to be able to lift / carrying >/= 10# with LUE in multiple positions reporting </= 1/10 pain for functional ADLS    Time  6    Period  Weeks    Status  New    Target Date  08/15/18      PT LONG TERM GOAL #4   Title  increase FOTO score to </= 31% limited to demo improvement in function     Time  6    Period  Weeks    Status  New    Target Date  08/15/18      PT LONG TERM GOAL #5   Title  pt to be I with all HEP given as of last visit to maintain and progress current level of function     Time  6    Period  Weeks    Status  New    Target Date  08/15/18            Plan - 08/01/18 0847    Clinical Impression Statement  pt reports improvement of pain since the last session. continued  TPDN focusing on the bicep, anterior deltoid and subscapularis followed with IASTM techniques and KT taping to inhibit the biceps and activate the rhomboids. educated and applied iontophoresis to the anterior aspect of the shoulder.     PT Treatment/Interventions  ADLs/Self Care Home Management;Cryotherapy;Electrical Stimulation;Iontophoresis 4mg /ml Dexamethasone;Moist Heat;Ultrasound;Therapeutic activities;Therapeutic exercise;Patient/family education;Dry needling;Passive range of motion;Taping;Manual techniques    PT Next Visit Plan  periscap strengthening, , DN, bicep, anterior  deltoid, try latissmus dorsi, KT taping if helpful, ho wwas ionto?    PT Home Exercise Plan  upper trap stretch, lower trap strengthening, ceiling punches, scapular retraction with ER, rows with TB, lower trap with red TB, 3 way bicep stretch       Patient will benefit from skilled therapeutic intervention in order to improve the following deficits and impairments:  Pain, Impaired UE functional use, Increased fascial restricitons, Decreased strength, Decreased endurance, Decreased activity tolerance, Improper body mechanics, Postural dysfunction, Decreased range of motion  Visit Diagnosis: Muscle weakness (generalized)  Chronic left shoulder pain     Problem List Patient Active Problem List   Diagnosis Date Noted  . Arm fatigue 07/15/2017  . Thumb pain, left 05/02/2017  . Anxiety 08/13/2016  . Acne 08/01/2015  . Family history of malignant neoplasm of breast 01/04/2014  . Family history of malignant neoplasm of ovary 01/04/2014  . Vitamin D deficiency disease 07/04/2011  . Osteopenia   . GERD (gastroesophageal reflux disease)    Starr Lake PT, DPT, LAT, ATC  08/01/18  8:52 AM      [place] [facility] [address] [place], [place], [address] Phone: [phone]   Fax:  [phone]  Name: Katherine Vaughan MRN: [id number] Date of Birth: [birth year]

## 2018-08-08 ENCOUNTER — Encounter

## 2018-08-15 ENCOUNTER — Other Ambulatory Visit: Payer: Self-pay

## 2018-08-15 ENCOUNTER — Ambulatory Visit: Payer: 59 | Attending: Sports Medicine | Admitting: Physical Therapy

## 2018-08-15 ENCOUNTER — Telehealth: Payer: Self-pay

## 2018-08-15 DIAGNOSIS — M25512 Pain in left shoulder: Secondary | ICD-10-CM

## 2018-08-15 DIAGNOSIS — M6281 Muscle weakness (generalized): Secondary | ICD-10-CM | POA: Diagnosis not present

## 2018-08-15 DIAGNOSIS — G8929 Other chronic pain: Secondary | ICD-10-CM | POA: Diagnosis not present

## 2018-08-15 NOTE — Therapy (Signed)
[place], [place], [address] Phone: [phone]   Fax:  [phone]  Physical Therapy Treatment / Discharge Summary  Patient Details  Name: Lorelee Mclaurin MRN: [id number] Date of Birth: [birth year] Referring Provider (PT): Thurman Coyer, DO   Encounter Date: 08/15/2018  PT End of Session - 08/15/18 0801    Visit Number  8    Number of Visits  13    Date for PT Re-Evaluation  08/15/18    PT Start Time  0801    PT Stop Time  0834    PT Time Calculation (min)  33 min    Activity Tolerance  Patient tolerated treatment well    Behavior During Therapy  [facility] for tasks assessed/performed       Past Medical History:  Diagnosis Date  . Arthritis   . GERD (gastroesophageal reflux disease)   . History of colon polyps 2006   adenomatous, no polyps in 2011  . History of hiatal hernia   . History of kidney stones   . Migraines    decreased since menopause  . Osteopenia    dexa 08/2010: -2.3 L fem  . PONV (postoperative nausea and vomiting)    only with Demerol  . Urine incontinence     Past Surgical History:  Procedure Laterality Date  . ABDOMINAL HYSTERECTOMY  2006  . BREAST EXCISIONAL BIOPSY Bilateral   . BREAST SURGERY     breast biopsy x's 2 (L) 92 and (R) 98  . COLONOSCOPY    . COLONOSCOPY WITH PROPOFOL N/A 02/11/2015   Procedure: COLONOSCOPY WITH PROPOFOL;  Surgeon: Clarene Essex, MD;  [facility]COPY;  Service: Endoscopy;  Laterality: N/A;  . ESOPHAGOGASTRODUODENOSCOPY N/A 02/11/2015   Procedure: ESOPHAGOGASTRODUODENOSCOPY (EGD);  Surgeon: Clarene Essex, MD;  Location: [facility] ENDOSCOPY;  Service: Endoscopy;  Laterality: N/A;  . laser endrometriosis    . SEPTOPLASTY      There were no vitals filed for this visit.  Subjective Assessment - 08/15/18 0803    Subjective  "I am still feeling about the same"     Patient Stated Goals  to decrease pain, return to normal activity no issues or limitation     Currently in Pain?  Yes    Pain Score  6     Pain Location  Shoulder    Pain Orientation  Left    Pain Descriptors / Indicators  Aching;Sore    Pain Type  Chronic pain    Pain Onset  More than a month ago    Pain Frequency  Intermittent    Aggravating Factors   reaching behind the back    Pain Relieving Factors  heating, change of position.          [facility] PT Assessment - 08/15/18 0805      Assessment   Medical Diagnosis   Pain in joint of left shoulder     Referring Provider (PT)  Thurman Coyer, DO    Hand Dominance  Right      Observation/Other Assessments   Focus on Therapeutic Outcomes (FOTO)   38% limited      AROM   Left Shoulder Extension  51 Degrees   end range pain   Left Shoulder Flexion  138 Degrees   ERP   Left Shoulder ABduction  95 Degrees    Left Shoulder Internal Rotation  --   t12   Left Shoulder External Rotation  --   T3  Strength   Left Shoulder Flexion  4-/5    Left Shoulder Extension  4/5   pain during testing   Left Shoulder ABduction  4-/5   pain during testing   Left Shoulder Internal Rotation  4/5   pain during testing   Left Hand Grip (lbs)  55.6   56,56,55     Palpation   Palpation comment  TTP along the proximal long head of the biceps tendon, the lesser tubercle, and multipt trigger points noted in the L upper trap / levator scapuale                   [facility] Adult PT Treatment/Exercise - 08/15/18 0001      Shoulder Exercises: Seated   Row  Strengthening;10 reps;Theraband    Theraband Level (Shoulder Row)  Level 3 (Green)    External Rotation  Strengthening;Left;10 reps;Theraband    Theraband Level (Shoulder External Rotation)  Level 3 (Green)    Internal Rotation  Strengthening;Left;10 reps;Theraband    Theraband Level (Shoulder Internal Rotation)  Level 3 Nyoka Cowden)             PT Education - 08/15/18 [phone]    Education Details  reviewed previously provided HEP and benefits of continued strengtheneing  to maintain current level of function.     Person(s) Educated  Patient    Methods  Explanation;Verbal cues    Comprehension  Verbal cues required;Verbalized understanding       PT Short Term Goals - 08/15/18 0820      PT SHORT TERM GOAL #1   Title  pt to be I with inital HEP     Time  3    Period  Weeks    Status  Achieved      PT SHORT TERM GOAL #2   Title  pt to verbalize and demo proper posture and lifting mechanics to reduce and prevent L shoulder pain    Time  3    Period  Weeks    Status  Achieved        PT Long Term Goals - 08/15/18 1165      PT LONG TERM GOAL #1   Title  Increase L shoulder IR/ER and abduction to [facility] compared bil with </= 1/10 pain for functional mobility     Baseline  reduction in ROM and cotninued pain     Time  6    Period  Weeks    Status  Not Met      PT LONG TERM GOAL #2   Title  increase L shoulder strenght to >/= 4+/5 in all planes to promote shoulder stability and assist with proper biomechanics    Baseline  4-/5 in all planes with pain     Time  6    Period  Weeks    Status  Not Met      PT LONG TERM GOAL #3   Title  pt to be able to lift / carrying >/= 10# with LUE in multiple positions reporting </= 1/10 pain for functional ADLS    Time  6    Period  Weeks    Status  Not Met      PT LONG TERM GOAL #4   Title  increase FOTO score to </= 31% limited to demo improvement in function     Time  6    Period  Weeks    Status  Not Met      PT LONG TERM GOAL #5  Title  pt to be I with all HEP given as of last visit to maintain and progress current level of function     Time  6    Period  Weeks            Plan - 08/15/18 7681    Clinical Impression Statement  Mrs Marovich continues to report pain in the L shoulder and is regressing with AROM and strength compared to inital measures. She unfortunatley is not making any progress toward goals, and notes limited carryover of pain relief with treatment. Based on limited functional  progression and continued pain plan to discharge from PT and refer back to MD for further assessment.     PT Treatment/Interventions  ADLs/Self Care Home Management;Cryotherapy;Electrical Stimulation;Iontophoresis 40m/ml Dexamethasone;Moist Heat;Ultrasound;Therapeutic activities;Therapeutic exercise;Patient/family education;Dry needling;Passive range of motion;Taping;Manual techniques    PT Next Visit Plan  D/C    PT Home Exercise Plan  upper trap stretch, lower trap strengthening, ceiling punches, scapular retraction with ER, rows with TB, lower trap with red TB, 3 way bicep stretch    Consulted and Agree with Plan of Care  Patient       Patient will benefit from skilled therapeutic intervention in order to improve the following deficits and impairments:  Pain, Impaired UE functional use, Increased fascial restricitons, Decreased strength, Decreased endurance, Decreased activity tolerance, Improper body mechanics, Postural dysfunction, Decreased range of motion  Visit Diagnosis: Muscle weakness (generalized)  Chronic left shoulder pain     Problem List Patient Active Problem List   Diagnosis Date Noted  . Arm fatigue 07/15/2017  . Thumb pain, left 05/02/2017  . Anxiety 08/13/2016  . Acne 08/01/2015  . Family history of malignant neoplasm of breast 01/04/2014  . Family history of malignant neoplasm of ovary 01/04/2014  . Vitamin D deficiency disease 07/04/2011  . Osteopenia   . GERD (gastroesophageal reflux disease)     LStarr Lake3/13/2020, 8:42 AM  [facility] [place] [address]Phone: [phone]  Fax:  [phone] Name: LClariece RoeslerMRN: [id number] of Birth: [birth year]      PHYSICAL THERAPY DISCHARGE SUMMARY  Visits from Start of Care: 8   Current functional level related to goals / functional outcomes: See goals, FOTO 38% limited   Remaining deficits: Limited L  shoulder AROM, strength and continued pain. See assessment above.    Education / Equipment: HEP, theraband, posture  Plan: Patient agrees to discharge.  Patient goals were partially met. Patient is being discharged due to lack of progress.  ?????     Ismerai Bin PT, DPT, LAT, ATC  08/15/18  8:43 AM

## 2018-08-15 NOTE — Telephone Encounter (Signed)
Pt referred to Dr. Fredonia Highland for her left shoulder.  Appt: 08/25/2018 @ 9:30 am. Pt is aware.

## 2018-08-25 DIAGNOSIS — M25512 Pain in left shoulder: Secondary | ICD-10-CM | POA: Diagnosis not present

## 2018-08-25 MED FILL — ESTRADIOL 0.05 MG/24HR PTTW: 0.05 | 84 days supply | Qty: 24 | Fill #2

## 2018-09-10 ENCOUNTER — Encounter: Payer: 59 | Admitting: Internal Medicine

## 2018-09-10 DIAGNOSIS — M25512 Pain in left shoulder: Secondary | ICD-10-CM | POA: Diagnosis not present

## 2018-09-24 ENCOUNTER — Encounter: Payer: 59 | Admitting: Internal Medicine

## 2018-10-08 NOTE — H&P (Signed)
  [facility] [address] [place], [place] [address] [phone]  A Division of [facility]  RE: Katherine Vaughan, Katherine Vaughan                                  [id number]         DOB: 15-Feb-1961 09/10/2018  Reason for visit:  Follow up left shoulder pain.    HPI: She is a pleasant 57 year old woman.  She has an MRI that demonstrates some contrast in the subacromial space, as well as possible SLAP tear.  She has had significant tenderness over her biceps tendon.  Today we are doing a biceps tendon sheath injection.  We set her up for arthroscopic extensive debridement, biceps tenodesis and possible rotator cuff repair.  This was delayed due to the coronavirus.    OBJECTIVE: The patient is a well appearing female, in no apparent distress.  Persistent tenderness over her biceps tendon.  Good range of motion of her shoulder.    IMAGES: None today.   ASSESSMENT/PLAN:  Biceps tendinosis proximally.  We are going to do an intraarticular and biceps tendon sheath injection with 40 mg of Depo Medrol.  Continue to push range of motion.  Follow up TBD given surgical limitations dt COVID-19.Marland Kitchen  PROCEDURE NOTE: The patient's clinical condition is marked by substantial pain and/or significant functional disability. Other conservative therapy has not provided relief, is contraindicated, or not appropriate. There is a reasonable likelihood that injection will significantly improve the patient's pain and/or functional disability.   Patient is seated on the exam table.  The posterior left shoulder is prepped with Betadine and alcohol and injected into the subacromial interval with 40 mg of Depo-Medrol and 1 cc of Marcaine.  Patient tolerates the procedure without difficulty.     Ernesta Amble.  Percell Miller, M.D.  Electronically verified by Ernesta Amble. Percell Miller, M.D. TDM:pmw Cc:  Billey Gosling MD  fax [phone]  D 09/10/18 T 09/17/18

## 2018-10-09 ENCOUNTER — Other Ambulatory Visit: Payer: Self-pay

## 2018-10-09 ENCOUNTER — Encounter (HOSPITAL_BASED_OUTPATIENT_CLINIC_OR_DEPARTMENT_OTHER): Payer: Self-pay

## 2018-10-09 MED FILL — ESOMEPRAZOLE MAG DR 40 MG C: 40 | 30 days supply | Qty: 30 | Fill #0

## 2018-10-09 NOTE — Progress Notes (Signed)
Spoke with: Lattie Haw NPO:  No food after midnight/Clear liquids until 4:30AM DOS/DRINK Arrival time: 0700AM Labs: COVID AM medications: Nexium Pre op orders: Yes Ride home:Rick (husband) [phone]

## 2018-10-09 NOTE — Progress Notes (Signed)
SPOKE W/  Lattie Haw     SCREENING SYMPTOMS OF COVID 19:   COUGH--NO  RUNNY NOSE--- NO  SORE THROAT---NO  NASAL CONGESTION----NO  SNEEZING----NO  SHORTNESS OF BREATH---NO  DIFFICULTY BREATHING---NO  TEMP >100.0 -----NO  UNEXPLAINED BODY ACHES------NO  CHILLS --------NO   HEADACHES ---------NO  LOSS OF SMELL/ TASTE --------NO    HAVE YOU OR ANY FAMILY MEMBER TRAVELLED PAST 14 DAYS OUT OF THE   COUNTY---lives in [facility] STATE----NO COUNTRY----NO  HAVE YOU OR ANY FAMILY MEMBER BEEN EXPOSED TO ANYONE WITH COVID 19? NO

## 2018-10-10 ENCOUNTER — Other Ambulatory Visit (HOSPITAL_COMMUNITY)
Admission: RE | Admit: 2018-10-10 | Discharge: 2018-10-10 | Disposition: A | Discharge status: Home / Self Care | Payer: 59 | Source: Ambulatory Visit | Attending: Orthopedic Surgery | Admitting: Orthopedic Surgery

## 2018-10-10 DIAGNOSIS — Z1159 Encounter for screening for other viral diseases: Secondary | ICD-10-CM | POA: Insufficient documentation

## 2018-10-11 LAB — NOVEL CORONAVIRUS, NAA (HOSP ORDER, SEND-OUT TO REF LAB; TAT 18-24 HRS): SARS-CoV-2, NAA: NOT DETECTED

## 2018-10-13 MED FILL — AMPICILLIN TR 500 MG CAP: 500 | 90 days supply | Qty: 90 | Fill #0

## 2018-10-13 NOTE — Anesthesia Preprocedure Evaluation (Addendum)
Anesthesia Evaluation  Patient identified by MRN, date of birth, ID band Patient awake    Reviewed: Allergy & Precautions, NPO status , Patient's Chart, lab work & pertinent test results  History of Anesthesia Complications Negative for: history of anesthetic complications  Airway Mallampati: II  TM Distance: >3 FB Neck ROM: Full    Dental  (+) Teeth Intact, Dental Advisory Given   Pulmonary neg pulmonary ROS,    Pulmonary exam normal breath sounds clear to auscultation       Cardiovascular negative cardio ROS Normal cardiovascular exam Rhythm:Regular Rate:Normal     Neuro/Psych  Headaches, Anxiety    GI/Hepatic Neg liver ROS, hiatal hernia, GERD  Medicated and Controlled,  Endo/Other  negative endocrine ROS  Renal/GU negative Renal ROS     Musculoskeletal  (+) Arthritis ,   Abdominal   Peds  Hematology negative hematology ROS (+)   Anesthesia Other Findings Day of surgery medications reviewed with the patient.  Reproductive/Obstetrics                            Anesthesia Physical Anesthesia Plan  ASA: II  Anesthesia Plan: General   Post-op Pain Management: GA combined w/ Regional for post-op pain   Induction: Intravenous and Rapid sequence  PONV Risk Score and Plan: 3 and Treatment may vary due to age or medical condition, Midazolam, Ondansetron, Dexamethasone and Scopolamine patch - Pre-op  Airway Management Planned: Oral ETT  Additional Equipment:   Intra-op Plan:   Post-operative Plan: Extubation in OR  Informed Consent: I have reviewed the patients History and Physical, chart, labs and discussed the procedure including the risks, benefits and alternatives for the proposed anesthesia with the patient or authorized representative who has indicated his/her understanding and acceptance.     Dental advisory given  Plan Discussed with: CRNA  Anesthesia Plan Comments:         Anesthesia Quick Evaluation

## 2018-10-14 ENCOUNTER — Ambulatory Visit (HOSPITAL_BASED_OUTPATIENT_CLINIC_OR_DEPARTMENT_OTHER): Payer: 59 | Admitting: Anesthesiology

## 2018-10-14 ENCOUNTER — Encounter (HOSPITAL_BASED_OUTPATIENT_CLINIC_OR_DEPARTMENT_OTHER)
Admission: RE | Disposition: A | Discharge status: Home / Self Care | Payer: Self-pay | Source: Home / Self Care | Attending: Orthopedic Surgery

## 2018-10-14 ENCOUNTER — Ambulatory Visit (HOSPITAL_BASED_OUTPATIENT_CLINIC_OR_DEPARTMENT_OTHER)
Admission: RE | Admit: 2018-10-14 | Discharge: 2018-10-14 | Disposition: A | Discharge status: Home / Self Care | Payer: 59 | Attending: Orthopedic Surgery | Admitting: Orthopedic Surgery

## 2018-10-14 ENCOUNTER — Encounter (HOSPITAL_BASED_OUTPATIENT_CLINIC_OR_DEPARTMENT_OTHER): Payer: Self-pay | Admitting: Anesthesiology

## 2018-10-14 ENCOUNTER — Other Ambulatory Visit: Payer: Self-pay

## 2018-10-14 DIAGNOSIS — R51 Headache: Secondary | ICD-10-CM | POA: Insufficient documentation

## 2018-10-14 DIAGNOSIS — K449 Diaphragmatic hernia without obstruction or gangrene: Secondary | ICD-10-CM | POA: Diagnosis not present

## 2018-10-14 DIAGNOSIS — M1990 Unspecified osteoarthritis, unspecified site: Secondary | ICD-10-CM | POA: Diagnosis not present

## 2018-10-14 DIAGNOSIS — F419 Anxiety disorder, unspecified: Secondary | ICD-10-CM | POA: Insufficient documentation

## 2018-10-14 DIAGNOSIS — S43432A Superior glenoid labrum lesion of left shoulder, initial encounter: Secondary | ICD-10-CM

## 2018-10-14 DIAGNOSIS — K219 Gastro-esophageal reflux disease without esophagitis: Secondary | ICD-10-CM | POA: Insufficient documentation

## 2018-10-14 DIAGNOSIS — M7522 Bicipital tendinitis, left shoulder: Secondary | ICD-10-CM | POA: Diagnosis not present

## 2018-10-14 DIAGNOSIS — M75102 Unspecified rotator cuff tear or rupture of left shoulder, not specified as traumatic: Secondary | ICD-10-CM | POA: Insufficient documentation

## 2018-10-14 DIAGNOSIS — G8918 Other acute postprocedural pain: Secondary | ICD-10-CM | POA: Diagnosis not present

## 2018-10-14 DIAGNOSIS — M7502 Adhesive capsulitis of left shoulder: Secondary | ICD-10-CM | POA: Diagnosis not present

## 2018-10-14 DIAGNOSIS — M24112 Other articular cartilage disorders, left shoulder: Secondary | ICD-10-CM | POA: Diagnosis not present

## 2018-10-14 HISTORY — PX: SHOULDER ARTHROSCOPY WITH BICEPS TENDON REPAIR: SHX5674

## 2018-10-14 HISTORY — DX: Hypotension, unspecified: I95.9

## 2018-10-14 HISTORY — DX: Unspecified malignant neoplasm of skin, unspecified: C44.90

## 2018-10-14 SURGERY — SHOULDER ARTHROSCOPY WITH BICEPS TENDON REPAIR
Anesthesia: General | Site: Shoulder | Laterality: Left

## 2018-10-14 MED ORDER — FENTANYL CITRATE (PF) 100 MCG/2ML IJ SOLN
50.0000 ug | Freq: Once | INTRAMUSCULAR | Status: AC
  Administered 2018-10-14: 50 ug via INTRAVENOUS
  Filled 2018-10-14: qty 1

## 2018-10-14 MED ORDER — FENTANYL CITRATE (PF) 100 MCG/2ML IJ SOLN
INTRAMUSCULAR | Status: DC | PRN
  Administered 2018-10-14: 50 ug via INTRAVENOUS

## 2018-10-14 MED ORDER — CHLORHEXIDINE GLUCONATE 4 % EX LIQD
60.0000 mL | Freq: Once | CUTANEOUS | Status: DC
  Filled 2018-10-14: qty 118

## 2018-10-14 MED ORDER — MIDAZOLAM HCL 2 MG/2ML IJ SOLN
INTRAMUSCULAR | Status: AC
  Filled 2018-10-14: qty 2

## 2018-10-14 MED ORDER — METHYLPREDNISOLONE ACETATE 80 MG/ML IJ SUSP
80.0000 mg | Freq: Once | INTRAMUSCULAR | Status: DC
  Filled 2018-10-14 (×2): qty 1

## 2018-10-14 MED ORDER — SODIUM CHLORIDE 0.9 % IR SOLN
Status: DC | PRN
  Administered 2018-10-14: 3000 mL

## 2018-10-14 MED ORDER — KETOROLAC TROMETHAMINE 30 MG/ML IJ SOLN
INTRAMUSCULAR | Status: DC | PRN
  Administered 2018-10-14: 30 mg via INTRAVENOUS

## 2018-10-14 MED ORDER — ACETAMINOPHEN 500 MG PO TABS
ORAL_TABLET | ORAL | Status: AC
  Filled 2018-10-14: qty 2

## 2018-10-14 MED ORDER — PROMETHAZINE HCL 25 MG/ML IJ SOLN
6.2500 mg | INTRAMUSCULAR | Status: DC | PRN
  Filled 2018-10-14: qty 1

## 2018-10-14 MED ORDER — METHYLPREDNISOLONE ACETATE 80 MG/ML IJ SUSP
INTRAMUSCULAR | Status: DC | PRN
  Administered 2018-10-14: 80 mg via INTRA_ARTICULAR

## 2018-10-14 MED ORDER — ONDANSETRON HCL 4 MG/2ML IJ SOLN
INTRAMUSCULAR | Status: DC | PRN
  Administered 2018-10-14: 4 mg via INTRAVENOUS

## 2018-10-14 MED ORDER — BUPIVACAINE LIPOSOME 1.3 % IJ SUSP
INTRAMUSCULAR | Status: DC | PRN
  Administered 2018-10-14: 10 mL via PERINEURAL

## 2018-10-14 MED ORDER — SUGAMMADEX SODIUM 500 MG/5ML IV SOLN
INTRAVENOUS | Status: AC
  Filled 2018-10-14: qty 5

## 2018-10-14 MED ORDER — ONDANSETRON HCL 4 MG/2ML IJ SOLN
INTRAMUSCULAR | Status: AC
  Filled 2018-10-14: qty 2

## 2018-10-14 MED ORDER — ROCURONIUM BROMIDE 10 MG/ML (PF) SYRINGE: PREFILLED_SYRINGE | INTRAVENOUS | Status: DC | PRN

## 2018-10-14 MED ORDER — LIDOCAINE 2% (20 MG/ML) 5 ML SYRINGE
INTRAMUSCULAR | Status: AC
  Filled 2018-10-14: qty 10

## 2018-10-14 MED ORDER — FENTANYL CITRATE (PF) 100 MCG/2ML IJ SOLN
INTRAMUSCULAR | Status: AC
  Filled 2018-10-14: qty 2

## 2018-10-14 MED ORDER — GABAPENTIN 100 MG PO CAPS: 100.0000 mg | ORAL_CAPSULE | Freq: Two times a day (BID) | ORAL | 0 refills | Status: DC

## 2018-10-14 MED ORDER — BUPIVACAINE-EPINEPHRINE (PF) 0.25% -1:200000 IJ SOLN
INTRAMUSCULAR | Status: DC | PRN
  Administered 2018-10-14: 10 mL via PERINEURAL

## 2018-10-14 MED ORDER — CEFAZOLIN SODIUM-DEXTROSE 2-4 GM/100ML-% IV SOLN
INTRAVENOUS | Status: AC
  Filled 2018-10-14: qty 100

## 2018-10-14 MED ORDER — PROPOFOL 10 MG/ML IV BOLUS
INTRAVENOUS | Status: DC | PRN
  Administered 2018-10-14: 100 mg via INTRAVENOUS

## 2018-10-14 MED ORDER — PHENYLEPHRINE 40 MCG/ML (10ML) SYRINGE FOR IV PUSH (FOR BLOOD PRESSURE SUPPORT)
PREFILLED_SYRINGE | INTRAVENOUS | Status: AC
  Filled 2018-10-14: qty 10

## 2018-10-14 MED ORDER — METHOCARBAMOL 500 MG PO TABS: 500.0000 mg | ORAL_TABLET | Freq: Three times a day (TID) | ORAL | 0 refills | Status: DC | PRN

## 2018-10-14 MED ORDER — HYDROCODONE-ACETAMINOPHEN 5-325 MG PO TABS: 1.0000 | ORAL_TABLET | Freq: Four times a day (QID) | ORAL | 0 refills | Status: DC | PRN

## 2018-10-14 MED ORDER — SODIUM CHLORIDE 0.9 % IV SOLN
INTRAVENOUS | Status: DC | PRN
  Administered 2018-10-14 (×2): 50 ug/min via INTRAVENOUS

## 2018-10-14 MED ORDER — DEXAMETHASONE SODIUM PHOSPHATE 10 MG/ML IJ SOLN
INTRAMUSCULAR | Status: AC
  Filled 2018-10-14: qty 1

## 2018-10-14 MED ORDER — ROCURONIUM BROMIDE 10 MG/ML (PF) SYRINGE
PREFILLED_SYRINGE | INTRAVENOUS | Status: AC
  Filled 2018-10-14: qty 10

## 2018-10-14 MED ORDER — MIDAZOLAM HCL 2 MG/2ML IJ SOLN
1.0000 mg | Freq: Once | INTRAMUSCULAR | Status: AC
  Administered 2018-10-14: 1 mg via INTRAVENOUS
  Filled 2018-10-14: qty 1

## 2018-10-14 MED ORDER — SUGAMMADEX SODIUM 200 MG/2ML IV SOLN
INTRAVENOUS | Status: DC | PRN
  Administered 2018-10-14: 110 mg via INTRAVENOUS

## 2018-10-14 MED ORDER — PROPOFOL 10 MG/ML IV BOLUS
INTRAVENOUS | Status: AC
  Filled 2018-10-14: qty 20

## 2018-10-14 MED ORDER — ONDANSETRON HCL 4 MG PO TABS: 4.0000 mg | ORAL_TABLET | Freq: Three times a day (TID) | ORAL | 0 refills | Status: DC | PRN

## 2018-10-14 MED ORDER — LIDOCAINE 2% (20 MG/ML) 5 ML SYRINGE
INTRAMUSCULAR | Status: DC | PRN
  Administered 2018-10-14: 50 mg via INTRAVENOUS

## 2018-10-14 MED ORDER — ROCURONIUM BROMIDE 50 MG/5ML IV SOSY
PREFILLED_SYRINGE | INTRAVENOUS | Status: DC | PRN
  Administered 2018-10-14: 60 mg via INTRAVENOUS

## 2018-10-14 MED ORDER — BUPIVACAINE HCL (PF) 0.25 % IJ SOLN
INTRAMUSCULAR | Status: DC | PRN
  Administered 2018-10-14: 5 mL via INTRA_ARTICULAR

## 2018-10-14 MED ORDER — FENTANYL CITRATE (PF) 100 MCG/2ML IJ SOLN
25.0000 ug | INTRAMUSCULAR | Status: DC | PRN
  Filled 2018-10-14: qty 1

## 2018-10-14 MED ORDER — GABAPENTIN 300 MG PO CAPS
300.0000 mg | ORAL_CAPSULE | Freq: Once | ORAL | Status: AC
  Administered 2018-10-14: 300 mg via ORAL
  Filled 2018-10-14: qty 1

## 2018-10-14 MED ORDER — CEFAZOLIN SODIUM-DEXTROSE 2-4 GM/100ML-% IV SOLN
2.0000 g | INTRAVENOUS | Status: AC
  Administered 2018-10-14: 2 g via INTRAVENOUS
  Filled 2018-10-14: qty 100

## 2018-10-14 MED ORDER — DEXAMETHASONE SODIUM PHOSPHATE 10 MG/ML IJ SOLN
INTRAMUSCULAR | Status: DC | PRN
  Administered 2018-10-14: 10 mg via INTRAVENOUS

## 2018-10-14 MED ORDER — ONDANSETRON HCL 4 MG/2ML IJ SOLN
4.0000 mg | Freq: Once | INTRAMUSCULAR | Status: AC
  Administered 2018-10-14: 4 mg via INTRAVENOUS
  Filled 2018-10-14: qty 2

## 2018-10-14 MED ORDER — KETOROLAC TROMETHAMINE 30 MG/ML IJ SOLN
INTRAMUSCULAR | Status: AC
  Filled 2018-10-14: qty 1

## 2018-10-14 MED ORDER — LACTATED RINGERS IV SOLN
INTRAVENOUS | Status: DC
  Administered 2018-10-14: 08:00:00 via INTRAVENOUS
  Filled 2018-10-14: qty 1000

## 2018-10-14 MED ORDER — PHENYLEPHRINE HCL (PRESSORS) 10 MG/ML IV SOLN
INTRAVENOUS | Status: AC
  Filled 2018-10-14: qty 1

## 2018-10-14 MED ORDER — GABAPENTIN 300 MG PO CAPS
ORAL_CAPSULE | ORAL | Status: AC
  Filled 2018-10-14: qty 1

## 2018-10-14 MED ORDER — PHENYLEPHRINE 40 MCG/ML (10ML) SYRINGE FOR IV PUSH (FOR BLOOD PRESSURE SUPPORT)
PREFILLED_SYRINGE | INTRAVENOUS | Status: DC | PRN
  Administered 2018-10-14: 80 ug via INTRAVENOUS
  Administered 2018-10-14: 120 ug via INTRAVENOUS

## 2018-10-14 MED ORDER — ACETAMINOPHEN 500 MG PO TABS
1000.0000 mg | ORAL_TABLET | Freq: Once | ORAL | Status: AC
  Administered 2018-10-14: 1000 mg via ORAL
  Filled 2018-10-14: qty 2

## 2018-10-14 MED FILL — GABAPENTIN 100 MG CAPSULE: 100 | 10 days supply | Qty: 20 | Fill #0

## 2018-10-14 MED FILL — HYDROCODON-APAP 5-325: 5-325 | 4 days supply | Qty: 30 | Fill #0

## 2018-10-14 MED FILL — METHOCARBAMOL 500 MG TABLET: 500 | 7 days supply | Qty: 20 | Fill #0

## 2018-10-14 MED FILL — ONDANSETRON HCL 4 MG TABLET: 4 | 7 days supply | Qty: 20 | Fill #0

## 2018-10-14 SURGICAL SUPPLY — 71 items
ANCHOR SUT BIOCOMP LK 2.9X12.5 (Anchor) ×2 IMPLANT
BLADE CUTTER GATOR 3.5 (BLADE) IMPLANT
BLADE CUTTER MENIS 5.5 (BLADE) IMPLANT
BLADE GREAT WHITE 4.2 (BLADE) IMPLANT
BLADE SURG 15 STRL LF DISP TIS (BLADE) IMPLANT
BLADE SURG 15 STRL SS (BLADE)
BUR OVAL 4.0 (BURR) IMPLANT
BUR OVAL 6.0 (BURR) IMPLANT
CANNULA 5.75X71 LONG (CANNULA) ×2 IMPLANT
CANNULA TWIST IN 8.25X7CM (CANNULA) IMPLANT
CHLORAPREP W/TINT 26ML (MISCELLANEOUS) ×2 IMPLANT
CLSR STERI-STRIP ANTIMIC 1/2X4 (GAUZE/BANDAGES/DRESSINGS) IMPLANT
COVER WAND RF STERILE (DRAPES) ×2 IMPLANT
DISSECTOR 4.0MM X 13CM (MISCELLANEOUS) ×2 IMPLANT
DRAPE IMP U-DRAPE 54X76 (DRAPES) ×2 IMPLANT
DRAPE INCISE IOBAN 66X45 STRL (DRAPES) ×2 IMPLANT
DRAPE SHOULDER BEACH CHAIR (DRAPES) ×2 IMPLANT
DRAPE U-SHAPE 47X51 STRL (DRAPES) ×2 IMPLANT
DRSG EMULSION OIL 3X3 NADH (GAUZE/BANDAGES/DRESSINGS) ×2 IMPLANT
DRSG PAD ABDOMINAL 8X10 ST (GAUZE/BANDAGES/DRESSINGS) ×2 IMPLANT
ELECT REM PT RETURN 9FT ADLT (ELECTROSURGICAL) ×2
ELECTRODE REM PT RTRN 9FT ADLT (ELECTROSURGICAL) ×1 IMPLANT
GAUZE SPONGE 4X4 12PLY STRL (GAUZE/BANDAGES/DRESSINGS) ×4 IMPLANT
GLOVE BIO SURGEON STRL SZ7.5 (GLOVE) ×4 IMPLANT
GLOVE BIOGEL PI IND STRL 8 (GLOVE) ×2 IMPLANT
GLOVE BIOGEL PI INDICATOR 8 (GLOVE) ×2
GOWN STRL REUS W/ TWL LRG LVL3 (GOWN DISPOSABLE) ×3 IMPLANT
GOWN STRL REUS W/ TWL XL LVL3 (GOWN DISPOSABLE) IMPLANT
GOWN STRL REUS W/TWL LRG LVL3 (GOWN DISPOSABLE) ×3
GOWN STRL REUS W/TWL XL LVL3 (GOWN DISPOSABLE)
KIT PUSHLOCK 2.9 HIP (KITS) IMPLANT
MANIFOLD NEPTUNE II (INSTRUMENTS) ×2 IMPLANT
NDL SUT 6 .5 CRC .975X.05 MAYO (NEEDLE) IMPLANT
NEEDLE MAYO TAPER (NEEDLE)
NEEDLE SCORPION MULTI FIRE (NEEDLE) IMPLANT
NS IRRIG 1000ML POUR BTL (IV SOLUTION) IMPLANT
PACK ARTHROSCOPY DSU (CUSTOM PROCEDURE TRAY) ×2 IMPLANT
PACK BASIN DAY SURGERY FS (CUSTOM PROCEDURE TRAY) ×2 IMPLANT
PASSER SUT SWIFTSTITCH HIP CRT (INSTRUMENTS) ×2 IMPLANT
PENCIL BUTTON HOLSTER BLD 10FT (ELECTRODE) IMPLANT
PORT APPOLLO RF 90DEGREE MULTI (SURGICAL WAND) ×2 IMPLANT
PROBE BIPOLAR ATHRO 135MM 90D (MISCELLANEOUS) ×2 IMPLANT
RESTRAINT HEAD UNIVERSAL NS (MISCELLANEOUS) ×2 IMPLANT
SLEEVE SCD COMPRESS KNEE MED (MISCELLANEOUS) ×2 IMPLANT
SLING ARM FOAM STRAP LRG (SOFTGOODS) ×2 IMPLANT
SLING ARM IMMOBILIZER LRG (SOFTGOODS) IMPLANT
SLING ARM IMMOBILIZER MED (SOFTGOODS) IMPLANT
SLING ARM MED ADULT FOAM STRAP (SOFTGOODS) IMPLANT
SLING ARM XL FOAM STRAP (SOFTGOODS) IMPLANT
SUCTION FRAZIER HANDLE 10FR (MISCELLANEOUS)
SUCTION TUBE FRAZIER 10FR DISP (MISCELLANEOUS) IMPLANT
SUT ETHIBOND 2 OS 4 DA (SUTURE) IMPLANT
SUT ETHILON 2 0 FS 18 (SUTURE) IMPLANT
SUT ETHILON 3 0 PS 1 (SUTURE) ×2 IMPLANT
SUT FIBERWIRE #2 38 T-5 BLUE (SUTURE)
SUT MNCRL AB 4-0 PS2 18 (SUTURE) IMPLANT
SUT TIGER TAPE 7 IN WHITE (SUTURE) IMPLANT
SUT VIC AB 0 CT1 27 (SUTURE)
SUT VIC AB 0 CT1 27XBRD ANBCTR (SUTURE) IMPLANT
SUT VIC AB 2-0 SH 27 (SUTURE)
SUT VIC AB 2-0 SH 27XBRD (SUTURE) IMPLANT
SUT VIC AB 3-0 FS2 27 (SUTURE) IMPLANT
SUTURE FIBERWR #2 38 T-5 BLUE (SUTURE) IMPLANT
SUTURE TAPE TIGERLINK 1.3MM BL (SUTURE) IMPLANT
SUTURETAPE TIGERLINK 1.3MM BL (SUTURE)
SWIFTSWITCH SITURE PASSER ×2 IMPLANT
TAPE FIBER 2MM 7IN #2 BLUE (SUTURE) IMPLANT
TOWEL OR 17X26 10 PK STRL BLUE (TOWEL DISPOSABLE) ×2 IMPLANT
TUBING ARTHRO INFLOW-ONLY STRL (TUBING) ×2 IMPLANT
WATER STERILE IRR 1000ML POUR (IV SOLUTION) ×2 IMPLANT
YANKAUER SUCT BULB TIP NO VENT (SUCTIONS) IMPLANT

## 2018-10-14 NOTE — Anesthesia Procedure Notes (Signed)
Procedure Name: Intubation Date/Time: 10/14/2018 9:22 AM Performed by: Bonney Aid, CRNA Pre-anesthesia Checklist: Patient identified, Emergency Drugs available, Suction available and Patient being monitored Patient Re-evaluated:Patient Re-evaluated prior to induction Oxygen Delivery Method: Circle system utilized Preoxygenation: Pre-oxygenation with 100% oxygen Induction Type: IV induction and Rapid sequence Ventilation: Mask ventilation without difficulty Laryngoscope Size: Mac and 3 Grade View: Grade I Tube type: Oral Tube size: 7.0 mm Number of attempts: 1 Airway Equipment and Method: Stylet and Oral airway Placement Confirmation: ETT inserted through vocal cords under direct vision,  positive ETCO2 and breath sounds checked- equal and bilateral Secured at: 21 cm Tube secured with: Tape Dental Injury: Teeth and Oropharynx as per pre-operative assessment

## 2018-10-14 NOTE — Anesthesia Procedure Notes (Signed)
Anesthesia Regional Block: Interscalene brachial plexus block   Pre-Anesthetic Checklist: ,, timeout performed, Correct Patient, Correct Site, Correct Laterality, Correct Procedure, Correct Position, site marked, Risks and benefits discussed, pre-op evaluation,  At surgeon's request and post-op pain management  Laterality: Left  Prep: Maximum Sterile Barrier Precautions used, chloraprep       Needles:  Injection technique: Single-shot  Needle Type: Echogenic Stimulator Needle     Needle Length: 9cm  Needle Gauge: 22     Additional Needles:   Procedures:,,,, ultrasound used (permanent image in chart),,,,  Narrative:  Start time: 10/14/2018 7:57 AM End time: 10/14/2018 8:01 AM Injection made incrementally with aspirations every 5 mL.  Performed by: Personally  Anesthesiologist: Brennan Bailey, MD  Additional Notes: Risks, benefits, and alternative discussed. Patient gave consent for procedure. Patient prepped and draped in sterile fashion. Sedation administered, patient remains easily responsive to voice. Relevant anatomy identified with ultrasound guidance. Posterior in-plane approach due to overlying vessels. Local anesthetic given in 5cc increments with no signs or symptoms of intravascular injection. No pain or paraesthesias with injection. Patient monitored throughout procedure with signs of LAST or immediate complications. Tolerated well. Ultrasound image placed in chart.  Tawny Asal, MD

## 2018-10-14 NOTE — Discharge Instructions (Signed)
Maintain sling until follow up.  Diet: As you were doing prior to hospitalization   Shower:  May shower but keep the wounds dry, use an occlusive plastic wrap, NO SOAKING IN TUB.  If the bandage gets wet, change with a clean dry gauze.    Dressing:  Keep dressings on and dry.  You may remove dressings in 3 days and shower over incisions.  No Bath / submerging incisions.  Cover with clean Band-Aid.  Activity:  Increase activity slowly as tolerated, but follow the weight bearing instructions below.  The rules on driving is that you can not be taking narcotics while you drive, and you must feel in control of the vehicle.    Weight Bearing:  Do not bear weight with affected arm.   To prevent constipation: you may use a stool softener such as -  Colace (over the counter) 100 mg by mouth twice a day  Drink plenty of fluids (prune juice may be helpful) and high fiber foods Miralax (over the counter) for constipation as needed.    Itching:  If you experience itching with your medications, try taking only a single pain pill, or even half a pain pill at a time.  You can also use benadryl over the counter for itching or also to help with sleep.   Precautions:  If you experience chest pain or shortness of breath - call 911 immediately for transfer to the hospital emergency department!!  If you develop a fever greater that 101 F, purulent drainage from wound, increased redness or drainage from wound, or calf pain -- Call the office at [phone]                                                 Follow- Up Appointment:  Please call for an appointment to be seen in 2 weeks [place] - [phone]   Regional Anesthesia Blocks  1. Numbness or the inability to move the "blocked" extremity may last from 3-48 hours after placement. The length of time depends on the medication injected and your individual response to the medication. If the numbness is not going away after 48 hours, call your  surgeon.  2. The extremity that is blocked will need to be protected until the numbness is gone and the Strength has returned. Because you cannot feel it, you will need to take extra care to avoid injury. Because it may be weak, you may have difficulty moving it or using it. You may not know what position it is in without looking at it while the block is in effect.  3. For blocks in the legs and feet, returning to weight bearing and walking needs to be done carefully. You will need to wait until the numbness is entirely gone and the strength has returned. You should be able to move your leg and foot normally before you try and bear weight or walk. You will need someone to be with you when you first try to ensure you do not fall and possibly risk injury.  4. Bruising and tenderness at the needle site are common side effects and will resolve in a few days.  5. Persistent numbness or new problems with movement should be communicated to the surgeon or the [facility] [phone]).  Information for Discharge Teaching: EXPAREL (bupivacaine liposome injectable suspension)   Your  surgeon or anesthesiologist gave you EXPAREL(bupivacaine) to help control your pain after surgery.   EXPAREL is a local anesthetic that provides pain relief by numbing the tissue around the surgical site.  EXPAREL is designed to release pain medication over time and can control pain for up to 72 hours.  Depending on how you respond to EXPAREL, you may require less pain medication during your recovery.  Possible side effects:  Temporary loss of sensation or ability to move in the area where bupivacaine was injected.  Nausea, vomiting, constipation  Rarely, numbness and tingling in your mouth or lips, lightheadedness, or anxiety may occur.  Call your doctor right away if you think you may be experiencing any of these sensations, or if you have other questions regarding possible side effects.  Follow all  other discharge instructions given to you by your surgeon or nurse. Eat a healthy diet and drink plenty of water or other fluids.  If you return to the hospital for any reason within 96 hours following the administration of EXPAREL, it is important for health care providers to know that you have received this anesthetic. A teal colored band has been placed on your arm with the date, time and amount of EXPAREL you have received in order to alert and inform your health care providers. Please leave this armband in place for the full 96 hours following administration, and then you may remove the band. May remove green armband on Saturday, Oct 18, 2018.  Post Anesthesia Home Care Instructions  Activity: Get plenty of rest for the remainder of the day. A responsible individual must stay with you for 24 hours following the procedure.  For the next 24 hours, DO NOT: -Drive a car -Paediatric nurse -Drink alcoholic beverages -Take any medication unless instructed by your physician -Make any legal decisions or sign important papers.  Meals: Start with liquid foods such as gelatin or soup. Progress to regular foods as tolerated. Avoid greasy, spicy, heavy foods. If nausea and/or vomiting occur, drink only clear liquids until the nausea and/or vomiting subsides. Call your physician if vomiting continues.  Special Instructions/Symptoms: Your throat may feel dry or sore from the anesthesia or the breathing tube placed in your throat during surgery. If this causes discomfort, gargle with warm salt water. The discomfort should disappear within 24 hours.

## 2018-10-14 NOTE — Anesthesia Postprocedure Evaluation (Signed)
Anesthesia Post Note  Patient: SOLSTICE LASTINGER  Procedure(s) Performed: SHOULDER ARTHROSCOPY BICEP TENODESIS, SHOULDER DECOMPRESSION  AND SHOULDER MANIPULATION (Left Shoulder)     Patient location during evaluation: PACU Anesthesia Type: General Level of consciousness: awake and alert Pain management: pain level controlled Vital Signs Assessment: post-procedure vital signs reviewed and stable Respiratory status: spontaneous breathing, nonlabored ventilation and respiratory function stable Cardiovascular status: blood pressure returned to baseline and stable Postop Assessment: no apparent nausea or vomiting Anesthetic complications: no    Last Vitals:  Vitals:   10/14/18 1145 10/14/18 1225  BP: 106/62 112/71  Pulse: 62 68  Resp: (!) 7 18  Temp:  36.6 C  SpO2: 95% 100%    Last Pain:  Vitals:   10/14/18 1143  TempSrc: Oral  PainSc:                  Brennan Bailey

## 2018-10-14 NOTE — Transfer of Care (Signed)
Immediate Anesthesia Transfer of Care Note  Patient: Katherine Vaughan  Procedure(s) Performed: SHOULDER ARTHROSCOPY BICEP TENODESIS  AND POSSIBLE ROTATOR CUFF REPAIR (Left )  Patient Location: PACU  Anesthesia Type:General  Level of Consciousness: drowsy  Airway & Oxygen Therapy: Patient Spontanous Breathing and Patient connected to nasal cannula oxygen  Post-op Assessment: Report given to RN  Post vital signs: Reviewed and stable  Last Vitals:  Vitals Value Taken Time  BP 109/67 10/14/2018 10:30 AM  Temp    Pulse 64 10/14/2018 10:31 AM  Resp    SpO2 100 % 10/14/2018 10:31 AM  Vitals shown include unvalidated device data.  Last Pain:  Vitals:   10/14/18 0700  TempSrc: Oral      Patients Stated Pain Goal: 4 (08/65/78 4696)  Complications: No apparent anesthesia complications

## 2018-10-14 NOTE — Progress Notes (Signed)
Assisted Dr. Howze with left, ultrasound guided, supraclavicular block. Side rails up, monitors on throughout procedure. See vital signs in flow sheet. Tolerated Procedure well. 

## 2018-10-14 NOTE — Interval H&P Note (Signed)
I participated in the care of this patient and agree with the above history, physical and evaluation. I performed a review of the history and a physical exam as detailed   Timothy Daniel Murphy MD  

## 2018-10-14 NOTE — Op Note (Signed)
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   ANESTHESIA:   General  PREOPERATIVE INDICATIONS:  [name] is a  58 y.o. female with a diagnosis of LEFT SHOULDER BICIPITAL TENDONITIS WITH ROTATOR CUFF TEAR who failed conservative measures and elected for surgical management.    The risks benefits and alternatives were discussed with the patient preoperatively including but not limited to the risks of infection, bleeding, nerve injury, cardiopulmonary complications, the need for revision surgery, among others, and the patient was willing to proceed.  OPERATIVE IMPLANTS: 2.9 pushlock anchor  OPERATIVE FINDINGS: stiffness Pre-op ROM FE 100  BLOOD LOSS: minimal  COMPLICATIONS: nonne  OPERATIVE PROCEDURE:  Patient was identified in the preoperative holding area and site was marked by me He was transported to the operating theater and placed on the table in beach chair position taking care to pad all bony prominences. After a preincinduction time out anesthesia was induced. The left upper extremity was prepped and draped in normal sterile fashion and a pre-incision timeout was performed. Katherine Vaughan received ancef for preoperative antibiotics.   I performed a firm but gentle manipulation under anesthesia and had palpable breakup of scar tissue and improvement in range of motion.  Initially made a posterior arthroscopic portal and inserted the arthroscope into the glenohumeral joint. tour of the joint demonstrated the above operative findings  I created an anterior portal  just lateral to the coracoid under direct visualization using a spinal needle.  I performed an extensive debridement of the scarred synovial tissue and remaining structures including superior labral tear  I performed a Loop and Tack biceps tenodesis using a 2.9 pushlock anchor  I then debrided the superior labrum  Cuff was intact  Next I removed all arthroscopic equipment expressed all fluid and closed the portals with a nylon stitch. A sterile dressing was applied the patient was taken the PACU in stable condition.  POST OPERATIVE PLAN: The patient will be in a sling full-time and keep the dressings clean dry and intact. DVT prophylaxis will consist of early ambulation

## 2018-10-14 NOTE — OR Nursing (Signed)
Pt was in phase 2 for an extensive time as she was waiting for her ride home.   Darin Engels rn

## 2018-10-16 ENCOUNTER — Encounter (HOSPITAL_BASED_OUTPATIENT_CLINIC_OR_DEPARTMENT_OTHER): Payer: Self-pay | Admitting: Orthopedic Surgery

## 2018-10-22 DIAGNOSIS — D2272 Melanocytic nevi of left lower limb, including hip: Secondary | ICD-10-CM | POA: Diagnosis not present

## 2018-10-22 DIAGNOSIS — D2262 Melanocytic nevi of left upper limb, including shoulder: Secondary | ICD-10-CM | POA: Diagnosis not present

## 2018-10-22 DIAGNOSIS — D2271 Melanocytic nevi of right lower limb, including hip: Secondary | ICD-10-CM | POA: Diagnosis not present

## 2018-10-22 DIAGNOSIS — D1801 Hemangioma of skin and subcutaneous tissue: Secondary | ICD-10-CM | POA: Diagnosis not present

## 2018-10-22 DIAGNOSIS — D225 Melanocytic nevi of trunk: Secondary | ICD-10-CM | POA: Diagnosis not present

## 2018-10-22 DIAGNOSIS — D2261 Melanocytic nevi of right upper limb, including shoulder: Secondary | ICD-10-CM | POA: Diagnosis not present

## 2018-10-24 DIAGNOSIS — M24112 Other articular cartilage disorders, left shoulder: Secondary | ICD-10-CM | POA: Diagnosis not present

## 2018-10-31 ENCOUNTER — Encounter: Payer: Self-pay | Admitting: Physical Therapy

## 2018-10-31 ENCOUNTER — Ambulatory Visit: Payer: 59 | Attending: Sports Medicine | Admitting: Physical Therapy

## 2018-10-31 ENCOUNTER — Other Ambulatory Visit: Payer: Self-pay

## 2018-10-31 DIAGNOSIS — M6281 Muscle weakness (generalized): Secondary | ICD-10-CM | POA: Diagnosis not present

## 2018-10-31 DIAGNOSIS — M25512 Pain in left shoulder: Secondary | ICD-10-CM | POA: Diagnosis not present

## 2018-10-31 DIAGNOSIS — M25612 Stiffness of left shoulder, not elsewhere classified: Secondary | ICD-10-CM | POA: Insufficient documentation

## 2018-10-31 NOTE — Therapy (Signed)
[place], [place], [address] Phone: [phone]   Fax:  [phone]  Physical Therapy Evaluation  Patient Details  Name: Katherine Vaughan MRN: [id number] Date of Birth: May 02, 1961 Referring Provider (PT): Dr. Edmonia Lynch   Encounter Date: 10/31/2018  PT End of Session - 10/31/18 1049    Visit Number  1    Number of Visits  16    PT Start Time  1010    PT Stop Time  1050    PT Time Calculation (min)  40 min    Activity Tolerance  Patient tolerated treatment well    Behavior During Therapy  [facility] for tasks assessed/performed       Past Medical History:  Diagnosis Date  . Arthritis   . GERD (gastroesophageal reflux disease)   . History of colon polyps 2006   adenomatous, no polyps in 2011  . History of hiatal hernia   . History of kidney stones   . Low blood pressure    occ.  . Migraines    decreased since menopause  . Osteopenia    dexa 08/2010: -2.3 L fem  . PONV (postoperative nausea and vomiting)    only with Demerol  . Skin cancer    Middle of chest  . Urine incontinence     Past Surgical History:  Procedure Laterality Date  . ABDOMINAL HYSTERECTOMY  2006  . BREAST EXCISIONAL BIOPSY Bilateral   . BREAST SURGERY     breast biopsy x's 2 (L) 92 and (R) 98  . COLONOSCOPY    . COLONOSCOPY WITH PROPOFOL N/A 02/11/2015   Procedure: COLONOSCOPY WITH PROPOFOL;  Surgeon: Clarene Essex, MD;  Location: [facility] ENDOSCOPY;  Service: Endoscopy;  Laterality: N/A;  . ESOPHAGOGASTRODUODENOSCOPY N/A 02/11/2015   Procedure: ESOPHAGOGASTRODUODENOSCOPY (EGD);  Surgeon: Clarene Essex, MD;  Location: [facility] ENDOSCOPY;  Service: Endoscopy;  Laterality: N/A;  . Laser endometriosis    . SEPTOPLASTY    . SHOULDER ARTHROSCOPY WITH BICEPS TENDON REPAIR Left 10/14/2018   Procedure: SHOULDER ARTHROSCOPY BICEP TENODESIS, SHOULDER DECOMPRESSION  AND SHOULDER MANIPULATION;  Surgeon: Renette Butters, MD;  Location: [facility];  Service: Orthopedics;  Laterality: Left;  REGIONAL BLOCK  . TUBAL LIGATION      There were no vitals filed for this visit.   Subjective Assessment - 10/31/18 1015    Subjective  Pt presents after surgery on 10/14/18 after failing conservative measures, including PT ending in March.  She wore the sling for a few days but then only at night.  She no longer wears it.  Since surgery she reports continued soreness and expected limitation in use of L UE.  She is optimistic for her potential outcome.     Pertinent History  SHOULDER ARTHROSCOPY BICEP TENODESIS, SHOULDER DECOMPRESSION  AND SHOULDER MANIPULATION , arthritis, osteopenia     Limitations  Lifting;House hold activities    Patient Stated Goals  to decrease pain, return to normal activity no issues or limitation    Currently in Pain?  No/denies    Pain Score  --   can be 4/10 with ADLs.    Pain Location  Shoulder    Pain Orientation  Left    Pain Descriptors / Indicators  Sharp;Sore    Pain Type  Surgical pain;Chronic pain    Pain Radiating Towards  deltoid     Pain Onset  More than a month ago    Pain Frequency  Intermittent    Aggravating  Factors   reaching    Pain Relieving Factors  rest    Effect of Pain on Daily Activities  limited mobility          [facility] PT Assessment - 10/31/18 0001      Assessment   Medical Diagnosis  L shoulder manipulation and biceps tenodesis     Referring Provider (PT)  Dr. Edmonia Lynch    Hand Dominance  Right    Next MD Visit  November 21, 2018     Prior Therapy  Yes       Precautions   Precautions  None    Precaution Comments  avoid overloading biceps       Restrictions   Weight Bearing Restrictions  No      Balance Screen   Has the patient fallen in the past 6 months  No      [place] residence    Living Arrangements  Spouse/significant other    Available Help at Discharge  Family    Type of [place] to enter     Entrance Stairs-Number of Steps  3    Entrance Stairs-Rails  None    Home Layout  One level      Prior Function   Vocation  Part time employment    Architect, not working       Cognition   Overall Cognitive Status  Within Functional Limits for tasks assessed      Sensation   Light Touch  Appears Intact      Coordination   Gross Motor Movements are Fluid and Coordinated  Not tested      Posture/Postural Control   Postural Limitations  Rounded Shoulders    Posture Comments  min guarding       AROM   Left Shoulder Extension  45 Degrees    Left Shoulder Flexion  125 Degrees    Left Shoulder ABduction  100 Degrees    Left Shoulder Internal Rotation  72 Degrees   FR to L side lumbar spine    Left Shoulder External Rotation  45 Degrees   FR to side of head     PROM   Left Shoulder Flexion  125 Degrees    Left Shoulder ABduction  100 Degrees    Left Shoulder Internal Rotation  80 Degrees    Left Shoulder External Rotation  50 Degrees      Strength   Left Shoulder Flexion  4-/5    Left Shoulder ABduction  3+/5    Left Shoulder Internal Rotation  3+/5   pain    Left Shoulder External Rotation  4/5      Palpation   Palpation comment  tender along biceps, anterior and middle deltoid, no visible edema                 Objective measurements completed on examination: See above findings.      [facility] PT Treatment/Exercise - 10/31/18 0001      Shoulder Exercises: Supine   Other Supine Exercises  supine cane exercises : chest press , shoulder flexionn, ER and abduction x 10       Shoulder Exercises: Isometric Strengthening   Flexion  5X5"    External Rotation  5X5"    Internal Rotation  5X5"    ABduction  5X5"  PT Education - 10/31/18 1617    Education Details  given supine cane for AAROM and isometrics via [facility] , POC, ICE vs heat     Person(s) Educated  Patient    Methods   Demonstration;Explanation;Tactile cues;Verbal cues;Handout    Comprehension  Verbalized understanding;Returned demonstration       PT Short Term Goals - 10/31/18 1617      PT SHORT TERM GOAL #1   Title  pt to be I with inital HEP     Time  4    Period  Weeks    Status  New    Target Date  11/28/18      PT SHORT TERM GOAL #2   Title  Pt will utilize heat/ice as needed for post session soreness, pain     Time  4    Period  Weeks    Status  New    Target Date  11/28/18      PT SHORT TERM GOAL #3   Title  Pt will begin to learn good body awareness and scapular position when performing HEP     Time  4    Period  Weeks    Status  New    Target Date  11/28/18        PT Long Term Goals - 10/31/18 1619      PT LONG TERM GOAL #1   Title  Pt will be able to reach to mid low back with L UE for ADLs, dressing and pain <2/10     Time  8    Period  Weeks    Status  New    Target Date  12/26/18      PT LONG TERM GOAL #2   Title  Pt will increase L shoulder strengtht to >/= 4+/5 in all planes to promote shoulder stability while lifting, carrying items.     Baseline  3+/5 to 4_/5 post surgical     Time  8    Period  Weeks    Status  New    Target Date  12/26/18      PT LONG TERM GOAL #3   Title  Pt will be able to lift arm to 120 deg or more with light weight (<5 lbs)  for functional use in kitchen and at work     Time  8    Period  Weeks    Status  New    Target Date  12/26/18      PT LONG TERM GOAL #4   Title  increase FOTO score to predicted level or less limited to demo improvement in function     Time  8    Period  Weeks    Status  New    Target Date  12/26/18      PT LONG TERM GOAL #5   Title  pt to be I with all HEP given as of last visit to maintain and progress current level of function     Time  8    Period  Weeks    Status  New    Target Date  12/26/18             Plan - 10/31/18 1627    Clinical Impression Statement  Patient presents for low  complexity eval of L shoulder following manipulation and biceps tenodesis on 10/14/18.Her pain is well controlled and her ROM is appropriate for time frame.  She was advised to stay with [facility]  and loading biceps with lifting.  She does not wear the sling and is not working nor in the community much due to COVID-19.  She did express anxiety and depression recently due to changes in function, new pain in Rt shoulder and being isolated from peers.  I encouraged her to take this time to work on her shoulder and reach out to her support system. I expect her to do very well after this procedure.     Personal Factors and Comorbidities  Past/Current Experience    Examination-Activity Limitations  Lift;Reach Overhead;Sleep    Examination-Participation Restrictions  Community Activity;Driving;Church    Stability/Clinical Decision Making  Stable/Uncomplicated    Clinical Decision Making  Low    Rehab Potential  Excellent    PT Frequency  2x / week    PT Duration  8 weeks    PT Treatment/Interventions  ADLs/Self Care Home Management;Cryotherapy;Electrical Stimulation;Iontophoresis 4mg /ml Dexamethasone;Moist Heat;Ultrasound;Therapeutic activities;Therapeutic exercise;Patient/family education;Dry needling;Passive range of motion;Taping;Manual techniques;Vasopneumatic Device;Neuromuscular re-education;Scar mobilization    PT Next Visit Plan  check HEP, AAROM and isometrics, manual    PT Home Exercise Plan  supine cane, isometrics     Consulted and Agree with Plan of Care  Patient       Patient will benefit from skilled therapeutic intervention in order to improve the following deficits and impairments:  Pain, Impaired UE functional use, Increased fascial restricitons, Decreased strength, Decreased endurance, Decreased activity tolerance, Improper body mechanics, Postural dysfunction, Decreased range of motion, Decreased mobility  Visit Diagnosis: Muscle weakness (generalized)  Stiffness of left shoulder, not  elsewhere classified  Acute pain of left shoulder     Problem List Patient Active Problem List   Diagnosis Date Noted  . Arm fatigue 07/15/2017  . Thumb pain, left 05/02/2017  . Anxiety 08/13/2016  . Acne 08/01/2015  . Family history of malignant neoplasm of breast 01/04/2014  . Family history of malignant neoplasm of ovary 01/04/2014  . Vitamin D deficiency disease 07/04/2011  . Osteopenia   . GERD (gastroesophageal reflux disease)     PAA,JENNIFER 10/31/2018, 4:43 PM  [facility] [address] [place], [place], [address] Phone: [phone]   Fax:  [phone]  Name: Katherine Vaughan MRN: [id number] Date of Birth: [birth year]   Raeford Razor, PT 10/31/18 4:43 PM Phone: [phone] Fax: [phone]

## 2018-11-03 ENCOUNTER — Other Ambulatory Visit: Payer: Self-pay | Admitting: Internal Medicine

## 2018-11-03 MED FILL — ESOMEPRAZOLE MAG DR 40 MG C: 40 | 30 days supply | Qty: 30 | Fill #0

## 2018-11-04 ENCOUNTER — Ambulatory Visit: Payer: 59 | Attending: Sports Medicine | Admitting: Physical Therapy

## 2018-11-04 ENCOUNTER — Other Ambulatory Visit: Payer: Self-pay

## 2018-11-04 ENCOUNTER — Encounter: Payer: Self-pay | Admitting: Physical Therapy

## 2018-11-04 DIAGNOSIS — G8929 Other chronic pain: Secondary | ICD-10-CM | POA: Diagnosis not present

## 2018-11-04 DIAGNOSIS — M25612 Stiffness of left shoulder, not elsewhere classified: Secondary | ICD-10-CM | POA: Insufficient documentation

## 2018-11-04 DIAGNOSIS — M25512 Pain in left shoulder: Secondary | ICD-10-CM | POA: Insufficient documentation

## 2018-11-04 DIAGNOSIS — M6281 Muscle weakness (generalized): Secondary | ICD-10-CM | POA: Diagnosis not present

## 2018-11-04 NOTE — Therapy (Signed)
[place], [place], [address] Phone: [phone]   Fax:  [phone]  Physical Therapy Treatment  Patient Details  Name: Katherine Vaughan MRN: [id number] Date of Birth: March 28, 1961 Referring Provider (PT): Dr. Edmonia Lynch   Encounter Date: 11/04/2018  PT End of Session - 11/04/18 0902    Visit Number  2    Number of Visits  16    Date for PT Re-Evaluation  08/15/18    PT Start Time  0900    PT Stop Time  0948    PT Time Calculation (min)  48 min       Past Medical History:  Diagnosis Date  . Arthritis   . GERD (gastroesophageal reflux disease)   . History of colon polyps 2006   adenomatous, no polyps in 2011  . History of hiatal hernia   . History of kidney stones   . Low blood pressure    occ.  . Migraines    decreased since menopause  . Osteopenia    dexa 08/2010: -2.3 L fem  . PONV (postoperative nausea and vomiting)    only with Demerol  . Skin cancer    Middle of chest  . Urine incontinence     Past Surgical History:  Procedure Laterality Date  . ABDOMINAL HYSTERECTOMY  2006  . BREAST EXCISIONAL BIOPSY Bilateral   . BREAST SURGERY     breast biopsy x's 2 (L) 92 and (R) 98  . COLONOSCOPY    . COLONOSCOPY WITH PROPOFOL N/A 02/11/2015   Procedure: COLONOSCOPY WITH PROPOFOL;  Surgeon: Clarene Essex, MD;  Location: [facility] ENDOSCOPY;  Service: Endoscopy;  Laterality: N/A;  . ESOPHAGOGASTRODUODENOSCOPY N/A 02/11/2015   Procedure: ESOPHAGOGASTRODUODENOSCOPY (EGD);  Surgeon: Clarene Essex, MD;  Location: [facility] ENDOSCOPY;  Service: Endoscopy;  Laterality: N/A;  . Laser endometriosis    . SEPTOPLASTY    . SHOULDER ARTHROSCOPY WITH BICEPS TENDON REPAIR Left 10/14/2018   Procedure: SHOULDER ARTHROSCOPY BICEP TENODESIS, SHOULDER DECOMPRESSION  AND SHOULDER MANIPULATION;  Surgeon: Renette Butters, MD;  Location: [place];  Service: Orthopedics;  Laterality: Left;  REGIONAL BLOCK  . TUBAL  LIGATION      There were no vitals filed for this visit.  Subjective Assessment - 11/04/18 0902    Subjective  No pain at the moment. Pain wtih reaching up or out.     Currently in Pain?  No/denies    Aggravating Factors   reaching up and out     Pain Relieving Factors  rest         [facility] PT Assessment - 11/04/18 0001      PROM   Left Shoulder Flexion  130 Degrees    Left Shoulder ABduction  --    Left Shoulder External Rotation  50 Degrees                   [facility] Adult PT Treatment/Exercise - 11/04/18 0001      Shoulder Exercises: Supine   Other Supine Exercises  supine cane exercises : chest press , shoulder flexionn, ER and abduction x 10 , protraction      Shoulder Exercises: Standing   Retraction  10 reps    Retraction Limitations  standing    Other Standing Exercises  wall slides for flexion       Shoulder Exercises: Pulleys   Flexion  2 minutes    ABduction  1 minute      Shoulder Exercises: Isometric  Strengthening   Flexion  5X5"    External Rotation  5X5"    Internal Rotation  5X5"    ABduction  5X5"      Modalities   Modalities  Cryotherapy      Cryotherapy   Number Minutes Cryotherapy  10 Minutes    Cryotherapy Location  Shoulder    Type of Cryotherapy  Ice pack      Manual Therapy   Manual Therapy  Soft tissue mobilization;Passive ROM    Joint Mobilization  grade II A/P, inferior     Soft tissue mobilization  efflourage to anterior deltoid/bicep    Passive ROM  flexion, ER/IR                PT Short Term Goals - 10/31/18 1617      PT SHORT TERM GOAL #1   Title  pt to be I with inital HEP     Time  4    Period  Weeks    Status  New    Target Date  11/28/18      PT SHORT TERM GOAL #2   Title  Pt will utilize heat/ice as needed for post session soreness, pain     Time  4    Period  Weeks    Status  New    Target Date  11/28/18      PT SHORT TERM GOAL #3   Title  Pt will begin to learn good body awareness and  scapular position when performing HEP     Time  4    Period  Weeks    Status  New    Target Date  11/28/18        PT Long Term Goals - 10/31/18 1619      PT LONG TERM GOAL #1   Title  Pt will be able to reach to mid low back with L UE for ADLs, dressing and pain <2/10     Time  8    Period  Weeks    Status  New    Target Date  12/26/18      PT LONG TERM GOAL #2   Title  Pt will increase L shoulder strengtht to >/= 4+/5 in all planes to promote shoulder stability while lifting, carrying items.     Baseline  3+/5 to 4_/5 post surgical     Time  8    Period  Weeks    Status  New    Target Date  12/26/18      PT LONG TERM GOAL #3   Title  Pt will be able to lift arm to 120 deg or more with light weight (<5 lbs)  for functional use in kitchen and at work     Time  8    Period  Weeks    Status  New    Target Date  12/26/18      PT LONG TERM GOAL #4   Title  increase FOTO score to predicted level or less limited to demo improvement in function     Time  8    Period  Weeks    Status  New    Target Date  12/26/18      PT LONG TERM GOAL #5   Title  pt to be I with all HEP given as of last visit to maintain and progress current level of function     Time  8    Period  Weeks  Status  New    Target Date  12/26/18            Plan - 11/04/18 1008    Clinical Impression Statement  Pt reports min compliance with HEP and continued pain with reaching. We reviewed her HEP which required min cues. Manual soft tissue performed to anterior deltoid/ bicep. Followed up with ice pack for pain and soreness.  PROM performed to tolerance for recovery of motion.     PT Next Visit Plan  check HEP, AAROM and isometrics, manual    PT Home Exercise Plan  supine cane, isometrics     Consulted and Agree with Plan of Care  Patient       Patient will benefit from skilled therapeutic intervention in order to improve the following deficits and impairments:  Pain, Impaired UE functional use,  Increased fascial restricitons, Decreased strength, Decreased endurance, Decreased activity tolerance, Improper body mechanics, Postural dysfunction, Decreased range of motion, Decreased mobility  Visit Diagnosis: Muscle weakness (generalized)  Stiffness of left shoulder, not elsewhere classified  Acute pain of left shoulder  Chronic left shoulder pain     Problem List Patient Active Problem List   Diagnosis Date Noted  . Arm fatigue 07/15/2017  . Thumb pain, left 05/02/2017  . Anxiety 08/13/2016  . Acne 08/01/2015  . Family history of malignant neoplasm of breast 01/04/2014  . Family history of malignant neoplasm of ovary 01/04/2014  . Vitamin D deficiency disease 07/04/2011  . Osteopenia   . GERD (gastroesophageal reflux disease)     Dorene Ar, PTA 11/04/2018, 10:16 AM  [facility] [address] [place], [place], [address] Phone: [phone]   Fax:  [phone]  Name: Katherine Vaughan MRN: [id number] Date of Birth: 07-02-1960

## 2018-11-05 ENCOUNTER — Other Ambulatory Visit: Payer: Self-pay

## 2018-11-05 MED ORDER — ESOMEPRAZOLE MAGNESIUM 40 MG PO CPDR: 40.0000 mg | DELAYED_RELEASE_CAPSULE | Freq: Every day | ORAL | 1 refills | Status: DC

## 2018-11-06 ENCOUNTER — Other Ambulatory Visit: Payer: Self-pay

## 2018-11-06 ENCOUNTER — Encounter: Payer: Self-pay | Admitting: Physical Therapy

## 2018-11-06 ENCOUNTER — Ambulatory Visit: Payer: 59 | Admitting: Physical Therapy

## 2018-11-06 DIAGNOSIS — M25612 Stiffness of left shoulder, not elsewhere classified: Secondary | ICD-10-CM | POA: Diagnosis not present

## 2018-11-06 DIAGNOSIS — M6281 Muscle weakness (generalized): Secondary | ICD-10-CM | POA: Diagnosis not present

## 2018-11-06 DIAGNOSIS — M25512 Pain in left shoulder: Secondary | ICD-10-CM | POA: Diagnosis not present

## 2018-11-06 DIAGNOSIS — G8929 Other chronic pain: Secondary | ICD-10-CM | POA: Diagnosis not present

## 2018-11-06 NOTE — Therapy (Signed)
[place], [place], [address] Phone: [phone]   Fax:  [phone]  Physical Therapy Treatment  Patient Details  Name: Katherine Vaughan MRN: [id number] Date of Birth: [birth year] Referring Provider (PT): Dr. Edmonia Lynch   Encounter Date: 11/06/2018  PT End of Session - 11/06/18 1110    Visit Number  3    Number of Visits  16    Date for PT Re-Evaluation  08/15/18    PT Start Time  1034    PT Stop Time  1120    PT Time Calculation (min)  46 min    Activity Tolerance  Patient tolerated treatment well    Behavior During Therapy  [facility] for tasks assessed/performed       Past Medical History:  Diagnosis Date  . Arthritis   . GERD (gastroesophageal reflux disease)   . History of colon polyps 2006   adenomatous, no polyps in 2011  . History of hiatal hernia   . History of kidney stones   . Low blood pressure    occ.  . Migraines    decreased since menopause  . Osteopenia    dexa 08/2010: -2.3 L fem  . PONV (postoperative nausea and vomiting)    only with Demerol  . Skin cancer    Middle of chest  . Urine incontinence     Past Surgical History:  Procedure Laterality Date  . ABDOMINAL HYSTERECTOMY  2006  . BREAST EXCISIONAL BIOPSY Bilateral   . BREAST SURGERY     breast biopsy x's 2 (L) 92 and (R) 98  . COLONOSCOPY    . COLONOSCOPY WITH PROPOFOL N/A 02/11/2015   Procedure: COLONOSCOPY WITH PROPOFOL;  Surgeon: Clarene Essex, MD;  Location: [facility] ENDOSCOPY;  Service: Endoscopy;  Laterality: N/A;  . ESOPHAGOGASTRODUODENOSCOPY N/A 02/11/2015   Procedure: ESOPHAGOGASTRODUODENOSCOPY (EGD);  Surgeon: Clarene Essex, MD;  Location: [facility] ENDOSCOPY;  Service: Endoscopy;  Laterality: N/A;  . Laser endometriosis    . SEPTOPLASTY    . SHOULDER ARTHROSCOPY WITH BICEPS TENDON REPAIR Left 10/14/2018   Procedure: SHOULDER ARTHROSCOPY BICEP TENODESIS, SHOULDER DECOMPRESSION  AND SHOULDER MANIPULATION;  Surgeon: Renette Butters,  MD;  Location: [place];  Service: Orthopedics;  Laterality: Left;  REGIONAL BLOCK  . TUBAL LIGATION      There were no vitals filed for this visit.  Subjective Assessment - 11/06/18 1037    Subjective  NO pain at rest but continued pain with reaching    Pertinent History  SHOULDER ARTHROSCOPY BICEP TENODESIS, SHOULDER DECOMPRESSION  AND SHOULDER MANIPULATION , arthritis, osteopenia     Currently in Pain?  Other (Comment)   no pain at rest,                       [facility] Adult PT Treatment/Exercise - 11/06/18 0001      Shoulder Exercises: Supine   Other Supine Exercises  supine cane exercises : chest press , shoulder flexionn, ER and abduction x 10 , protraction      Shoulder Exercises: Standing   Retraction  15 reps    Other Standing Exercises  UE ranger flexion and scaption and circles X 10 ea, standing at high mat table rolling Pball up big bolster for shoulder flexion and abd ROM X 10 ea, finger ladder X 10 flexion      Shoulder Exercises: Pulleys   Flexion  2 minutes    ABduction  2 minutes  Shoulder Exercises: ROM/Strengthening   Other ROM/Strengthening Exercises  IR stretch behind back with strap gentle for 10 sec X 5 reps, biceps and anterior shoulder stretch grasping end of mat table and stepping fwd 30 sec X 3      Shoulder Exercises: Isometric Strengthening   Flexion  5X5"    External Rotation  5X5"    Internal Rotation  5X5"    ABduction  5X5"      Modalities   Modalities  --   declined     Manual Therapy   Manual therapy comments  PROM all planes to tolerance, gentle distraction, inf glides and A-P GH glides, gentle STM to Lt shoulder               PT Short Term Goals - 10/31/18 1617      PT SHORT TERM GOAL #1   Title  pt to be I with inital HEP     Time  4    Period  Weeks    Status  New    Target Date  11/28/18      PT SHORT TERM GOAL #2   Title  Pt will utilize heat/ice as needed for post session  soreness, pain     Time  4    Period  Weeks    Status  New    Target Date  11/28/18      PT SHORT TERM GOAL #3   Title  Pt will begin to learn good body awareness and scapular position when performing HEP     Time  4    Period  Weeks    Status  New    Target Date  11/28/18        PT Long Term Goals - 10/31/18 1619      PT LONG TERM GOAL #1   Title  Pt will be able to reach to mid low back with L UE for ADLs, dressing and pain <2/10     Time  8    Period  Weeks    Status  New    Target Date  12/26/18      PT LONG TERM GOAL #2   Title  Pt will increase L shoulder strengtht to >/= 4+/5 in all planes to promote shoulder stability while lifting, carrying items.     Baseline  3+/5 to 4_/5 post surgical     Time  8    Period  Weeks    Status  New    Target Date  12/26/18      PT LONG TERM GOAL #3   Title  Pt will be able to lift arm to 120 deg or more with light weight (<5 lbs)  for functional use in kitchen and at work     Time  8    Period  Weeks    Status  New    Target Date  12/26/18      PT LONG TERM GOAL #4   Title  increase FOTO score to predicted level or less limited to demo improvement in function     Time  8    Period  Weeks    Status  New    Target Date  12/26/18      PT LONG TERM GOAL #5   Title  pt to be I with all HEP given as of last visit to maintain and progress current level of function     Time  8    Period  Weeks    Status  New    Target Date  12/26/18            Plan - 11/06/18 1112    Clinical Impression Statement  Pt able to progress her ROM and stretching program today with good tolerance. She does still have pain at end range and was encouraged to stay in pain free ROM. PT will continue to progress as tolerated towards her functional goals.    PT Next Visit Plan  check HEP, AAROM and isometrics, manual    PT Home Exercise Plan  supine cane, isometrics     Consulted and Agree with Plan of Care  Patient       Patient will benefit  from skilled therapeutic intervention in order to improve the following deficits and impairments:  Pain, Impaired UE functional use, Increased fascial restricitons, Decreased strength, Decreased endurance, Decreased activity tolerance, Improper body mechanics, Postural dysfunction, Decreased range of motion, Decreased mobility  Visit Diagnosis: Muscle weakness (generalized)  Stiffness of left shoulder, not elsewhere classified  Acute pain of left shoulder     Problem List Patient Active Problem List   Diagnosis Date Noted  . Arm fatigue 07/15/2017  . Thumb pain, left 05/02/2017  . Anxiety 08/13/2016  . Acne 08/01/2015  . Family history of malignant neoplasm of breast 01/04/2014  . Family history of malignant neoplasm of ovary 01/04/2014  . Vitamin D deficiency disease 07/04/2011  . Osteopenia   . GERD (gastroesophageal reflux disease)     Silvestre Mesi 11/06/2018, 11:25 AM  [facility] [address] [place], [place], [address] Phone: [phone]   Fax:  [phone]  Name: KILEEN LANGE MRN: [id number] Date of Birth: [birth year]

## 2018-11-07 ENCOUNTER — Encounter: Payer: 59 | Admitting: Physical Therapy

## 2018-11-11 ENCOUNTER — Encounter: Payer: Self-pay | Admitting: Physical Therapy

## 2018-11-11 ENCOUNTER — Other Ambulatory Visit: Payer: Self-pay

## 2018-11-11 ENCOUNTER — Ambulatory Visit: Payer: 59 | Admitting: Physical Therapy

## 2018-11-11 DIAGNOSIS — M25512 Pain in left shoulder: Secondary | ICD-10-CM | POA: Diagnosis not present

## 2018-11-11 DIAGNOSIS — G8929 Other chronic pain: Secondary | ICD-10-CM | POA: Diagnosis not present

## 2018-11-11 DIAGNOSIS — M6281 Muscle weakness (generalized): Secondary | ICD-10-CM | POA: Diagnosis not present

## 2018-11-11 DIAGNOSIS — M25612 Stiffness of left shoulder, not elsewhere classified: Secondary | ICD-10-CM | POA: Diagnosis not present

## 2018-11-11 NOTE — Therapy (Signed)
[place], [place], [address] Phone: [phone]   Fax:  [phone]  Physical Therapy Treatment  Patient Details  Name: Katherine Vaughan MRN: [id number] Date of Birth: 23-Nov-1960 Referring Provider (PT): Dr. Edmonia Lynch   Encounter Date: 11/11/2018  PT End of Session - 11/11/18 0909    Visit Number  4    Number of Visits  16    Date for PT Re-Evaluation  12/26/18    PT Start Time  0905    PT Stop Time  0955    PT Time Calculation (min)  50 min    Activity Tolerance  Patient tolerated treatment well    Behavior During Therapy  [facility] for tasks assessed/performed       Past Medical History:  Diagnosis Date  . Arthritis   . GERD (gastroesophageal reflux disease)   . History of colon polyps 2006   adenomatous, no polyps in 2011  . History of hiatal hernia   . History of kidney stones   . Low blood pressure    occ.  . Migraines    decreased since menopause  . Osteopenia    dexa 08/2010: -2.3 L fem  . PONV (postoperative nausea and vomiting)    only with Demerol  . Skin cancer    Middle of chest  . Urine incontinence     Past Surgical History:  Procedure Laterality Date  . ABDOMINAL HYSTERECTOMY  2006  . BREAST EXCISIONAL BIOPSY Bilateral   . BREAST SURGERY     breast biopsy x's 2 (L) 92 and (R) 98  . COLONOSCOPY    . COLONOSCOPY WITH PROPOFOL N/A 02/11/2015   Procedure: COLONOSCOPY WITH PROPOFOL;  Surgeon: Clarene Essex, MD;  Location: [facility] ENDOSCOPY;  Service: Endoscopy;  Laterality: N/A;  . ESOPHAGOGASTRODUODENOSCOPY N/A 02/11/2015   Procedure: ESOPHAGOGASTRODUODENOSCOPY (EGD);  Surgeon: Clarene Essex, MD;  Location: [facility] ENDOSCOPY;  Service: Endoscopy;  Laterality: N/A;  . Laser endometriosis    . SEPTOPLASTY    . SHOULDER ARTHROSCOPY WITH BICEPS TENDON REPAIR Left 10/14/2018   Procedure: SHOULDER ARTHROSCOPY BICEP TENODESIS, SHOULDER DECOMPRESSION  AND SHOULDER MANIPULATION;  Surgeon: Renette Butters,  MD;  Location: [place];  Service: Orthopedics;  Laterality: Left;  REGIONAL BLOCK  . TUBAL LIGATION      There were no vitals filed for this visit.  Subjective Assessment - 11/11/18 0907    Subjective  I can tell its getting better.      Currently in Pain?  Yes    Pain Score  1     Pain Location  Shoulder    Pain Orientation  Left    Pain Descriptors / Indicators  Sore    Pain Type  Chronic pain;Surgical pain    Pain Onset  More than a month ago    Pain Frequency  Intermittent    Aggravating Factors   using it , reaching     Pain Relieving Factors  rest, ice           [facility] Adult PT Treatment/Exercise - 11/11/18 0001      Shoulder Exercises: Standing   Horizontal ABduction  AAROM;Both;10 reps    Extension  Strengthening;Both;10 reps    Extension Weight (lbs)  into the wall isometric     Retraction  15 reps      Shoulder Exercises: Pulleys   Flexion  2 minutes    Scaption  2 minutes    ABduction Limitations  and horizontal  abd       Shoulder Exercises: ROM/Strengthening   Ranger  Flexion and scaption with weightshift for about 1 min    level 27, then 25 for scaption      Modalities   Modalities  Moist Heat      Moist Heat Therapy   Number Minutes Moist Heat  10 Minutes    Moist Heat Location  Shoulder      Manual Therapy   Manual therapy comments  PROM all planes to tolerance, gentle distraction, inf glides and A-P GH glides, gentle STM to Lt shoulder    Joint Mobilization  grade II A/P, inferior                PT Short Term Goals - 11/11/18 4081      PT SHORT TERM GOAL #1   Title  pt to be I with inital HEP     Status  Achieved      PT SHORT TERM GOAL #2   Title  Pt will utilize heat/ice as needed for post session soreness, pain     Baseline  uses minimally, takes bath usually , knows RICE     Status  Achieved      PT SHORT TERM GOAL #3   Title  Pt will begin to learn good body awareness and scapular position when performing  HEP     Status  Achieved        PT Long Term Goals - 10/31/18 1619      PT LONG TERM GOAL #1   Title  Pt will be able to reach to mid low back with L UE for ADLs, dressing and pain <2/10     Time  8    Period  Weeks    Status  New    Target Date  12/26/18      PT LONG TERM GOAL #2   Title  Pt will increase L shoulder strengtht to >/= 4+/5 in all planes to promote shoulder stability while lifting, carrying items.     Baseline  3+/5 to 4_/5 post surgical     Time  8    Period  Weeks    Status  New    Target Date  12/26/18      PT LONG TERM GOAL #3   Title  Pt will be able to lift arm to 120 deg or more with light weight (<5 lbs)  for functional use in kitchen and at work     Time  8    Period  Weeks    Status  New    Target Date  12/26/18      PT LONG TERM GOAL #4   Title  increase FOTO score to predicted level or less limited to demo improvement in function     Time  8    Period  Weeks    Status  New    Target Date  12/26/18      PT LONG TERM GOAL #5   Title  pt to be I with all HEP given as of last visit to maintain and progress current level of function     Time  8    Period  Weeks    Status  New    Target Date  12/26/18            Plan - 11/11/18 0925    Clinical Impression Statement  Pt doing well, sleep is improving and tolerates AAROM with min increase  in pain from baseline.  4 weeks out from surgery today.  Expect a great outcome for her shoulder.     PT Treatment/Interventions  ADLs/Self Care Home Management;Cryotherapy;Electrical Stimulation;Iontophoresis 4mg /ml Dexamethasone;Moist Heat;Ultrasound;Therapeutic activities;Therapeutic exercise;Patient/family education;Dry needling;Passive range of motion;Taping;Manual techniques;Vasopneumatic Device;Neuromuscular re-education;Scar mobilization    PT Next Visit Plan  Sees MD 6/19, cont AAROM, isometric, manual     PT Home Exercise Plan  supine cane, isometrics     Consulted and Agree with Plan of Care   Patient       Patient will benefit from skilled therapeutic intervention in order to improve the following deficits and impairments:  Pain, Impaired UE functional use, Increased fascial restricitons, Decreased strength, Decreased endurance, Decreased activity tolerance, Improper body mechanics, Postural dysfunction, Decreased range of motion, Decreased mobility  Visit Diagnosis: Muscle weakness (generalized)  Stiffness of left shoulder, not elsewhere classified  Acute pain of left shoulder     Problem List Patient Active Problem List   Diagnosis Date Noted  . Arm fatigue 07/15/2017  . Thumb pain, left 05/02/2017  . Anxiety 08/13/2016  . Acne 08/01/2015  . Family history of malignant neoplasm of breast 01/04/2014  . Family history of malignant neoplasm of ovary 01/04/2014  . Vitamin D deficiency disease 07/04/2011  . Osteopenia   . GERD (gastroesophageal reflux disease)     Katherine Vaughan 11/11/2018, 9:53 AM  [place], [place], [address] Phone: [phone]   Fax:  [phone]  Name: Katherine Vaughan MRN: [id number] Date of Birth: 1960/09/12   Raeford Razor, PT 11/11/18 9:53 AM Phone: [phone] Fax: [phone]

## 2018-11-13 ENCOUNTER — Other Ambulatory Visit: Payer: Self-pay

## 2018-11-13 ENCOUNTER — Ambulatory Visit: Payer: 59 | Admitting: Physical Therapy

## 2018-11-13 ENCOUNTER — Encounter: Payer: Self-pay | Admitting: Physical Therapy

## 2018-11-13 DIAGNOSIS — M25512 Pain in left shoulder: Secondary | ICD-10-CM | POA: Diagnosis not present

## 2018-11-13 DIAGNOSIS — M25612 Stiffness of left shoulder, not elsewhere classified: Secondary | ICD-10-CM | POA: Diagnosis not present

## 2018-11-13 DIAGNOSIS — G8929 Other chronic pain: Secondary | ICD-10-CM | POA: Diagnosis not present

## 2018-11-13 DIAGNOSIS — M6281 Muscle weakness (generalized): Secondary | ICD-10-CM

## 2018-11-13 NOTE — Therapy (Signed)
[place], [place], [address] Phone: [phone]   Fax:  [phone]  Physical Therapy Treatment  Patient Details  Name: Katherine Vaughan MRN: [id number] Date of Birth: 04/29/1961 Referring Provider (PT): Dr. Edmonia Lynch   Encounter Date: 11/13/2018  PT End of Session - 11/13/18 0904    Visit Number  5    Number of Visits  16    Date for PT Re-Evaluation  12/26/18    PT Start Time  0904    PT Stop Time  0955    PT Time Calculation (min)  51 min    Activity Tolerance  Patient tolerated treatment well    Behavior During Therapy  [facility] for tasks assessed/performed       Past Medical History:  Diagnosis Date  . Arthritis   . GERD (gastroesophageal reflux disease)   . History of colon polyps 2006   adenomatous, no polyps in 2011  . History of hiatal hernia   . History of kidney stones   . Low blood pressure    occ.  . Migraines    decreased since menopause  . Osteopenia    dexa 08/2010: -2.3 L fem  . PONV (postoperative nausea and vomiting)    only with Demerol  . Skin cancer    Middle of chest  . Urine incontinence     Past Surgical History:  Procedure Laterality Date  . ABDOMINAL HYSTERECTOMY  2006  . BREAST EXCISIONAL BIOPSY Bilateral   . BREAST SURGERY     breast biopsy x's 2 (L) 92 and (R) 98  . COLONOSCOPY    . COLONOSCOPY WITH PROPOFOL N/A 02/11/2015   Procedure: COLONOSCOPY WITH PROPOFOL;  Surgeon: Clarene Essex, MD;  Location: [facility] ENDOSCOPY;  Service: Endoscopy;  Laterality: N/A;  . ESOPHAGOGASTRODUODENOSCOPY N/A 02/11/2015   Procedure: ESOPHAGOGASTRODUODENOSCOPY (EGD);  Surgeon: Clarene Essex, MD;  Location: [facility] ENDOSCOPY;  Service: Endoscopy;  Laterality: N/A;  . Laser endometriosis    . SEPTOPLASTY    . SHOULDER ARTHROSCOPY WITH BICEPS TENDON REPAIR Left 10/14/2018   Procedure: SHOULDER ARTHROSCOPY BICEP TENODESIS, SHOULDER DECOMPRESSION  AND SHOULDER MANIPULATION;  Surgeon: Renette Butters, MD;  Location: [place];  Service: Orthopedics;  Laterality: Left;  REGIONAL BLOCK  . TUBAL LIGATION      There were no vitals filed for this visit.  Subjective Assessment - 11/13/18 0905    Subjective  "I am doing pretty good, No pain today. Yesterday I did have pain but not sure"    Patient Stated Goals  to decrease pain, return to normal activity no issues or limitation    Currently in Pain?  Yes    Pain Score  0-No pain         [facility] PT Assessment - 11/13/18 0001      Assessment   Medical Diagnosis  L shoulder manipulation and biceps tenodesis                    [facility] Adult PT Treatment/Exercise - 11/13/18 0001      Elbow Exercises   Other elbow exercises  unloaded bicep flexion with hand supinated/ prontated 2 x 15 ea.      Shoulder Exercises: Seated   Row  10 reps;Strengthening;Both;Theraband    Protraction  Strengthening;12 reps   with arm on UE range using yellow    External Rotation  Strengthening;Left;10 reps;Theraband   x 2 sets   Theraband Level (Shoulder External Rotation)  Level 1 (Yellow)    Internal Rotation  Strengthening;Left;10 reps;Theraband    Theraband Level (Shoulder Internal Rotation)  Level 1 (Yellow)      Shoulder Exercises: Pulleys   Flexion  2 minutes    Scaption  2 minutes      Shoulder Exercises: ROM/Strengthening   Other ROM/Strengthening Exercises  reaching behind back combined with inferior GHJ glide     Other ROM/Strengthening Exercises  wall ladder 5 x flexion with eccentric descent, scaption angle 5 x with controlling eccentric descent      Cryotherapy   Number Minutes Cryotherapy  10 Minutes    Cryotherapy Location  Shoulder    Type of Cryotherapy  Ice pack   with pt in sitting     Manual Therapy   Manual therapy comments  Desensitization x 5 min over proximal bicep brachii    Joint Mobilization  grade II A/P, inferior   inferior combined with IR in standing               PT Short  Term Goals - 11/11/18 6387      PT SHORT TERM GOAL #1   Title  pt to be I with inital HEP     Status  Achieved      PT SHORT TERM GOAL #2   Title  Pt will utilize heat/ice as needed for post session soreness, pain     Baseline  uses minimally, takes bath usually , knows RICE     Status  Achieved      PT SHORT TERM GOAL #3   Title  Pt will begin to learn good body awareness and scapular position when performing HEP     Status  Achieved        PT Long Term Goals - 10/31/18 1619      PT LONG TERM GOAL #1   Title  Pt will be able to reach to mid low back with L UE for ADLs, dressing and pain <2/10     Time  8    Period  Weeks    Status  New    Target Date  12/26/18      PT LONG TERM GOAL #2   Title  Pt will increase L shoulder strengtht to >/= 4+/5 in all planes to promote shoulder stability while lifting, carrying items.     Baseline  3+/5 to 4_/5 post surgical     Time  8    Period  Weeks    Status  New    Target Date  12/26/18      PT LONG TERM GOAL #3   Title  Pt will be able to lift arm to 120 deg or more with light weight (<5 lbs)  for functional use in kitchen and at work     Time  8    Period  Weeks    Status  New    Target Date  12/26/18      PT LONG TERM GOAL #4   Title  increase FOTO score to predicted level or less limited to demo improvement in function     Time  8    Period  Weeks    Status  New    Target Date  12/26/18      PT LONG TERM GOAL #5   Title  pt to be I with all HEP given as of last visit to maintain and progress current level of function     Time  8  Period  Weeks    Status  New    Target Date  12/26/18            Plan - 11/13/18 0950    Clinical Impression Statement  pt reports no pain today and continues to demo improvement iwth AAROM. progressed strengthening to isontonics with using yellow theraband which she performed well. ice end of session to calm down soreness.    PT Treatment/Interventions  ADLs/Self Care Home  Management;Cryotherapy;Electrical Stimulation;Iontophoresis 4mg /ml Dexamethasone;Moist Heat;Ultrasound;Therapeutic activities;Therapeutic exercise;Patient/family education;Dry needling;Passive range of motion;Taping;Manual techniques;Vasopneumatic Device;Neuromuscular re-education;Scar mobilization    PT Next Visit Plan  Sees MD 6/19, cont AAROM, isometric, manual     PT Home Exercise Plan  supine cane, isometrics        Patient will benefit from skilled therapeutic intervention in order to improve the following deficits and impairments:  Pain, Impaired UE functional use, Increased fascial restricitons, Decreased strength, Decreased endurance, Decreased activity tolerance, Improper body mechanics, Postural dysfunction, Decreased range of motion, Decreased mobility  Visit Diagnosis: Muscle weakness (generalized)  Stiffness of left shoulder, not elsewhere classified  Acute pain of left shoulder  Chronic left shoulder pain     Problem List Patient Active Problem List   Diagnosis Date Noted  . Arm fatigue 07/15/2017  . Thumb pain, left 05/02/2017  . Anxiety 08/13/2016  . Acne 08/01/2015  . Family history of malignant neoplasm of breast 01/04/2014  . Family history of malignant neoplasm of ovary 01/04/2014  . Vitamin D deficiency disease 07/04/2011  . Osteopenia   . GERD (gastroesophageal reflux disease)    Starr Lake PT, DPT, LAT, ATC  11/13/18  9:53 AM      [facility] [address] [place], [place], [address] Phone: [phone]   Fax:  [phone]  Name: SIDRA OLDFIELD MRN: [id number] Date of Birth: [birth year]

## 2018-11-18 ENCOUNTER — Other Ambulatory Visit: Payer: Self-pay

## 2018-11-18 ENCOUNTER — Ambulatory Visit: Payer: 59 | Admitting: Physical Therapy

## 2018-11-18 ENCOUNTER — Encounter: Payer: Self-pay | Admitting: Physical Therapy

## 2018-11-18 DIAGNOSIS — M25612 Stiffness of left shoulder, not elsewhere classified: Secondary | ICD-10-CM | POA: Diagnosis not present

## 2018-11-18 DIAGNOSIS — M6281 Muscle weakness (generalized): Secondary | ICD-10-CM

## 2018-11-18 DIAGNOSIS — M25512 Pain in left shoulder: Secondary | ICD-10-CM | POA: Diagnosis not present

## 2018-11-18 DIAGNOSIS — G8929 Other chronic pain: Secondary | ICD-10-CM | POA: Diagnosis not present

## 2018-11-18 NOTE — Therapy (Signed)
[place], [place], [address] Phone: [phone]   Fax:  [phone]  Physical Therapy Treatment  Patient Details  Name: Katherine Vaughan MRN: [id number] Date of Birth: 1960-12-31 Referring Provider (PT): Dr. Edmonia Lynch   Encounter Date: 11/18/2018  PT End of Session - 11/18/18 0942    Visit Number  6    Number of Visits  16    PT Start Time  0908    PT Stop Time  0955    PT Time Calculation (min)  47 min    Activity Tolerance  Patient tolerated treatment well    Behavior During Therapy  [facility] for tasks assessed/performed       Past Medical History:  Diagnosis Date  . Arthritis   . GERD (gastroesophageal reflux disease)   . History of colon polyps 2006   adenomatous, no polyps in 2011  . History of hiatal hernia   . History of kidney stones   . Low blood pressure    occ.  . Migraines    decreased since menopause  . Osteopenia    dexa 08/2010: -2.3 L fem  . PONV (postoperative nausea and vomiting)    only with Demerol  . Skin cancer    Middle of chest  . Urine incontinence     Past Surgical History:  Procedure Laterality Date  . ABDOMINAL HYSTERECTOMY  2006  . BREAST EXCISIONAL BIOPSY Bilateral   . BREAST SURGERY     breast biopsy x's 2 (L) 92 and (R) 98  . COLONOSCOPY    . COLONOSCOPY WITH PROPOFOL N/A 02/11/2015   Procedure: COLONOSCOPY WITH PROPOFOL;  Surgeon: Clarene Essex, MD;  Location: [facility] ENDOSCOPY;  Service: Endoscopy;  Laterality: N/A;  . ESOPHAGOGASTRODUODENOSCOPY N/A 02/11/2015   Procedure: ESOPHAGOGASTRODUODENOSCOPY (EGD);  Surgeon: Clarene Essex, MD;  Location: [facility] ENDOSCOPY;  Service: Endoscopy;  Laterality: N/A;  . Laser endometriosis    . SEPTOPLASTY    . SHOULDER ARTHROSCOPY WITH BICEPS TENDON REPAIR Left 10/14/2018   Procedure: SHOULDER ARTHROSCOPY BICEP TENODESIS, SHOULDER DECOMPRESSION  AND SHOULDER MANIPULATION;  Surgeon: Renette Butters, MD;  Location: [place];  Service: Orthopedics;  Laterality: Left;  REGIONAL BLOCK  . TUBAL LIGATION      There were no vitals filed for this visit.  Subjective Assessment - 11/18/18 0910    Subjective  Achy today.    Currently in Pain?  Yes    Pain Score  2     Pain Location  Shoulder    Pain Orientation  Left    Pain Descriptors / Indicators  Aching    Pain Type  Surgical pain    Pain Onset  More than a month ago    Pain Frequency  Intermittent         [facility] PT Assessment - 11/18/18 0001      AROM   Left Shoulder Extension  35 Degrees    Left Shoulder Flexion  136 Degrees    Left Shoulder ABduction  112 Degrees    Left Shoulder Internal Rotation  --   FR to upper lumbar    Left Shoulder External Rotation  --   FR to T3     Strength   Left Shoulder Flexion  4/5    Left Shoulder ABduction  4-/5        [facility] Adult PT Treatment/Exercise - 11/18/18 0001      Shoulder Exercises: Standing   Flexion  AAROM;Strengthening;Both;12 reps  Shoulder Flexion Weight (lbs)  cane 2 sets 1 set palms up 1 set palms down     ABduction  AAROM;Left;10 reps    Shoulder ABduction Weight (lbs)  cane     Extension  Strengthening;Both;10 reps    Extension Weight (lbs)  cane       Shoulder Exercises: Pulleys   Flexion  2 minutes    Scaption  2 minutes    ABduction Limitations  and horizontal abd       Shoulder Exercises: ROM/Strengthening   Wall Pushups  10 reps    Wall Pushups Limitations  triceps      Manual Therapy   Joint Mobilization  GR II inferior glides    Passive ROM  all planes to tolerance               PT Short Term Goals - 11/11/18 1601      PT SHORT TERM GOAL #1   Title  pt to be I with inital HEP     Status  Achieved      PT SHORT TERM GOAL #2   Title  Pt will utilize heat/ice as needed for post session soreness, pain     Baseline  uses minimally, takes bath usually , knows RICE     Status  Achieved      PT SHORT TERM GOAL #3   Title  Pt will begin to learn good  body awareness and scapular position when performing HEP     Status  Achieved        PT Long Term Goals - 10/31/18 1619      PT LONG TERM GOAL #1   Title  Pt will be able to reach to mid low back with L UE for ADLs, dressing and pain <2/10     Time  8    Period  Weeks    Status  New    Target Date  12/26/18      PT LONG TERM GOAL #2   Title  Pt will increase L shoulder strengtht to >/= 4+/5 in all planes to promote shoulder stability while lifting, carrying items.     Baseline  3+/5 to 4_/5 post surgical     Time  8    Period  Weeks    Status  New    Target Date  12/26/18      PT LONG TERM GOAL #3   Title  Pt will be able to lift arm to 120 deg or more with light weight (<5 lbs)  for functional use in kitchen and at work     Time  8    Period  Weeks    Status  New    Target Date  12/26/18      PT LONG TERM GOAL #4   Title  increase FOTO score to predicted level or less limited to demo improvement in function     Time  8    Period  Weeks    Status  New    Target Date  12/26/18      PT LONG TERM GOAL #5   Title  pt to be I with all HEP given as of last visit to maintain and progress current level of function     Time  8    Period  Weeks    Status  New    Target Date  12/26/18            Plan - 11/18/18 0944  Clinical Impression Statement  Patient continues to benefit from PT for L UE ROM and strength following surgery.  Sees MD Friday.  AROM improved in all planes.  Will begin more band exercises next visit.    PT Treatment/Interventions  ADLs/Self Care Home Management;Cryotherapy;Electrical Stimulation;Iontophoresis 4mg /ml Dexamethasone;Moist Heat;Ultrasound;Therapeutic activities;Therapeutic exercise;Patient/family education;Dry needling;Passive range of motion;Taping;Manual techniques;Vasopneumatic Device;Neuromuscular re-education;Scar mobilization    PT Next Visit Plan  Sees MD 6/19, cont AAROM, isometric, manual     PT Home Exercise Plan  supine cane,  isometrics        Patient will benefit from skilled therapeutic intervention in order to improve the following deficits and impairments:  Pain, Impaired UE functional use, Increased fascial restricitons, Decreased strength, Decreased endurance, Decreased activity tolerance, Improper body mechanics, Postural dysfunction, Decreased range of motion, Decreased mobility  Visit Diagnosis: 1. Muscle weakness (generalized)   2. Stiffness of left shoulder, not elsewhere classified   3. Acute pain of left shoulder   4. Chronic left shoulder pain        Problem List Patient Active Problem List   Diagnosis Date Noted  . Arm fatigue 07/15/2017  . Thumb pain, left 05/02/2017  . Anxiety 08/13/2016  . Acne 08/01/2015  . Family history of malignant neoplasm of breast 01/04/2014  . Family history of malignant neoplasm of ovary 01/04/2014  . Vitamin D deficiency disease 07/04/2011  . Osteopenia   . GERD (gastroesophageal reflux disease)     Draycen Leichter 11/18/2018, 9:47 AM  [facility] [address] [place], [place], [address] Phone: [phone]   Fax:  [phone]  Name: Katherine Vaughan MRN: [id number] Date of Birth: Jun 05, 1960  Raeford Razor, PT 11/18/18 9:48 AM Phone: [phone] Fax: [phone]

## 2018-11-19 MED FILL — ESTRADIOL 0.05 MG/24HR PTTW: 0.05 | 84 days supply | Qty: 24 | Fill #0

## 2018-11-20 ENCOUNTER — Encounter: Payer: 59 | Admitting: Physical Therapy

## 2018-11-21 ENCOUNTER — Ambulatory Visit: Payer: 59 | Admitting: Physical Therapy

## 2018-11-21 ENCOUNTER — Encounter: Payer: Self-pay | Admitting: Physical Therapy

## 2018-11-21 ENCOUNTER — Other Ambulatory Visit: Payer: Self-pay

## 2018-11-21 DIAGNOSIS — M24112 Other articular cartilage disorders, left shoulder: Secondary | ICD-10-CM | POA: Diagnosis not present

## 2018-11-21 DIAGNOSIS — M25512 Pain in left shoulder: Secondary | ICD-10-CM

## 2018-11-21 DIAGNOSIS — G8929 Other chronic pain: Secondary | ICD-10-CM

## 2018-11-21 DIAGNOSIS — M6281 Muscle weakness (generalized): Secondary | ICD-10-CM

## 2018-11-21 DIAGNOSIS — M25612 Stiffness of left shoulder, not elsewhere classified: Secondary | ICD-10-CM

## 2018-11-21 DIAGNOSIS — M25511 Pain in right shoulder: Secondary | ICD-10-CM | POA: Diagnosis not present

## 2018-11-21 NOTE — Therapy (Signed)
[place], [place], [address] Phone: [phone]   Fax:  [phone]  Physical Therapy Treatment  Patient Details  Name: Katherine Vaughan MRN: [id number] Date of Birth: 02-18-1961 Referring Provider (PT): Dr. Edmonia Lynch   Encounter Date: 11/21/2018  PT End of Session - 11/21/18 1104    Visit Number  7    Number of Visits  16    Date for PT Re-Evaluation  12/26/18    PT Start Time  1105    PT Stop Time  1155    PT Time Calculation (min)  50 min    Activity Tolerance  Patient tolerated treatment well    Behavior During Therapy  [facility] for tasks assessed/performed       Past Medical History:  Diagnosis Date  . Arthritis   . GERD (gastroesophageal reflux disease)   . History of colon polyps 2006   adenomatous, no polyps in 2011  . History of hiatal hernia   . History of kidney stones   . Low blood pressure    occ.  . Migraines    decreased since menopause  . Osteopenia    dexa 08/2010: -2.3 L fem  . PONV (postoperative nausea and vomiting)    only with Demerol  . Skin cancer    Middle of chest  . Urine incontinence     Past Surgical History:  Procedure Laterality Date  . ABDOMINAL HYSTERECTOMY  2006  . BREAST EXCISIONAL BIOPSY Bilateral   . BREAST SURGERY     breast biopsy x's 2 (L) 92 and (R) 98  . COLONOSCOPY    . COLONOSCOPY WITH PROPOFOL N/A 02/11/2015   Procedure: COLONOSCOPY WITH PROPOFOL;  Surgeon: Clarene Essex, MD;  Location[facility] ENDOSCOPY;  Service: Endoscopy;  Laterality: N/A;  . ESOPHAGOGASTRODUODENOSCOPY N/A 02/11/2015   Procedure: ESOPHAGOGASTRODUODENOSCOPY (EGD);  Surgeon: Clarene Essex, MD;  Location: [facility] ENDOSCOPY;  Service: Endoscopy;  Laterality: N/A;  . Laser endometriosis    . SEPTOPLASTY    . SHOULDER ARTHROSCOPY WITH BICEPS TENDON REPAIR Left 10/14/2018   Procedure: SHOULDER ARTHROSCOPY BICEP TENODESIS, SHOULDER DECOMPRESSION  AND SHOULDER MANIPULATION;  Surgeon: Renette Butters, MD;  Location: [place];  Service: Orthopedics;  Laterality: Left;  REGIONAL BLOCK  . TUBAL LIGATION      There were no vitals filed for this visit.  Subjective Assessment - 11/21/18 1106    Subjective  "I am still feeling achey in the spot that I am bruised"    Patient Stated Goals  to decrease pain, return to normal activity no issues or limitation    Currently in Pain?  Yes    Pain Score  2     Pain Orientation  Left    Pain Descriptors / Indicators  Aching    Pain Type  Chronic pain                       [facility] Adult PT Treatment/Exercise - 11/21/18 0001      Shoulder Exercises: Supine   Protraction  Strengthening;Both;15 reps   bil using dowel rod and tactile cues for proper form   Other Supine Exercises  wand flexion working into end ranges 2 x 10      Shoulder Exercises: Sidelying   ABduction  Strengthening;Left;10 reps   x 2 sets     Shoulder Exercises: Isometric Strengthening   Flexion  5X10"    Extension  5X10"    External  Rotation  5X10"    Internal Rotation  5X10"      Cryotherapy   Number Minutes Cryotherapy  10 Minutes    Cryotherapy Location  Shoulder    Type of Cryotherapy  Ice pack   supine     Manual Therapy   Joint Mobilization  GR II inferior glides, PA/ AP, AP with gradual cross body adduction    Passive ROM  all planes to tolerance             PT Education - 11/21/18 1148    Education Details  updated HEP for isometric strengthening,    Person(s) Educated  Patient    Methods  Explanation;Verbal cues;Handout    Comprehension  Verbalized understanding;Verbal cues required       PT Short Term Goals - 11/11/18 8588      PT SHORT TERM GOAL #1   Title  pt to be I with inital HEP     Status  Achieved      PT SHORT TERM GOAL #2   Title  Pt will utilize heat/ice as needed for post session soreness, pain     Baseline  uses minimally, takes bath usually , knows RICE     Status  Achieved      PT SHORT  TERM GOAL #3   Title  Pt will begin to learn good body awareness and scapular position when performing HEP     Status  Achieved        PT Long Term Goals - 10/31/18 1619      PT LONG TERM GOAL #1   Title  Pt will be able to reach to mid low back with L UE for ADLs, dressing and pain <2/10     Time  8    Period  Weeks    Status  New    Target Date  12/26/18      PT LONG TERM GOAL #2   Title  Pt will increase L shoulder strengtht to >/= 4+/5 in all planes to promote shoulder stability while lifting, carrying items.     Baseline  3+/5 to 4_/5 post surgical     Time  8    Period  Weeks    Status  New    Target Date  12/26/18      PT LONG TERM GOAL #3   Title  Pt will be able to lift arm to 120 deg or more with light weight (<5 lbs)  for functional use in kitchen and at work     Time  8    Period  Weeks    Status  New    Target Date  12/26/18      PT LONG TERM GOAL #4   Title  increase FOTO score to predicted level or less limited to demo improvement in function     Time  8    Period  Weeks    Status  New    Target Date  12/26/18      PT LONG TERM GOAL #5   Title  pt to be I with all HEP given as of last visit to maintain and progress current level of function     Time  8    Period  Weeks    Status  New    Target Date  12/26/18            Plan - 11/21/18 1148    Clinical Impression Statement  pt reports seeing her  MD the other day and states she is progressing appropriately. continued working on ROM and scapular strengthening, added isometrics to HEP which she did report soreness with IR/ER but was and to complete the exercise. continued ice end of session.    PT Treatment/Interventions  ADLs/Self Care Home Management;Cryotherapy;Electrical Stimulation;Iontophoresis 4mg /ml Dexamethasone;Moist Heat;Ultrasound;Therapeutic activities;Therapeutic exercise;Patient/family education;Dry needling;Passive range of motion;Taping;Manual techniques;Vasopneumatic  Device;Neuromuscular re-education;Scar mobilization    PT Next Visit Plan  cont AAROM, , manual , progress band exercises as tolerated.    PT Home Exercise Plan  supine cane, isometrics        Patient will benefit from skilled therapeutic intervention in order to improve the following deficits and impairments:  Pain, Impaired UE functional use, Increased fascial restricitons, Decreased strength, Decreased endurance, Decreased activity tolerance, Improper body mechanics, Postural dysfunction, Decreased range of motion, Decreased mobility  Visit Diagnosis: 1. Muscle weakness (generalized)   2. Stiffness of left shoulder, not elsewhere classified   3. Acute pain of left shoulder   4. Chronic left shoulder pain        Problem List Patient Active Problem List   Diagnosis Date Noted  . Arm fatigue 07/15/2017  . Thumb pain, left 05/02/2017  . Anxiety 08/13/2016  . Acne 08/01/2015  . Family history of malignant neoplasm of breast 01/04/2014  . Family history of malignant neoplasm of ovary 01/04/2014  . Vitamin D deficiency disease 07/04/2011  . Osteopenia   . GERD (gastroesophageal reflux disease)    Starr Lake PT, DPT, LAT, ATC  11/21/18  11:52 AM      [facility] [address] [place], [place], [address] Phone: [phone]   Fax:  [phone]  Name: Katherine Vaughan MRN: [id number] Date of Birth: 1961/06/02

## 2018-11-25 ENCOUNTER — Other Ambulatory Visit: Payer: Self-pay

## 2018-11-25 ENCOUNTER — Encounter: Payer: Self-pay | Admitting: Physical Therapy

## 2018-11-25 ENCOUNTER — Ambulatory Visit: Payer: 59 | Admitting: Physical Therapy

## 2018-11-25 DIAGNOSIS — M6281 Muscle weakness (generalized): Secondary | ICD-10-CM

## 2018-11-25 DIAGNOSIS — M25512 Pain in left shoulder: Secondary | ICD-10-CM | POA: Diagnosis not present

## 2018-11-25 DIAGNOSIS — M25612 Stiffness of left shoulder, not elsewhere classified: Secondary | ICD-10-CM | POA: Diagnosis not present

## 2018-11-25 DIAGNOSIS — G8929 Other chronic pain: Secondary | ICD-10-CM

## 2018-11-25 NOTE — Therapy (Signed)
[place], [place], [address] Phone: [phone]   Fax:  [phone]  Physical Therapy Treatment  Patient Details  Name: Katherine Vaughan MRN: [id number] Date of Birth: [birth year] Referring Provider (PT): Dr. Edmonia Lynch   Encounter Date: 11/25/2018  PT End of Session - 11/25/18 0911    Visit Number  8    Number of Visits  16    Date for PT Re-Evaluation  12/26/18    PT Start Time  0905    PT Stop Time  1000    PT Time Calculation (min)  55 min    Activity Tolerance  Patient tolerated treatment well    Behavior During Therapy  [facility] for tasks assessed/performed       Past Medical History:  Diagnosis Date  . Arthritis   . GERD (gastroesophageal reflux disease)   . History of colon polyps 2006   adenomatous, no polyps in 2011  . History of hiatal hernia   . History of kidney stones   . Low blood pressure    occ.  . Migraines    decreased since menopause  . Osteopenia    dexa 08/2010: -2.3 L fem  . PONV (postoperative nausea and vomiting)    only with Demerol  . Skin cancer    Middle of chest  . Urine incontinence     Past Surgical History:  Procedure Laterality Date  . ABDOMINAL HYSTERECTOMY  2006  . BREAST EXCISIONAL BIOPSY Bilateral   . BREAST SURGERY     breast biopsy x's 2 (L) 92 and (R) 98  . COLONOSCOPY    . COLONOSCOPY WITH PROPOFOL N/A 02/11/2015   Procedure: COLONOSCOPY WITH PROPOFOL;  Surgeon: Clarene Essex, MD;  Location: [facility] ENDOSCOPY;  Service: Endoscopy;  Laterality: N/A;  . ESOPHAGOGASTRODUODENOSCOPY N/A 02/11/2015   Procedure: ESOPHAGOGASTRODUODENOSCOPY (EGD);  Surgeon: Clarene Essex, MD;  Location: [facility] ENDOSCOPY;  Service: Endoscopy;  Laterality: N/A;  . Laser endometriosis    . SEPTOPLASTY    . SHOULDER ARTHROSCOPY WITH BICEPS TENDON REPAIR Left 10/14/2018   Procedure: SHOULDER ARTHROSCOPY BICEP TENODESIS, SHOULDER DECOMPRESSION  AND SHOULDER MANIPULATION;  Surgeon: Renette Butters, MD;  Location: [place];  Service: Orthopedics;  Laterality: Left;  REGIONAL BLOCK  . TUBAL LIGATION      There were no vitals filed for this visit.  Subjective Assessment - 11/25/18 0908    Subjective  Saw the PA and I may begin to get treated for my Rt shoulder. Back to work July 8th.    Currently in Pain?  No/denies         [facility] PT Assessment - 11/25/18 0001      Observation/Other Assessments   Focus on Therapeutic Outcomes (FOTO)   38%         [facility] Adult PT Treatment/Exercise - 11/25/18 0001      Shoulder Exercises: Supine   External Rotation Weight (lbs)  isometric with towel pull ER/IR x 10     Shoulder Flexion Weight (lbs)  x 10 with towel pull outward       Shoulder Exercises: Standing   External Rotation  Strengthening;Left;15 reps    Theraband Level (Shoulder External Rotation)  Level 1 (Yellow)    Internal Rotation  Strengthening;Left;15 reps    Theraband Level (Shoulder Internal Rotation)  Level 1 (Yellow)    ABduction  Strengthening;Left;10 reps      Shoulder Exercises: Pulleys   Flexion  2 minutes  Scaption  2 minutes    Other Pulley Exercises  internal rotation 1 min, pain iniitially       Shoulder Exercises: ROM/Strengthening   Wall Wash  x 10 with towel, weightshifting       Shoulder Exercises: Isometric Strengthening   Flexion  5X10"    Extension  5X10"      Cryotherapy   Number Minutes Cryotherapy  10 Minutes    Cryotherapy Location  Shoulder    Type of Cryotherapy  Ice pack      Manual Therapy   Passive ROM  all planes to tolerance   pain with external rotation, internal rotation               PT Short Term Goals - 11/11/18 0277      PT SHORT TERM GOAL #1   Title  pt to be I with inital HEP     Status  Achieved      PT SHORT TERM GOAL #2   Title  Pt will utilize heat/ice as needed for post session soreness, pain     Baseline  uses minimally, takes bath usually , knows RICE     Status  Achieved       PT SHORT TERM GOAL #3   Title  Pt will begin to learn good body awareness and scapular position when performing HEP     Status  Achieved        PT Long Term Goals - 11/25/18 0942      PT LONG TERM GOAL #1   Title  Pt will be able to reach to mid low back with L UE for ADLs, dressing and pain <2/10     Status  On-going      PT LONG TERM GOAL #2   Title  Pt will increase L shoulder strengtht to >/= 4+/5 in all planes to promote shoulder stability while lifting, carrying items.     Status  On-going      PT LONG TERM GOAL #3   Title  Pt will be able to lift arm to 120 deg or more with light weight (<5 lbs)  for functional use in kitchen and at work     Status  Unable to assess      PT LONG TERM GOAL #4   Title  increase FOTO score to predicted level or less limited to demo improvement in function     Status  On-going      PT LONG TERM GOAL #5   Title  pt to be I with all HEP given as of last visit to maintain and progress current level of function     Status  On-going            Plan - 11/25/18 0916    Clinical Impression Statement  Patient continues to improve, has less pain at rest and can use her LUE for dressing and ADLs, light housework with mild pain.    PT Treatment/Interventions  ADLs/Self Care Home Management;Cryotherapy;Electrical Stimulation;Iontophoresis 4mg /ml Dexamethasone;Moist Heat;Ultrasound;Therapeutic activities;Therapeutic exercise;Patient/family education;Dry needling;Passive range of motion;Taping;Manual techniques;Vasopneumatic Device;Neuromuscular re-education;Scar mobilization    PT Next Visit Plan  cont gentle strengthening, light manual and ROM to tolerance    PT Home Exercise Plan  supine cane, isometrics     Consulted and Agree with Plan of Care  Patient       Patient will benefit from skilled therapeutic intervention in order to improve the following deficits and impairments:  Pain, Impaired UE functional  use, Increased fascial restricitons,  Decreased strength, Decreased endurance, Decreased activity tolerance, Improper body mechanics, Postural dysfunction, Decreased range of motion, Decreased mobility  Visit Diagnosis: 1. Muscle weakness (generalized)   2. Stiffness of left shoulder, not elsewhere classified   3. Acute pain of left shoulder   4. Chronic left shoulder pain        Problem List Patient Active Problem List   Diagnosis Date Noted  . Arm fatigue 07/15/2017  . Thumb pain, left 05/02/2017  . Anxiety 08/13/2016  . Acne 08/01/2015  . Family history of malignant neoplasm of breast 01/04/2014  . Family history of malignant neoplasm of ovary 01/04/2014  . Vitamin D deficiency disease 07/04/2011  . Osteopenia   . GERD (gastroesophageal reflux disease)     Sherae Santino 11/25/2018, 10:01 AM  [place], [place], [address] Phone: [phone]   Fax:  [phone]  Name: Katherine Vaughan MRN: [id number] Date of Birth: Apr 22, 1961  Raeford Razor, PT 11/25/18 10:01 AM Phone: [phone] Fax: [phone]

## 2018-11-27 ENCOUNTER — Other Ambulatory Visit: Payer: Self-pay

## 2018-11-27 ENCOUNTER — Ambulatory Visit: Payer: 59 | Admitting: Physical Therapy

## 2018-11-27 ENCOUNTER — Encounter: Payer: Self-pay | Admitting: Physical Therapy

## 2018-11-27 DIAGNOSIS — M25612 Stiffness of left shoulder, not elsewhere classified: Secondary | ICD-10-CM | POA: Diagnosis not present

## 2018-11-27 DIAGNOSIS — M6281 Muscle weakness (generalized): Secondary | ICD-10-CM | POA: Diagnosis not present

## 2018-11-27 DIAGNOSIS — M25512 Pain in left shoulder: Secondary | ICD-10-CM

## 2018-11-27 DIAGNOSIS — G8929 Other chronic pain: Secondary | ICD-10-CM | POA: Diagnosis not present

## 2018-11-27 NOTE — Therapy (Signed)
[place], [place], [address] Phone: [phone]   Fax:  [phone]  Physical Therapy Treatment  Patient Details  Name: Katherine Vaughan MRN: [id number] Date of Birth: [birth year] Referring Provider (PT): Dr. Edmonia Lynch   Encounter Date: 11/27/2018  PT End of Session - 11/27/18 0908    Visit Number  9    Number of Visits  16    Date for PT Re-Evaluation  12/26/18    PT Start Time  0908   pt arrived 8 min late   PT Stop Time  0948    PT Time Calculation (min)  40 min    Activity Tolerance  Patient tolerated treatment well    Behavior During Therapy  [facility] for tasks assessed/performed       Past Medical History:  Diagnosis Date  . Arthritis   . GERD (gastroesophageal reflux disease)   . History of colon polyps 2006   adenomatous, no polyps in 2011  . History of hiatal hernia   . History of kidney stones   . Low blood pressure    occ.  . Migraines    decreased since menopause  . Osteopenia    dexa 08/2010: -2.3 L fem  . PONV (postoperative nausea and vomiting)    only with Demerol  . Skin cancer    Middle of chest  . Urine incontinence     Past Surgical History:  Procedure Laterality Date  . ABDOMINAL HYSTERECTOMY  2006  . BREAST EXCISIONAL BIOPSY Bilateral   . BREAST SURGERY     breast biopsy x's 2 (L) 92 and (R) 98  . COLONOSCOPY    . COLONOSCOPY WITH PROPOFOL N/A 02/11/2015   Procedure: COLONOSCOPY WITH PROPOFOL;  Surgeon: Clarene Essex, MD;  Location: [facility] ENDOSCOPY;  Service: Endoscopy;  Laterality: N/A;  . ESOPHAGOGASTRODUODENOSCOPY N/A 02/11/2015   Procedure: ESOPHAGOGASTRODUODENOSCOPY (EGD);  Surgeon: Clarene Essex, MD;  Location: [facility] ENDOSCOPY;  Service: Endoscopy;  Laterality: N/A;  . Laser endometriosis    . SEPTOPLASTY    . SHOULDER ARTHROSCOPY WITH BICEPS TENDON REPAIR Left 10/14/2018   Procedure: SHOULDER ARTHROSCOPY BICEP TENODESIS, SHOULDER DECOMPRESSION  AND SHOULDER MANIPULATION;   Surgeon: Renette Butters, MD;  Location: [place];  Service: Orthopedics;  Laterality: Left;  REGIONAL BLOCK  . TUBAL LIGATION      There were no vitals filed for this visit.  Subjective Assessment - 11/27/18 0910    Subjective  "I am alittle stressed today, I am still feeling soreness in that one spot in the arm"    Patient Stated Goals  to decrease pain, return to normal activity no issues or limitation    Currently in Pain?  Yes    Pain Score  0-No pain    Aggravating Factors   using, reaching                       [facility] Adult PT Treatment/Exercise - 11/27/18 0001      Elbow Exercises   Elbow Flexion  20 reps   bil with dowel rod     Shoulder Exercises: Supine   Protraction  20 reps   bil UE with dowel rod   Horizontal ABduction  AAROM;Both;20 reps   with dowel rod   Other Supine Exercises  wand flexion working into end ranges 1x 20      Shoulder Exercises: Standing   External Rotation  Strengthening;Left;15 reps    Theraband Level (Shoulder  External Rotation)  Level 2 (Red)    Internal Rotation  Strengthening;Left;15 reps    Theraband Level (Shoulder Internal Rotation)  Level 2 (Red)    Row  Strengthening;Both;15 reps      Shoulder Exercises: Stretch   Other Shoulder Stretches  bicep 3 way stretch holding 30 sec   cues to avoid pain just focus on stretch     Manual Therapy   Manual therapy comments  MTPR along distal bicep brachii x 3    Joint Mobilization  GR III inferior glides, PA/ AP, AP with gradual cross body adduction    Soft tissue mobilization  IASTM along bicep brachii and middle deltoid             PT Education - 11/27/18 0948    Education Details  reviewed rotator cuff strengthening and proper theraband progression.    Person(s) Educated  Patient    Methods  Explanation;Verbal cues    Comprehension  Verbalized understanding;Verbal cues required       PT Short Term Goals - 11/11/18 3299      PT SHORT TERM  GOAL #1   Title  pt to be I with inital HEP     Status  Achieved      PT SHORT TERM GOAL #2   Title  Pt will utilize heat/ice as needed for post session soreness, pain     Baseline  uses minimally, takes bath usually , knows RICE     Status  Achieved      PT SHORT TERM GOAL #3   Title  Pt will begin to learn good body awareness and scapular position when performing HEP     Status  Achieved        PT Long Term Goals - 11/25/18 0942      PT LONG TERM GOAL #1   Title  Pt will be able to reach to mid low back with L UE for ADLs, dressing and pain <2/10     Status  On-going      PT LONG TERM GOAL #2   Title  Pt will increase L shoulder strengtht to >/= 4+/5 in all planes to promote shoulder stability while lifting, carrying items.     Status  On-going      PT LONG TERM GOAL #3   Title  Pt will be able to lift arm to 120 deg or more with light weight (<5 lbs)  for functional use in kitchen and at work     Status  Unable to assess      PT LONG TERM GOAL #4   Title  increase FOTO score to predicted level or less limited to demo improvement in function     Status  On-going      PT LONG TERM GOAL #5   Title  pt to be I with all HEP given as of last visit to maintain and progress current level of function     Status  On-going            Plan - 11/27/18 0948    Clinical Impression Statement  pt reported doing well today but does get some soreness with activity. Continued GHJ mobs and AAROM. progressed theraband strengthening resistance to the red band, she did well with all exercises and declined modalities today.    PT Next Visit Plan  cont gentle strengthening, light manual and ROM to tolerance    PT Home Exercise Plan  supine cane, isometrics, shoulder IR/ER with  theraband    Consulted and Agree with Plan of Care  Patient       Patient will benefit from skilled therapeutic intervention in order to improve the following deficits and impairments:  Pain, Impaired UE  functional use, Increased fascial restricitons, Decreased strength, Decreased endurance, Decreased activity tolerance, Improper body mechanics, Postural dysfunction, Decreased range of motion, Decreased mobility  Visit Diagnosis: 1. Muscle weakness (generalized)   2. Stiffness of left shoulder, not elsewhere classified   3. Acute pain of left shoulder        Problem List Patient Active Problem List   Diagnosis Date Noted  . Arm fatigue 07/15/2017  . Thumb pain, left 05/02/2017  . Anxiety 08/13/2016  . Acne 08/01/2015  . Family history of malignant neoplasm of breast 01/04/2014  . Family history of malignant neoplasm of ovary 01/04/2014  . Vitamin D deficiency disease 07/04/2011  . Osteopenia   . GERD (gastroesophageal reflux disease)    Starr Lake PT, DPT, LAT, ATC  11/27/18  9:51 AM      [facility] [address] [place], [place], [address] Phone: [phone]   Fax:  [phone]  Name: KYANN HEYDT MRN: [id number] Date of Birth: 1960/09/29

## 2018-11-28 MED FILL — ESOMEPRAZOLE MAG DR 40 MG C: 40 | 30 days supply | Qty: 30 | Fill #1

## 2018-12-01 ENCOUNTER — Ambulatory Visit: Payer: 59 | Admitting: Physical Therapy

## 2018-12-01 ENCOUNTER — Other Ambulatory Visit: Payer: Self-pay

## 2018-12-01 DIAGNOSIS — M25612 Stiffness of left shoulder, not elsewhere classified: Secondary | ICD-10-CM | POA: Diagnosis not present

## 2018-12-01 DIAGNOSIS — G8929 Other chronic pain: Secondary | ICD-10-CM | POA: Diagnosis not present

## 2018-12-01 DIAGNOSIS — M25512 Pain in left shoulder: Secondary | ICD-10-CM | POA: Diagnosis not present

## 2018-12-01 DIAGNOSIS — M6281 Muscle weakness (generalized): Secondary | ICD-10-CM | POA: Diagnosis not present

## 2018-12-02 NOTE — Therapy (Signed)
[place], [place], [address] Phone: [phone]   Fax:  [phone]  Physical Therapy Treatment  Patient Details  Name: Katherine Vaughan MRN: [id number] Date of Birth: [birth year] Referring Provider (PT): Dr. Edmonia Lynch   Encounter Date: 12/01/2018  PT End of Session - 12/01/18 0840    Visit Number  10    Number of Visits  16    Date for PT Re-Evaluation  12/26/18    PT Start Time  1700    PT Stop Time  1750   last 10 min on ice   PT Time Calculation (min)  50 min    Activity Tolerance  Patient tolerated treatment well    Behavior During Therapy  [facility] for tasks assessed/performed       Past Medical History:  Diagnosis Date  . Arthritis   . GERD (gastroesophageal reflux disease)   . History of colon polyps 2006   adenomatous, no polyps in 2011  . History of hiatal hernia   . History of kidney stones   . Low blood pressure    occ.  . Migraines    decreased since menopause  . Osteopenia    dexa 08/2010: -2.3 L fem  . PONV (postoperative nausea and vomiting)    only with Demerol  . Skin cancer    Middle of chest  . Urine incontinence     Past Surgical History:  Procedure Laterality Date  . ABDOMINAL HYSTERECTOMY  2006  . BREAST EXCISIONAL BIOPSY Bilateral   . BREAST SURGERY     breast biopsy x's 2 (L) 92 and (R) 98  . COLONOSCOPY    . COLONOSCOPY WITH PROPOFOL N/A 02/11/2015   Procedure: COLONOSCOPY WITH PROPOFOL;  Surgeon: Clarene Essex, MD;  Location: [facility] ENDOSCOPY;  Service: Endoscopy;  Laterality: N/A;  . ESOPHAGOGASTRODUODENOSCOPY N/A 02/11/2015   Procedure: ESOPHAGOGASTRODUODENOSCOPY (EGD);  Surgeon: Clarene Essex, MD;  Location: [facility] ENDOSCOPY;  Service: Endoscopy;  Laterality: N/A;  . Laser endometriosis    . SEPTOPLASTY    . SHOULDER ARTHROSCOPY WITH BICEPS TENDON REPAIR Left 10/14/2018   Procedure: SHOULDER ARTHROSCOPY BICEP TENODESIS, SHOULDER DECOMPRESSION  AND SHOULDER MANIPULATION;   Surgeon: Renette Butters, MD;  Location: [place];  Service: Orthopedics;  Laterality: Left;  REGIONAL BLOCK  . TUBAL LIGATION      There were no vitals filed for this visit.  Subjective Assessment - 12/01/18 0839    Subjective  I am a little discouraged about my progress, no pain unless I reach overhead    Currently in Pain?  No/denies                       [facility] Adult PT Treatment/Exercise - 12/02/18 0001      Elbow Exercises   Elbow Flexion  Left    Theraband Level (Elbow Flexion)  Level 2 (Red)      Shoulder Exercises: Supine   Protraction  20 reps    Protraction Weight (lbs)  2    Other Supine Exercises  supine scapular stabilization of [place] at 90 deg flexion arm extended      Shoulder Exercises: Standing   Extension  Strengthening;Both;20 reps    Theraband Level (Shoulder Extension)  Level 2 (Red)    Row  Strengthening;Both;20 reps    Theraband Level (Shoulder Row)  Level 2 (Red)      Shoulder Exercises: Pulleys   Flexion  2 minutes    ABduction  2 minutes      Shoulder Exercises: ROM/Strengthening   [place]  15 reps flexion, scaption, circles CW, CCW      Shoulder Exercises: Stretch   Other Shoulder Stretches  post capsule stretch 10 sec X 5    Other Shoulder Stretches  bicep 3 way stretch holding 30 sec      Cryotherapy   Number Minutes Cryotherapy  10 Minutes    Cryotherapy Location  Shoulder    Type of Cryotherapy  Ice pack      Manual Therapy   Manual therapy comments  STM and CFM to biceps tendon and pec, PROM and GH mobs gently to tolerance               PT Short Term Goals - 11/11/18 8250      PT SHORT TERM GOAL #1   Title  pt to be I with inital HEP     Status  Achieved      PT SHORT TERM GOAL #2   Title  Pt will utilize heat/ice as needed for post session soreness, pain     Baseline  uses minimally, takes bath usually , knows RICE     Status  Achieved      PT SHORT TERM GOAL #3   Title  Pt  will begin to learn good body awareness and scapular position when performing HEP     Status  Achieved        PT Long Term Goals - 11/25/18 0942      PT LONG TERM GOAL #1   Title  Pt will be able to reach to mid low back with L UE for ADLs, dressing and pain <2/10     Status  On-going      PT LONG TERM GOAL #2   Title  Pt will increase L shoulder strengtht to >/= 4+/5 in all planes to promote shoulder stability while lifting, carrying items.     Status  On-going      PT LONG TERM GOAL #3   Title  Pt will be able to lift arm to 120 deg or more with light weight (<5 lbs)  for functional use in kitchen and at work     Status  Unable to assess      PT LONG TERM GOAL #4   Title  increase FOTO score to predicted level or less limited to demo improvement in function     Status  On-going      PT LONG TERM GOAL #5   Title  pt to be I with all HEP given as of last visit to maintain and progress current level of function     Status  On-going            Plan - 12/01/18 0846    Clinical Impression Statement  Pt feels discouraged about her progress however she is only 6 weeks out and she was educated on normal heeling times and rehab times 6 to 12 weeks after her surgey, and the she is actually progressing quite well and as expected. She did however have good tolerance to strengthening, ROM, and scapular stabilization exercises today. Continue POC.    PT Next Visit Plan  cont gentle strengthening, light manual and ROM to tolerance    PT Home Exercise Plan  supine cane, isometrics, shoulder IR/ER with theraband    Consulted and Agree with Plan of Care  Patient       Patient will benefit from skilled  therapeutic intervention in order to improve the following deficits and impairments:  Pain, Impaired UE functional use, Increased fascial restricitons, Decreased strength, Decreased endurance, Decreased activity tolerance, Improper body mechanics, Postural dysfunction, Decreased range of  motion, Decreased mobility  Visit Diagnosis: 1. Muscle weakness (generalized)   2. Stiffness of left shoulder, not elsewhere classified   3. Acute pain of left shoulder   4. Chronic left shoulder pain        Problem List Patient Active Problem List   Diagnosis Date Noted  . Arm fatigue 07/15/2017  . Thumb pain, left 05/02/2017  . Anxiety 08/13/2016  . Acne 08/01/2015  . Family history of malignant neoplasm of breast 01/04/2014  . Family history of malignant neoplasm of ovary 01/04/2014  . Vitamin D deficiency disease 07/04/2011  . Osteopenia   . GERD (gastroesophageal reflux disease)     Silvestre Mesi 12/02/2018, 8:56 AM  [facility] [address] [place], [place], [address] Phone: [phone]   Fax:  [phone]  Name: Katherine Vaughan MRN: [id number] Date of Birth: 12/19/1960

## 2018-12-03 ENCOUNTER — Other Ambulatory Visit: Payer: Self-pay

## 2018-12-03 ENCOUNTER — Other Ambulatory Visit: Payer: Self-pay | Admitting: Neurosurgery

## 2018-12-03 ENCOUNTER — Ambulatory Visit: Payer: 59 | Attending: Sports Medicine | Admitting: Physical Therapy

## 2018-12-03 ENCOUNTER — Other Ambulatory Visit: Payer: Self-pay | Admitting: Obstetrics and Gynecology

## 2018-12-03 ENCOUNTER — Encounter: Payer: Self-pay | Admitting: Physical Therapy

## 2018-12-03 DIAGNOSIS — M25612 Stiffness of left shoulder, not elsewhere classified: Secondary | ICD-10-CM | POA: Diagnosis not present

## 2018-12-03 DIAGNOSIS — G8929 Other chronic pain: Secondary | ICD-10-CM | POA: Insufficient documentation

## 2018-12-03 DIAGNOSIS — M6281 Muscle weakness (generalized): Secondary | ICD-10-CM | POA: Insufficient documentation

## 2018-12-03 DIAGNOSIS — M8580 Other specified disorders of bone density and structure, unspecified site: Secondary | ICD-10-CM

## 2018-12-03 DIAGNOSIS — M25512 Pain in left shoulder: Secondary | ICD-10-CM | POA: Diagnosis not present

## 2018-12-03 IMAGING — MR MR BILATERAL BREAST WITHOUT AND WITH CONTRAST
6 of 13 series · 20 of 48 positions shown · IV contrast (multihance)
Comparison: Previous exam(s).

CLINICAL DATA: Strong family history of breast cancer.

LABS:  None available
EXAM:
BILATERAL BREAST MRI WITH AND WITHOUT CONTRAST
TECHNIQUE: Multiplanar, multisequence MR images of both breasts were obtained
prior to and following the intravenous administration of 10 ml of
MultiHance.

[Series 1: (phone_number) · axial · 7.0mm · 1.56mm/px · 1 of 25 slices shown]
[im 1/25]
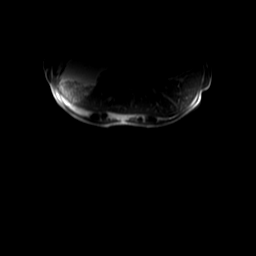

[Series 4: ax ir · axial · 3.0mm · 0.70mm/px · 1 of 64 slices shown]
[im 1/64]
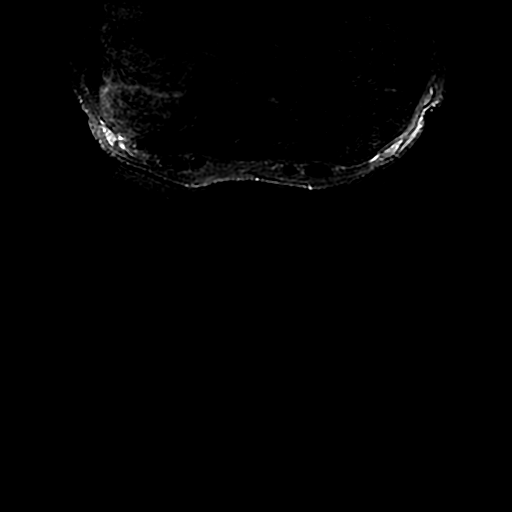

[Series 5: repeat ax ir · axial · 3.0mm · 0.70mm/px · z∈[-99,+90]mm · 2 of 63 slices shown]
[im 1/63]
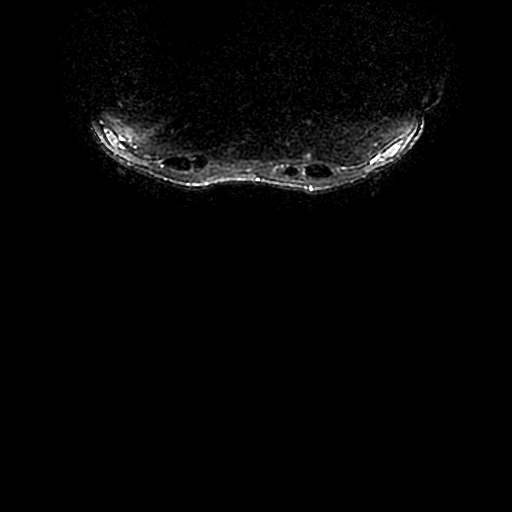
[im 63/63]
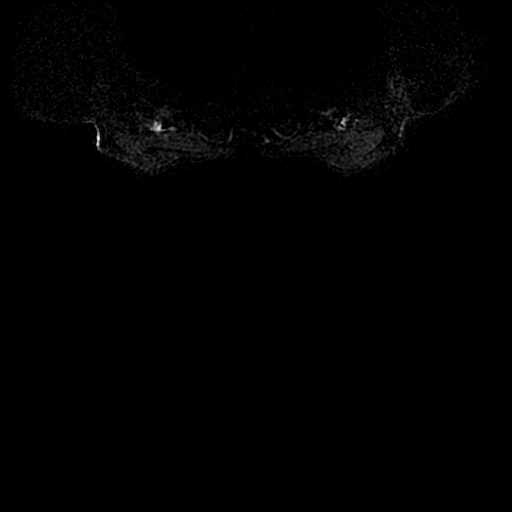

[Series 800: (id) vibrant mph · axial · 1.8mm · 0.59mm/px · z∈[-100,+90]mm · 6 of 212 slices shown]
[im 1/212]
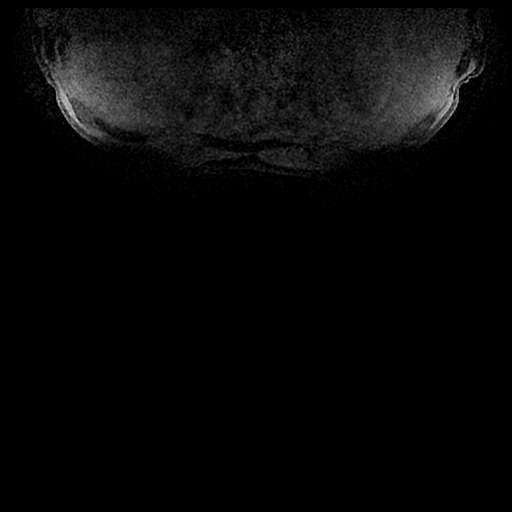
[im 43/212]
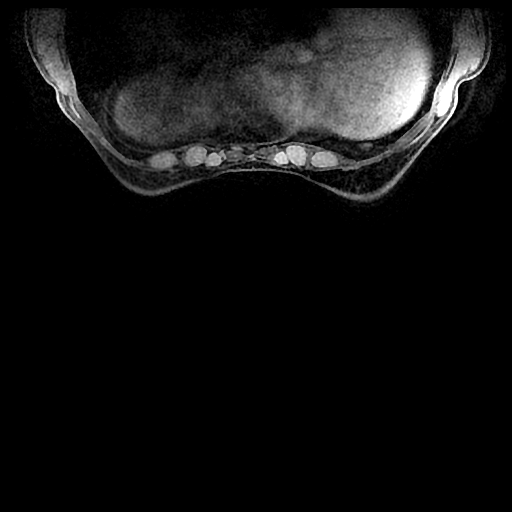
[im 85/212]
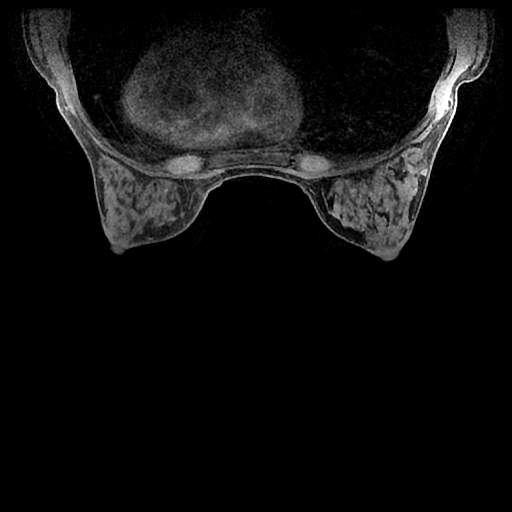
[im 127/212]
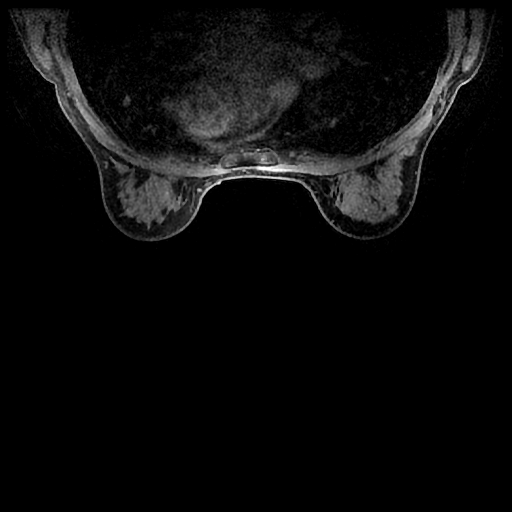
[im 169/212]
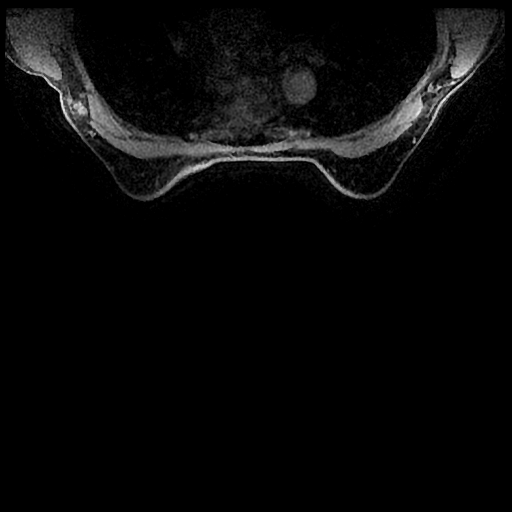
[im 212/212]
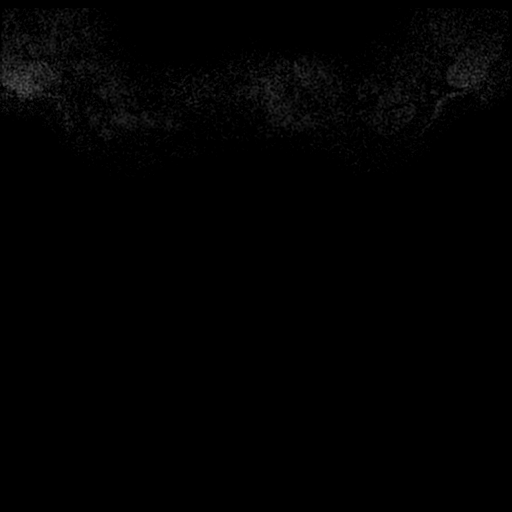

[Series 801: ph1/(id) vibrant mph · axial · 1.8mm · 0.59mm/px · z∈[-100,+90]mm · 6 of 212 slices shown]
[im 1/212]
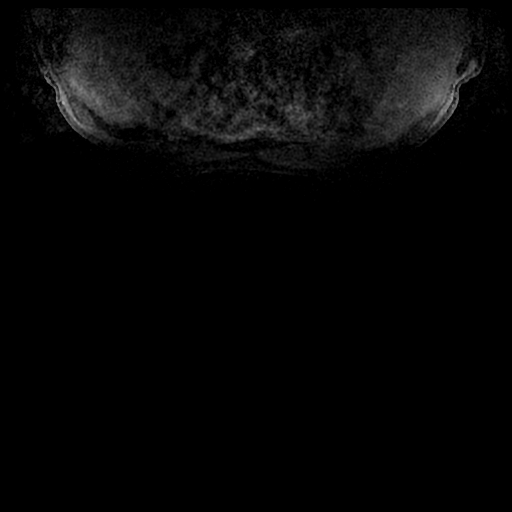
[im 43/212]
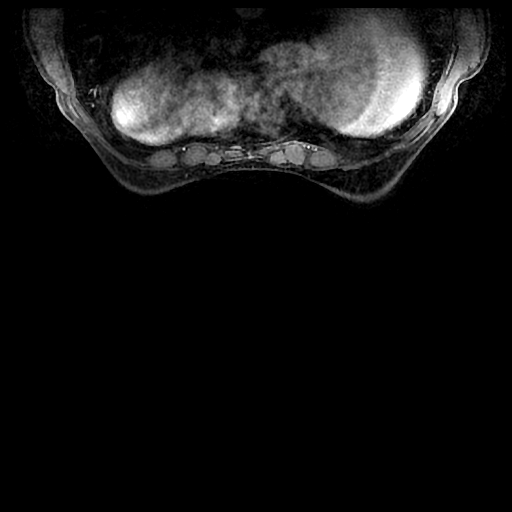
[im 85/212]
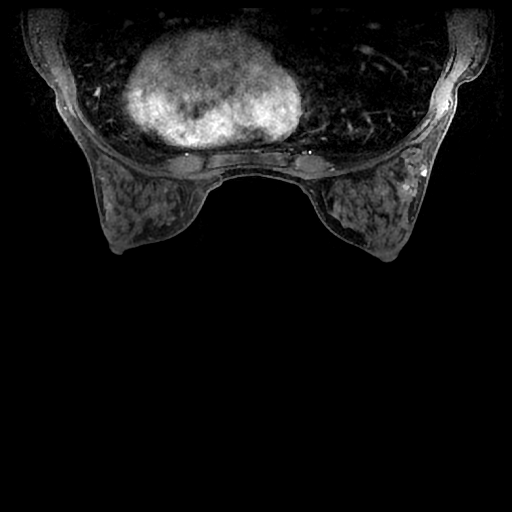
[im 127/212]
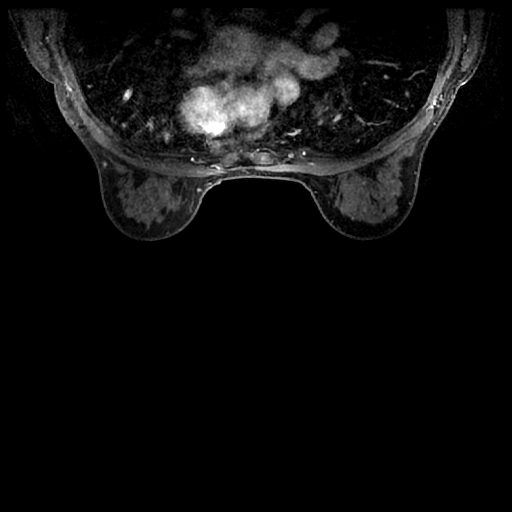
[im 169/212]
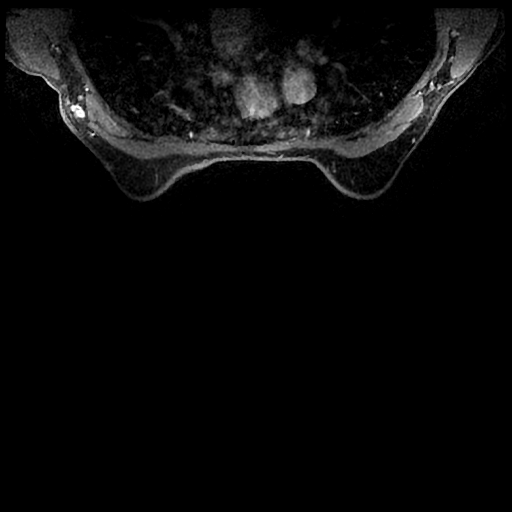
[im 212/212]
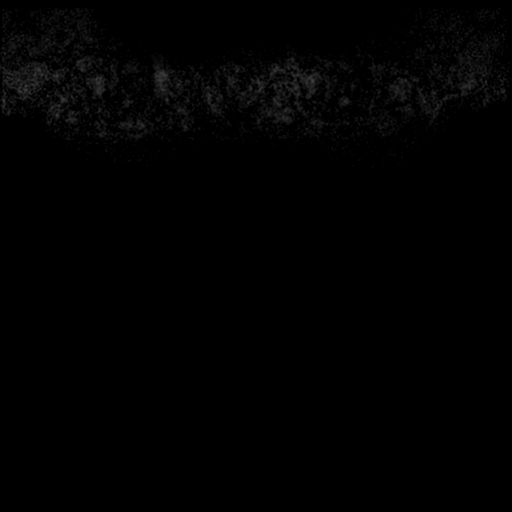

[Series 802: ph2/(id) vibrant mph · axial · 1.8mm · 0.59mm/px · z∈[-100,+13]mm · 4 of 212 slices shown]
[im 1/212]
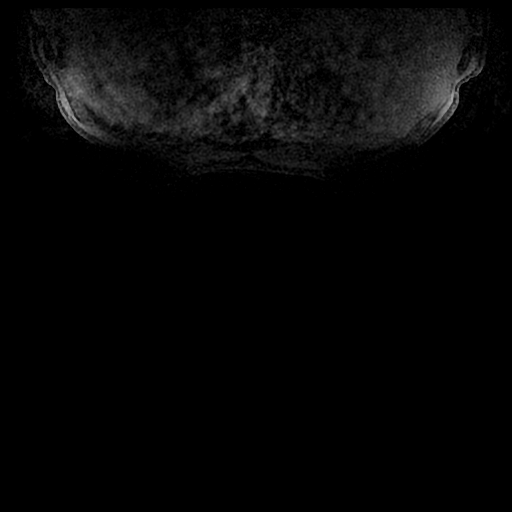
[im 43/212]
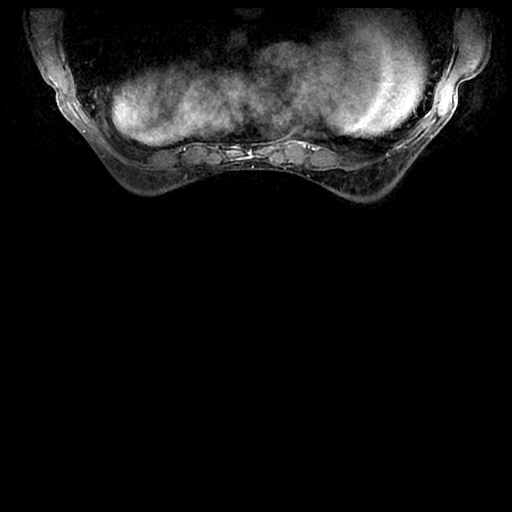
[im 85/212]
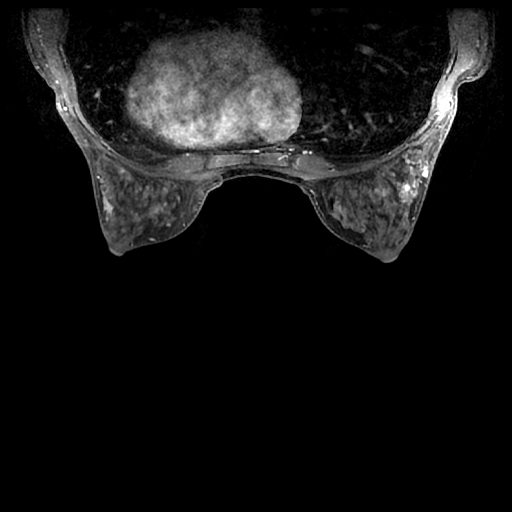
[im 127/212]
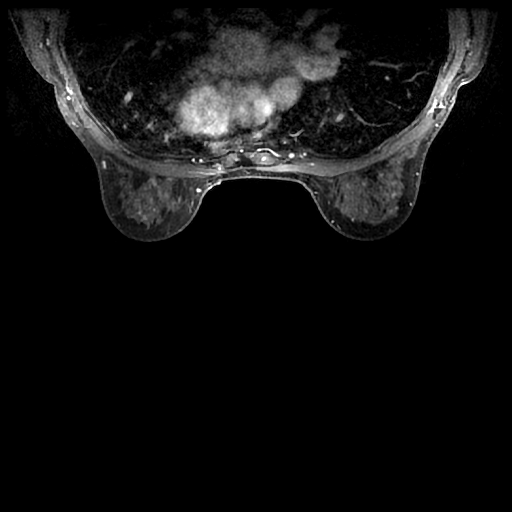

[20 of 48 positions shown; findings below may reference images not displayed]

THREE-DIMENSIONAL MR IMAGE RENDERING ON INDEPENDENT WORKSTATION:

Three-dimensional MR images were rendered by post-processing of the
original MR data on an independent workstation. The
three-dimensional MR images were interpreted, and findings are
reported in the following complete MRI report for this study. Three
dimensional images were evaluated at the independent [employer]
workstation
FINDINGS: Breast composition: c. Heterogeneous fibroglandular tissue.

Background parenchymal enhancement: Moderate.

Right breast: No mass or abnormal enhancement.

Left breast: No mass or abnormal enhancement.

Lymph nodes: No abnormal appearing lymph nodes.

Ancillary findings:  None.
IMPRESSION: No MRI evidence of malignancy.

RECOMMENDATION:
If the patient's lifetime risk of breast cancer is greater than 20%,
recommend annual mammography and annual breast MRI.

BI-RADS CATEGORY  2: Benign.

## 2018-12-03 NOTE — Therapy (Signed)
[place], [place], [address] Phone: [phone]   Fax:  [phone]  Physical Therapy Treatment  Patient Details  Name: Katherine Vaughan MRN: [id number] Date of Birth: [birth year] Referring Provider (PT): Dr. Edmonia Lynch   Encounter Date: 12/03/2018  PT End of Session - 12/03/18 1614    Visit Number  11    Number of Visits  16    Date for PT Re-Evaluation  12/26/18    PT Start Time  0305    PT Stop Time  0355    PT Time Calculation (min)  50 min    Activity Tolerance  Patient tolerated treatment well    Behavior During Therapy  [facility] for tasks assessed/performed       Past Medical History:  Diagnosis Date  . Arthritis   . GERD (gastroesophageal reflux disease)   . History of colon polyps 2006   adenomatous, no polyps in 2011  . History of hiatal hernia   . History of kidney stones   . Low blood pressure    occ.  . Migraines    decreased since menopause  . Osteopenia    dexa 08/2010: -2.3 L fem  . PONV (postoperative nausea and vomiting)    only with Demerol  . Skin cancer    Middle of chest  . Urine incontinence     Past Surgical History:  Procedure Laterality Date  . ABDOMINAL HYSTERECTOMY  2006  . BREAST EXCISIONAL BIOPSY Bilateral   . BREAST SURGERY     breast biopsy x's 2 (L) 92 and (R) 98  . COLONOSCOPY    . COLONOSCOPY WITH PROPOFOL N/A 02/11/2015   Procedure: COLONOSCOPY WITH PROPOFOL;  Surgeon: Clarene Essex, MD;  Location[facility] ENDOSCOPY;  Service: Endoscopy;  Laterality: N/A;  . ESOPHAGOGASTRODUODENOSCOPY N/A 02/11/2015   Procedure: ESOPHAGOGASTRODUODENOSCOPY (EGD);  Surgeon: Clarene Essex, MD;  Location: [facility] ENDOSCOPY;  Service: Endoscopy;  Laterality: N/A;  . Laser endometriosis    . SEPTOPLASTY    . SHOULDER ARTHROSCOPY WITH BICEPS TENDON REPAIR Left 10/14/2018   Procedure: SHOULDER ARTHROSCOPY BICEP TENODESIS, SHOULDER DECOMPRESSION  AND SHOULDER MANIPULATION;  Surgeon: Renette Butters, MD;  Location: [place];  Service: Orthopedics;  Laterality: Left;  REGIONAL BLOCK  . TUBAL LIGATION      There were no vitals filed for this visit.  Subjective Assessment - 12/03/18 1550    Subjective  My shoulder is doing okay, I cleaned out a closet which got my shoulder going but its okay now    Pertinent History  SHOULDER ARTHROSCOPY BICEP TENODESIS, SHOULDER DECOMPRESSION  AND SHOULDER MANIPULATION , arthritis, osteopenia     Limitations  Lifting;House hold activities    How long can you sit comfortably?  unlimited    How long can you stand comfortably?  unlimited    How long can you walk comfortably?  unlimited    Diagnostic tests  MRI: Negative MR arthrogram of the left shoulder. There is some contrast in the subacromial/subdeltoid bursa which is likely related to injection technique or less likely a focal perforation of the cuff    Patient Stated Goals  to decrease pain, return to normal activity no issues or limitation    Currently in Pain?  No/denies   no pain at rest, still has pain if raises her arm overhead or behind back   Pain Onset  More than a month ago    Multiple Pain Sites  No  [facility] Adult PT Treatment/Exercise - 12/03/18 0001      Elbow Exercises   Elbow Flexion  Left    Theraband Level (Elbow Flexion)  Level 2 (Red)      Shoulder Exercises: Supine   Protraction  20 reps    Protraction Weight (lbs)  2    Other Supine Exercises  supine scapular stabilization of alpahabet at 90 deg flexion arm extended    Other Supine Exercises  D1 and D2 flexion with 1 lb 10 ea      Shoulder Exercises: Standing   Horizontal ABduction  20 reps;Theraband    Theraband Level (Shoulder Horizontal ABduction)  Level 1 (Yellow)    External Rotation  Both;20 reps    Theraband Level (Shoulder External Rotation)  Level 1 (Yellow)    Extension  Strengthening;Both;20 reps    Theraband Level (Shoulder Extension)  Level 2 (Red)     Row  Strengthening;Both;20 reps    Theraband Level (Shoulder Row)  Level 2 (Red)      Shoulder Exercises: Pulleys   Flexion  2 minutes    ABduction  2 minutes      Shoulder Exercises: ROM/Strengthening   Ranger  15 reps flexion, scaption, circles CW, CCW    Ball on Wall  green weighted ball 20 X for cirlces    Other ROM/Strengthening Exercises  --      Shoulder Exercises: Stretch   Other Shoulder Stretches  post capsule stretch 10 sec X 5    Other Shoulder Stretches  bicep 3 way stretch holding 30 sec      Cryotherapy   Number Minutes Cryotherapy  7 Minutes    Cryotherapy Location  Shoulder    Type of Cryotherapy  Ice pack      Manual Therapy   Manual therapy comments  STM and CFM to biceps tendon and pec, PROM and GH mobs gently to tolerance               PT Short Term Goals - 11/11/18 9509      PT SHORT TERM GOAL #1   Title  pt to be I with inital HEP     Status  Achieved      PT SHORT TERM GOAL #2   Title  Pt will utilize heat/ice as needed for post session soreness, pain     Baseline  uses minimally, takes bath usually , knows RICE     Status  Achieved      PT SHORT TERM GOAL #3   Title  Pt will begin to learn good body awareness and scapular position when performing HEP     Status  Achieved        PT Long Term Goals - 11/25/18 0942      PT LONG TERM GOAL #1   Title  Pt will be able to reach to mid low back with L UE for ADLs, dressing and pain <2/10     Status  On-going      PT LONG TERM GOAL #2   Title  Pt will increase L shoulder strengtht to >/= 4+/5 in all planes to promote shoulder stability while lifting, carrying items.     Status  On-going      PT LONG TERM GOAL #3   Title  Pt will be able to lift arm to 120 deg or more with light weight (<5 lbs)  for functional use in kitchen and at work     Status  Unable to assess  PT LONG TERM GOAL #4   Title  increase FOTO score to predicted level or less limited to demo improvement in function      Status  On-going      PT LONG TERM GOAL #5   Title  pt to be I with all HEP given as of last visit to maintain and progress current level of function     Status  On-going            Plan - 12/03/18 1614    Clinical Impression Statement  improvments in activity tolerance with exercises today. ROM and strength are improving slowly as expected. Progressed with scapular stabilization today. Continue POC    PT Next Visit Plan  cont gentle strengthening, light manual and ROM to tolerance    PT Home Exercise Plan  supine cane, isometrics, shoulder IR/ER with theraband    Consulted and Agree with Plan of Care  Patient       Patient will benefit from skilled therapeutic intervention in order to improve the following deficits and impairments:  Pain, Impaired UE functional use, Increased fascial restricitons, Decreased strength, Decreased endurance, Decreased activity tolerance, Improper body mechanics, Postural dysfunction, Decreased range of motion, Decreased mobility  Visit Diagnosis: 1. Muscle weakness (generalized)   2. Stiffness of left shoulder, not elsewhere classified   3. Acute pain of left shoulder   4. Chronic left shoulder pain        Problem List Patient Active Problem List   Diagnosis Date Noted  . Arm fatigue 07/15/2017  . Thumb pain, left 05/02/2017  . Anxiety 08/13/2016  . Acne 08/01/2015  . Family history of malignant neoplasm of breast 01/04/2014  . Family history of malignant neoplasm of ovary 01/04/2014  . Vitamin D deficiency disease 07/04/2011  . Osteopenia   . GERD (gastroesophageal reflux disease)     Silvestre Mesi 12/03/2018, 4:19 PM  [facility] [address] [place], [place], [address] Phone: [phone]   Fax:  [phone]  Name: BETZAYDA BRAXTON MRN: [id number] Date of Birth: 1960-06-24

## 2018-12-07 NOTE — Patient Instructions (Addendum)
Tests ordered today. Your results will be released to [place] (or called to you) after review, usually within 72hours after test completion. If any changes need to be made, you will be notified at that same time.  All other Health Maintenance issues reviewed.   All recommended immunizations and age-appropriate screenings are up-to-date or discussed.  No immunizations administered today.   Medications reviewed and updated.  Changes include :   none   Please followup in one year    Health Maintenance, Female Adopting a healthy lifestyle and getting preventive care are important in promoting health and wellness. Ask your health care provider about:  The right schedule for you to have regular tests and exams.  Things you can do on your own to prevent diseases and keep yourself healthy. What should I know about diet, weight, and exercise? Eat a healthy diet   Eat a diet that includes plenty of vegetables, fruits, low-fat dairy products, and lean protein.  Do not eat a lot of foods that are high in solid fats, added sugars, or sodium. Maintain a healthy weight Body mass index (BMI) is used to identify weight problems. It estimates body fat based on height and weight. Your health care provider can help determine your BMI and help you achieve or maintain a healthy weight. Get regular exercise Get regular exercise. This is one of the most important things you can do for your health. Most adults should:  Exercise for at least 150 minutes each week. The exercise should increase your heart rate and make you sweat (moderate-intensity exercise).  Do strengthening exercises at least twice a week. This is in addition to the moderate-intensity exercise.  Spend less time sitting. Even light physical activity can be beneficial. Watch cholesterol and blood lipids Have your blood tested for lipids and cholesterol at 60 years of age, then have this test every 5 years. Have your cholesterol levels  checked more often if:  Your lipid or cholesterol levels are high.  You are older than 59 years of age.  You are at high risk for heart disease. What should I know about cancer screening? Depending on your health history and family history, you may need to have cancer screening at various ages. This may include screening for:  Breast cancer.  Cervical cancer.  Colorectal cancer.  Skin cancer.  Lung cancer. What should I know about heart disease, diabetes, and high blood pressure? Blood pressure and heart disease  High blood pressure causes heart disease and increases the risk of stroke. This is more likely to develop in people who have high blood pressure readings, are of African descent, or are overweight.  Have your blood pressure checked: ? Every 3-5 years if you are 36-22 years of age. ? Every year if you are 43 years old or older. Diabetes Have regular diabetes screenings. This checks your fasting blood sugar level. Have the screening done:  Once every three years after age 49 if you are at a normal weight and have a low risk for diabetes.  More often and at a younger age if you are overweight or have a high risk for diabetes. What should I know about preventing infection? Hepatitis B If you have a higher risk for hepatitis B, you should be screened for this virus. Talk with your health care provider to find out if you are at risk for hepatitis B infection. Hepatitis C Testing is recommended for:  Everyone born from [birth year] through 1967.  Anyone with known risk  factors for hepatitis C. Sexually transmitted infections (STIs)  Get screened for STIs, including gonorrhea and chlamydia, if: ? You are sexually active and are younger than 60 years of age. ? You are older than 60 years of age and your health care provider tells you that you are at risk for this type of infection. ? Your sexual activity has changed since you were last screened, and you are at increased risk  for chlamydia or gonorrhea. Ask your health care provider if you are at risk.  Ask your health care provider about whether you are at high risk for HIV. Your health care provider may recommend a prescription medicine to help prevent HIV infection. If you choose to take medicine to prevent HIV, you should first get tested for HIV. You should then be tested every 3 months for as long as you are taking the medicine. Pregnancy  If you are about to stop having your period (premenopausal) and you may become pregnant, seek counseling before you get pregnant.  Take 400 to 800 micrograms (mcg) of folic acid every day if you become pregnant.  Ask for birth control (contraception) if you want to prevent pregnancy. Osteoporosis and menopause Osteoporosis is a disease in which the bones lose minerals and strength with aging. This can result in bone fractures. If you are 42 years old or older, or if you are at risk for osteoporosis and fractures, ask your health care provider if you should:  Be screened for bone loss.  Take a calcium or vitamin D supplement to lower your risk of fractures.  Be given hormone replacement therapy (HRT) to treat symptoms of menopause. Follow these instructions at home: Lifestyle  Do not use any products that contain nicotine or tobacco, such as cigarettes, e-cigarettes, and chewing tobacco. If you need help quitting, ask your health care provider.  Do not use street drugs.  Do not share needles.  Ask your health care provider for help if you need support or information about quitting drugs. Alcohol use  Do not drink alcohol if: ? Your health care provider tells you not to drink. ? You are pregnant, may be pregnant, or are planning to become pregnant.  If you drink alcohol: ? Limit how much you use to 0-1 drink a day. ? Limit intake if you are breastfeeding.  Be aware of how much alcohol is in your drink. In the [place] one drink equals one 12 oz bottle of beer (355  mL), one 5 oz glass of wine (148 mL), or one 1 oz glass of hard liquor (44 mL). General instructions  Schedule regular health, dental, and eye exams.  Stay current with your vaccines.  Tell your health care provider if: ? You often feel depressed. ? You have ever been abused or do not feel safe at home. Summary  Adopting a healthy lifestyle and getting preventive care are important in promoting health and wellness.  Follow your health care provider's instructions about healthy diet, exercising, and getting tested or screened for diseases.  Follow your health care provider's instructions on monitoring your cholesterol and blood pressure. This information is not intended to replace advice given to you by your health care provider. Make sure you discuss any questions you have with your health care provider. Document Released: 12/04/2010 Document Revised: 05/14/2018 Document Reviewed: 05/14/2018 Elsevier Patient Education  2020 [employer].

## 2018-12-07 NOTE — Progress Notes (Signed)
Subjective:    Patient ID: Katherine Vaughan, female    DOB: 1960/06/30, 58 y.o.   MRN: [id number]  HPI She is here for a physical exam.   She had left shoulder surgery in May for a biceps tear.  She has increased stress due to not working.    Medications and allergies reviewed with patient and updated if appropriate.  Patient Active Problem List   Diagnosis Date Noted  . Arm fatigue 07/15/2017  . Thumb pain, left 05/02/2017  . Anxiety 08/13/2016  . Acne 08/01/2015  . Family history of malignant neoplasm of breast 01/04/2014  . Family history of malignant neoplasm of ovary 01/04/2014  . Vitamin D deficiency disease 07/04/2011  . Osteopenia   . GERD (gastroesophageal reflux disease)     Current Outpatient Medications on File Prior to Visit  Medication Sig Dispense Refill  . acetaminophen (TYLENOL) 500 MG tablet Take 500 mg by mouth every 6 (six) hours as needed.    Mar[place] Kitchen ampicillin (PRINCIPEN) 500 MG capsule Take 500 mg by mouth every other day.      . Ascorbic Acid (VITAMIN C) 1000 MG tablet Take 1,000 mg by mouth daily.    . Calcium Carbonate-Vitamin D (CALCIUM + D PO) Take 500 mg by mouth 2 (two) times daily.     . Cholecalciferol (VITAMIN D3) 2000 UNITS TABS Take by mouth daily.      Marland Kitchen esomeprazole (NEXIUM) 40 MG capsule Take 1 capsule (40 mg total) by mouth daily. Patient needs office visit for further refills 30 capsule 1  . estradiol (VIVELLE-DOT) 0.05 MG/24HR patch APPLY 1 PATCH TWICE WEEKLY AS DIRECTED  3  . FIBER SELECT GUMMIES PO Take 2 tablets by mouth at bedtime.    Marland Kitchen ibuprofen (ADVIL) 400 MG tablet Take 400 mg by mouth every 6 (six) hours as needed.    . Multiple Vitamin (MULTIVITAMIN) tablet Take 1 tablet by mouth daily.      . Omega-3 Fatty Acids (FISH OIL ULTRA) 1400 MG CAPS Take 1 tablet by mouth daily.    Marland Kitchen tretinoin (RETIN-A) 0.05 % cream Apply topically at bedtime.     . gabapentin (NEURONTIN) 100 MG capsule Take 1 capsule (100 mg total) by mouth 2 (two) times  daily for 10 days. For pain. 20 capsule 0   No current facility-administered medications on file prior to visit.     Past Medical History:  Diagnosis Date  . Arthritis   . GERD (gastroesophageal reflux disease)   . History of colon polyps 2006   adenomatous, no polyps in 2011  . History of hiatal hernia   . History of kidney stones   . Low blood pressure    occ.  . Migraines    decreased since menopause  . Osteopenia    dexa 08/2010: -2.3 L fem  . PONV (postoperative nausea and vomiting)    only with Demerol  . Skin cancer    Middle of chest  . Urine incontinence     Past Surgical History:  Procedure Laterality Date  . ABDOMINAL HYSTERECTOMY  2006  . BREAST EXCISIONAL BIOPSY Bilateral   . BREAST SURGERY     breast biopsy x's 2 (L) 92 and (R) 98  . COLONOSCOPY    . COLONOSCOPY WITH PROPOFOL N/A 02/11/2015   Procedure: COLONOSCOPY WITH PROPOFOL;  Surgeon: Clarene Essex, MD;  Location: [facility] ENDOSCOPY;  Service: Endoscopy;  Laterality: N/A;  . ESOPHAGOGASTRODUODENOSCOPY N/A 02/11/2015   Procedure: ESOPHAGOGASTRODUODENOSCOPY (EGD);  Surgeon: Altamese Dilling  Magod, MD;  Location: [place] ENDOSCOPY;  Service: Endoscopy;  Laterality: N/A;  . Laser endometriosis    . SEPTOPLASTY    . SHOULDER ARTHROSCOPY WITH BICEPS TENDON REPAIR Left 10/14/2018   Procedure: SHOULDER ARTHROSCOPY BICEP TENODESIS, SHOULDER DECOMPRESSION  AND SHOULDER MANIPULATION;  Surgeon: Renette Butters, MD;  Location: [place];  Service: Orthopedics;  Laterality: Left;  REGIONAL BLOCK  . TUBAL LIGATION      Social History   Socioeconomic History  . Marital status: Married    Spouse name: Not on file  . Number of children: 0  . Years of education: Not on file  . Highest education level: Not on file  Occupational History  . Occupation: nurse  Social Needs  . Financial resource strain: Not on file  . Food insecurity    Worry: Not on file    Inability: Not on file  . Transportation needs    Medical: Not on  file    Non-medical: Not on file  Tobacco Use  . Smoking status: Never Smoker  . Smokeless tobacco: Never Used  Substance and Sexual Activity  . Alcohol use: No  . Drug use: No  . Sexual activity: Not on file  Lifestyle  . Physical activity    Days per week: Not on file    Minutes per session: Not on file  . Stress: Not on file  Relationships  . Social Herbalist on phone: Not on file    Gets together: Not on file    Attends religious service: Not on file    Active member of club or organization: Not on file    Attends meetings of clubs or organizations: Not on file    Relationship status: Not on file  Other Topics Concern  . 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 Arthritis Mother   . Diverticulitis Mother   . Arthritis Father   . COPD Father   . Atrial fibrillation Father   . Diabetes Other   . Heart disease Other   . Hyperlipidemia Other   . Hypertension Other   . Stroke Other   . Breast cancer Sister 12       kidney cancer at age 51  . Diverticulitis Sister 74       s/p colectomy  . Diverticulitis Brother   . Pancreatic cancer Paternal Uncle 62  . Stomach cancer Paternal Uncle 76  . Ovarian cancer Other 24       mat great aunt through [facility] with ovarian    Review of Systems  Constitutional: Negative for chills and fever.  Eyes: Negative for visual disturbance.  Respiratory: Negative for cough, shortness of breath and wheezing.   Cardiovascular: Negative for chest pain, palpitations and leg swelling.  Gastrointestinal: Negative for abdominal pain, blood in stool, constipation, diarrhea and nausea.  Genitourinary: Negative for dysuria and hematuria.  Musculoskeletal: Positive for arthralgias.  Skin: Negative for color change and rash.  Neurological: Positive for headaches (occ). Negative for dizziness and light-headedness.   Psychiatric/Behavioral: Negative for dysphoric mood. The patient is not nervous/anxious.        Objective:   Vitals:   12/08/18 0832  BP: 116/82  Pulse: 72  Resp: 16  Temp: 98.5 F (36.9 C)  SpO2: 99%   Filed Weights   12/08/18  0947  Weight: 110 lb 12.8 oz (50.3 kg)   Body mass index is 19.32 kg/m.  BP Readings from Last 3 Encounters:  12/08/18 116/82  10/14/18 112/71  07/21/18 100/60    Wt Readings from Last 3 Encounters:  12/08/18 110 lb 12.8 oz (50.3 kg)  10/14/18 112 lb 9.6 oz (51.1 kg)  07/21/18 112 lb (50.8 kg)     Physical Exam Constitutional: She appears well-developed and well-nourished. No distress.  HENT:  Head: Normocephalic and atraumatic.  Right Ear: External ear normal. Normal ear canal and TM Left Ear: External ear normal.  Normal ear canal and TM Mouth/Throat: Oropharynx is clear and moist.  Eyes: Conjunctivae and EOM are normal.  Neck: Neck supple. No tracheal deviation present. No thyromegaly present.  No carotid bruit  Cardiovascular: Normal rate, regular rhythm and normal heart sounds.   No murmur heard.  No edema. Pulmonary/Chest: Effort normal and breath sounds normal. No respiratory distress. She has no wheezes. She has no rales.  Breast: deferred   Abdominal: Soft. She exhibits no distension. There is no tenderness.  Lymphadenopathy: She has no cervical adenopathy.  Skin: Skin is warm and dry. She is not diaphoretic.  Psychiatric: She has a normal mood and affect. Her behavior is normal.        Assessment & Plan:   Physical exam: Screening blood work   ordered Immunizations   All up to date Colonoscopy    Up to date  Mammogram    Up to date  Gyn   Up to date  - Dr Radene Knee Dexa   Due - ordered last week by Gyn Eye exams   Up to date  Exercise  Walking, doing PT for shoulder Weight   Normal BMI Skin sees derm annually Dr Ronnald Ramp - no concerns Substance abuse  none  See Problem List for Assessment and Plan of chronic medical  problems.   FU in one year

## 2018-12-08 ENCOUNTER — Other Ambulatory Visit: Payer: Self-pay

## 2018-12-08 ENCOUNTER — Encounter: Payer: Self-pay | Admitting: Internal Medicine

## 2018-12-08 ENCOUNTER — Ambulatory Visit: Payer: 59 | Admitting: Physical Therapy

## 2018-12-08 ENCOUNTER — Other Ambulatory Visit (INDEPENDENT_AMBULATORY_CARE_PROVIDER_SITE_OTHER): Payer: 59

## 2018-12-08 ENCOUNTER — Encounter: Payer: 59 | Admitting: Internal Medicine

## 2018-12-08 ENCOUNTER — Ambulatory Visit (INDEPENDENT_AMBULATORY_CARE_PROVIDER_SITE_OTHER): Payer: 59 | Admitting: Internal Medicine

## 2018-12-08 VITALS — BP 116/82 | HR 72 | Temp 98.5°F | Resp 16 | Ht 63.5 in | Wt 110.8 lb

## 2018-12-08 DIAGNOSIS — K219 Gastro-esophageal reflux disease without esophagitis: Secondary | ICD-10-CM | POA: Diagnosis not present

## 2018-12-08 DIAGNOSIS — Z0000 Encounter for general adult medical examination without abnormal findings: Secondary | ICD-10-CM

## 2018-12-08 DIAGNOSIS — M85859 Other specified disorders of bone density and structure, unspecified thigh: Secondary | ICD-10-CM

## 2018-12-08 LAB — COMPREHENSIVE METABOLIC PANEL
ALT: 21 U/L (ref 0–35)
AST: 22 U/L (ref 0–37)
Albumin: 4.3 g/dL (ref 3.5–5.2)
Alkaline Phosphatase: 46 U/L (ref 39–117)
BUN: 17 mg/dL (ref 6–23)
CO2: 27 mEq/L (ref 19–32)
Calcium: 9 mg/dL (ref 8.4–10.5)
Chloride: 105 mEq/L (ref 96–112)
Creatinine, Ser: 0.77 mg/dL (ref 0.40–1.20)
GFR: 76.44 mL/min (ref >60.00)
Glucose, Bld: 86 mg/dL (ref 70–99)
Potassium: 3.9 mEq/L (ref 3.5–5.1)
Sodium: 140 mEq/L (ref 135–145)
Total Bilirubin: 1.1 mg/dL (ref 0.2–1.2)
Total Protein: 6.6 g/dL (ref 6.0–8.3)

## 2018-12-08 LAB — LIPID PANEL
Cholesterol: 156 mg/dL (ref 0–200)
HDL: 84.4 mg/dL (ref >39.00)
LDL Cholesterol: 62 mg/dL (ref 0–99)
NonHDL: 71.85
Total CHOL/HDL Ratio: 2
Triglycerides: 50 mg/dL (ref 0.0–149.0)
VLDL: 10 mg/dL (ref 0.0–40.0)

## 2018-12-08 LAB — CBC WITH DIFFERENTIAL/PLATELET
Basophils Absolute: 0 10*3/uL (ref 0.0–0.1)
Basophils Relative: 0.8 % (ref 0.0–3.0)
Eosinophils Absolute: 0.1 10*3/uL (ref 0.0–0.7)
Eosinophils Relative: 1.3 % (ref 0.0–5.0)
HCT: 43.1 % (ref 36.0–46.0)
Hemoglobin: 14.3 g/dL (ref 12.0–15.0)
Lymphocytes Relative: 31.6 % (ref 12.0–46.0)
Lymphs Abs: 1.5 10*3/uL (ref 0.7–4.0)
MCHC: 33.1 g/dL (ref 30.0–36.0)
MCV: 90.6 fl (ref 78.0–100.0)
Monocytes Absolute: 0.4 10*3/uL (ref 0.1–1.0)
Monocytes Relative: 8.5 % (ref 3.0–12.0)
Neutro Abs: 2.8 10*3/uL (ref 1.4–7.7)
Neutrophils Relative %: 57.8 % (ref 43.0–77.0)
Platelets: 130 10*3/uL — ABNORMAL LOW (ref 150.0–400.0)
RBC: 4.76 Mil/uL (ref 3.87–5.11)
RDW: 13.1 % (ref 11.5–15.5)
WBC: 4.9 10*3/uL (ref 4.0–10.5)

## 2018-12-08 LAB — TSH: TSH: 1.27 u[IU]/mL (ref 0.35–4.50)

## 2018-12-08 NOTE — Assessment & Plan Note (Signed)
dexa ordered by gyn Taking calcium and vitamin d  Exercising - walking

## 2018-12-08 NOTE — Assessment & Plan Note (Signed)
GERD controlled Continue daily medication  

## 2018-12-09 ENCOUNTER — Encounter: Payer: Self-pay | Admitting: Internal Medicine

## 2018-12-12 ENCOUNTER — Ambulatory Visit: Payer: 59 | Admitting: Physical Therapy

## 2018-12-12 ENCOUNTER — Other Ambulatory Visit: Payer: Self-pay

## 2018-12-12 DIAGNOSIS — M6281 Muscle weakness (generalized): Secondary | ICD-10-CM

## 2018-12-12 DIAGNOSIS — M25612 Stiffness of left shoulder, not elsewhere classified: Secondary | ICD-10-CM | POA: Diagnosis not present

## 2018-12-12 DIAGNOSIS — M25512 Pain in left shoulder: Secondary | ICD-10-CM | POA: Diagnosis not present

## 2018-12-12 DIAGNOSIS — G8929 Other chronic pain: Secondary | ICD-10-CM | POA: Diagnosis not present

## 2018-12-13 NOTE — Therapy (Signed)
[place], [place], [address] Phone: [phone]   Fax:  [phone]  Physical Therapy Treatment  Patient Details  Name: Katherine Vaughan MRN: [id number] Date of Birth: 22-Jan-1961 Referring Provider (PT): Dr. Edmonia Lynch   Encounter Date: 12/12/2018  PT End of Session - 12/13/18 2203    Visit Number  12    Number of Visits  16    Date for PT Re-Evaluation  12/26/18    PT Start Time  0955    PT Stop Time  1040    PT Time Calculation (min)  45 min    Activity Tolerance  Patient tolerated treatment well    Behavior During Therapy  [facility] for tasks assessed/performed       Past Medical History:  Diagnosis Date  . Arthritis   . GERD (gastroesophageal reflux disease)   . History of colon polyps 2006   adenomatous, no polyps in 2011  . History of hiatal hernia   . History of kidney stones   . Low blood pressure    occ.  . Migraines    decreased since menopause  . Osteopenia    dexa 08/2010: -2.3 L fem  . PONV (postoperative nausea and vomiting)    only with Demerol  . Skin cancer    Middle of chest  . Urine incontinence     Past Surgical History:  Procedure Laterality Date  . ABDOMINAL HYSTERECTOMY  2006  . BREAST EXCISIONAL BIOPSY Bilateral   . BREAST SURGERY     breast biopsy x's 2 (L) 92 and (R) 98  . COLONOSCOPY    . COLONOSCOPY WITH PROPOFOL N/A 02/11/2015   Procedure: COLONOSCOPY WITH PROPOFOL;  Surgeon: Clarene Essex, MD;  Location: [facility] ENDOSCOPY;  Service: Endoscopy;  Laterality: N/A;  . ESOPHAGOGASTRODUODENOSCOPY N/A 02/11/2015   Procedure: ESOPHAGOGASTRODUODENOSCOPY (EGD);  Surgeon: Clarene Essex, MD;  Location: [facility] ENDOSCOPY;  Service: Endoscopy;  Laterality: N/A;  . Laser endometriosis    . SEPTOPLASTY    . SHOULDER ARTHROSCOPY WITH BICEPS TENDON REPAIR Left 10/14/2018   Procedure: SHOULDER ARTHROSCOPY BICEP TENODESIS, SHOULDER DECOMPRESSION  AND SHOULDER MANIPULATION;  Surgeon: Renette Butters, MD;  Location: [place];  Service: Orthopedics;  Laterality: Left;  REGIONAL BLOCK  . TUBAL LIGATION      There were no vitals filed for this visit.  Subjective Assessment - 12/13/18 2211    Subjective  no pain in my shoulder right now, but still hurts if I move it a certain way    Pertinent History  SHOULDER ARTHROSCOPY BICEP TENODESIS, SHOULDER DECOMPRESSION  AND SHOULDER MANIPULATION , arthritis, osteopenia     Limitations  Lifting;House hold activities    How long can you sit comfortably?  unlimited    How long can you stand comfortably?  unlimited    How long can you walk comfortably?  unlimited    Diagnostic tests  MRI: Negative MR arthrogram of the left shoulder. There is some contrast in the subacromial/subdeltoid bursa which is likely related to injection technique or less likely a focal perforation of the cuff    Patient Stated Goals  to decrease pain, return to normal activity no issues or limitation    Pain Onset  More than a month ago                       [facility] Adult PT Treatment/Exercise - 12/13/18 0001      Elbow Exercises  Elbow Flexion  Left;20 reps    Bar Weights/Barbell (Elbow Flexion)  3 lbs      Shoulder Exercises: Supine   Other Supine Exercises  D1 and D2 flexion with 1 lb 10 ea      Shoulder Exercises: Standing   Horizontal ABduction  20 reps;Theraband    Theraband Level (Shoulder Horizontal ABduction)  Level 1 (Yellow)    External Rotation  Both;20 reps    Theraband Level (Shoulder External Rotation)  Level 2 (Red)    Internal Rotation  Left;20 reps    Theraband Level (Shoulder Internal Rotation)  Level 2 (Red)    Extension  Strengthening;Both;20 reps    Theraband Level (Shoulder Extension)  Level 2 (Red)    Row  Strengthening;Both;20 reps    Theraband Level (Shoulder Row)  Level 2 (Red)    Other Standing Exercises  chest press with punch red X 20    Other Standing Exercises  lifting into 2nd cabinent 1 lb X 10  then top cabinent standing on 2 inch step  X10      Shoulder Exercises: Pulleys   Flexion  2 minutes    ABduction  2 minutes      Shoulder Exercises: ROM/Strengthening   Ball on Wall  green weighted ball 20 X for cirlces      Shoulder Exercises: Stretch   Other Shoulder Stretches  inf glide stretch at conter 10 sec X 10    Other Shoulder Stretches  bicep 3 way stretch holding 30 sec      Manual Therapy   Manual therapy comments  PROM and GH mobs gently to tolerance               PT Short Term Goals - 11/11/18 7591      PT SHORT TERM GOAL #1   Title  pt to be I with inital HEP     Status  Achieved      PT SHORT TERM GOAL #2   Title  Pt will utilize heat/ice as needed for post session soreness, pain     Baseline  uses minimally, takes bath usually , knows RICE     Status  Achieved      PT SHORT TERM GOAL #3   Title  Pt will begin to learn good body awareness and scapular position when performing HEP     Status  Achieved        PT Long Term Goals - 11/25/18 0942      PT LONG TERM GOAL #1   Title  Pt will be able to reach to mid low back with L UE for ADLs, dressing and pain <2/10     Status  On-going      PT LONG TERM GOAL #2   Title  Pt will increase L shoulder strengtht to >/= 4+/5 in all planes to promote shoulder stability while lifting, carrying items.     Status  On-going      PT LONG TERM GOAL #3   Title  Pt will be able to lift arm to 120 deg or more with light weight (<5 lbs)  for functional use in kitchen and at work     Status  Unable to assess      PT LONG TERM GOAL #4   Title  increase FOTO score to predicted level or less limited to demo improvement in function     Status  On-going      PT LONG TERM GOAL #5  Title  pt to be I with all HEP given as of last visit to maintain and progress current level of function     Status  On-going            Plan - 12/13/18 2208    Clinical Impression Statement  She still has pain but overall is  improving with PT and will continue to benefit from PT for ROM, strength, lifitng, and functional reaching.    PT Next Visit Plan  cont gentle strengthening, light manual and ROM to tolerance    PT Home Exercise Plan  supine cane, isometrics, shoulder IR/ER with theraband    Consulted and Agree with Plan of Care  Patient       Patient will benefit from skilled therapeutic intervention in order to improve the following deficits and impairments:  Pain, Impaired UE functional use, Increased fascial restricitons, Decreased strength, Decreased endurance, Decreased activity tolerance, Improper body mechanics, Postural dysfunction, Decreased range of motion, Decreased mobility  Visit Diagnosis: 1. Muscle weakness (generalized)   2. Stiffness of left shoulder, not elsewhere classified   3. Acute pain of left shoulder   4. Chronic left shoulder pain        Problem List Patient Active Problem List   Diagnosis Date Noted  . Thumb pain, left 05/02/2017  . Anxiety 08/13/2016  . Acne 08/01/2015  . Family history of malignant neoplasm of breast 01/04/2014  . Family history of malignant neoplasm of ovary 01/04/2014  . Vitamin D deficiency disease 07/04/2011  . Osteopenia   . GERD (gastroesophageal reflux disease)     Silvestre Mesi 12/13/2018, 10:11 PM  [facility] [address] [place], [place], [address] Phone: [phone]   Fax:  [phone]  Name: Katherine Vaughan MRN: [id number] Date of Birth: 1960/07/23

## 2018-12-15 ENCOUNTER — Other Ambulatory Visit: Payer: Self-pay

## 2018-12-15 ENCOUNTER — Encounter: Payer: Self-pay | Admitting: Physical Therapy

## 2018-12-15 ENCOUNTER — Ambulatory Visit: Payer: 59 | Admitting: Physical Therapy

## 2018-12-15 DIAGNOSIS — M6281 Muscle weakness (generalized): Secondary | ICD-10-CM | POA: Diagnosis not present

## 2018-12-15 DIAGNOSIS — G8929 Other chronic pain: Secondary | ICD-10-CM | POA: Diagnosis not present

## 2018-12-15 DIAGNOSIS — M25612 Stiffness of left shoulder, not elsewhere classified: Secondary | ICD-10-CM | POA: Diagnosis not present

## 2018-12-15 DIAGNOSIS — M25512 Pain in left shoulder: Secondary | ICD-10-CM | POA: Diagnosis not present

## 2018-12-15 NOTE — Therapy (Signed)
[place], [place], [address] Phone: [phone]   Fax:  [phone]  Physical Therapy Treatment  Patient Details  Name: Katherine Vaughan MRN: [id number] Date of Birth: 10-12-1960 Referring Provider (PT): Dr. Edmonia Lynch   Encounter Date: 12/15/2018  PT End of Session - 12/15/18 0911    Visit Number  13    Number of Visits  16    Date for PT Re-Evaluation  12/26/18    PT Start Time  0906    PT Stop Time  1002    PT Time Calculation (min)  56 min    Activity Tolerance  Patient tolerated treatment well    Behavior During Therapy  [facility] for tasks assessed/performed       Past Medical History:  Diagnosis Date  . Arthritis   . GERD (gastroesophageal reflux disease)   . History of colon polyps 2006   adenomatous, no polyps in 2011  . History of hiatal hernia   . History of kidney stones   . Low blood pressure    occ.  . Migraines    decreased since menopause  . Osteopenia    dexa 08/2010: -2.3 L fem  . PONV (postoperative nausea and vomiting)    only with Demerol  . Skin cancer    Middle of chest  . Urine incontinence     Past Surgical History:  Procedure Laterality Date  . ABDOMINAL HYSTERECTOMY  2006  . BREAST EXCISIONAL BIOPSY Bilateral   . BREAST SURGERY     breast biopsy x's 2 (L) 92 and (R) 98  . COLONOSCOPY    . COLONOSCOPY WITH PROPOFOL N/A 02/11/2015   Procedure: COLONOSCOPY WITH PROPOFOL;  Surgeon: Clarene Essex, MD;  Location: [facility] ENDOSCOPY;  Service: Endoscopy;  Laterality: N/A;  . ESOPHAGOGASTRODUODENOSCOPY N/A 02/11/2015   Procedure: ESOPHAGOGASTRODUODENOSCOPY (EGD);  Surgeon: Clarene Essex, MD;  Location: [facility] ENDOSCOPY;  Service: Endoscopy;  Laterality: N/A;  . Laser endometriosis    . SEPTOPLASTY    . SHOULDER ARTHROSCOPY WITH BICEPS TENDON REPAIR Left 10/14/2018   Procedure: SHOULDER ARTHROSCOPY BICEP TENODESIS, SHOULDER DECOMPRESSION  AND SHOULDER MANIPULATION;  Surgeon: Renette Butters, MD;  Location: [place];  Service: Orthopedics;  Laterality: Left;  REGIONAL BLOCK  . TUBAL LIGATION      There were no vitals filed for this visit.  Subjective Assessment - 12/15/18 0908    Subjective  Sore, the R one hurts too from time to time.  If I reach too high.    Currently in Pain?  Yes    Pain Score  1         [facility] Adult PT Treatment/Exercise - 12/15/18 0001      Shoulder Exercises: Standing   Horizontal ABduction Weight (lbs)  used magic circle for isometric work with flexion/extension     External Rotation  Strengthening;Both;10 reps;20 reps;Theraband    Theraband Level (Shoulder External Rotation)  Level 2 (Red)    Internal Rotation  Left;20 reps    Theraband Level (Shoulder Internal Rotation)  Level 2 (Red)    Flexion  Strengthening;Left;10 reps    Theraband Level (Shoulder Flexion)  Level 2 (Red)    Shoulder Flexion Weight (lbs)  2 sets , 1 with punch, 1 eith elbow ext      ABduction  Strengthening;Left;10 reps    Theraband Level (Shoulder ABduction)  Level 2 (Red)    Extension  Strengthening;Both;20 reps    Theraband Level (Shoulder Extension)  Level 3 (Green)    Row  Strengthening;Both;20 reps    Theraband Level (Shoulder Row)  Level 3 (Green)      Shoulder Exercises: ROM/Strengthening   UBE (Upper Arm Bike)  4 min  L1 , 2 min each direction     Modified Plank Limitations  banded reaches yellow x 10 each  , semi circles , pain in Rt UE with overhead reaching     Other ROM/Strengthening Exercises  wall exercises for AROM, isometric adduction and abduction       Shoulder Exercises: Stretch   Corner Stretch  3 reps;30 seconds    Corner Stretch Limitations  used doorway for flexion and ER       Cryotherapy   Number Minutes Cryotherapy  10 Minutes    Cryotherapy Location  Shoulder    Type of Cryotherapy  Ice pack      Manual Therapy   Manual therapy comments  PROM and GH mobs gently to tolerance    Joint Mobilization  light inferior  mobs to L shoulder     Soft tissue mobilization  anterior lateral L shoulder     Passive ROM  flexion, abduction and ER/IR gentle to tolerance                PT Short Term Goals - 11/11/18 7116      PT SHORT TERM GOAL #1   Title  pt to be I with inital HEP     Status  Achieved      PT SHORT TERM GOAL #2   Title  Pt will utilize heat/ice as needed for post session soreness, pain     Baseline  uses minimally, takes bath usually , knows RICE     Status  Achieved      PT SHORT TERM GOAL #3   Title  Pt will begin to learn good body awareness and scapular position when performing HEP     Status  Achieved        PT Long Term Goals - 11/25/18 0942      PT LONG TERM GOAL #1   Title  Pt will be able to reach to mid low back with L UE for ADLs, dressing and pain <2/10     Status  On-going      PT LONG TERM GOAL #2   Title  Pt will increase L shoulder strengtht to >/= 4+/5 in all planes to promote shoulder stability while lifting, carrying items.     Status  On-going      PT LONG TERM GOAL #3   Title  Pt will be able to lift arm to 120 deg or more with light weight (<5 lbs)  for functional use in kitchen and at work     Status  Unable to assess      PT LONG TERM GOAL #4   Title  increase FOTO score to predicted level or less limited to demo improvement in function     Status  On-going      PT LONG TERM GOAL #5   Title  pt to be I with all HEP given as of last visit to maintain and progress current level of function     Status  On-going            Plan - 12/15/18 0912    Clinical Impression Statement  Patient continues to have soreness and discomfort in her L arm and now wth pain in Rt UE due to  chronic issues.  Pain in abduction in particular, tries to push through pain with exercises.   She is dealing with 4 family members with COVID and is very stressed out.    PT Treatment/Interventions  ADLs/Self Care Home Management;Cryotherapy;Electrical  Stimulation;Iontophoresis 4mg /ml Dexamethasone;Moist Heat;Ultrasound;Therapeutic activities;Therapeutic exercise;Patient/family education;Dry needling;Passive range of motion;Taping;Manual techniques;Vasopneumatic Device;Neuromuscular re-education;Scar mobilization    PT Next Visit Plan  check GOALS, cont gentle strengthening, light manual and ROM to tolerance, upgrade HEP bands    PT Home Exercise Plan  supine cane, isometrics, shoulder IR/ER with theraband    Consulted and Agree with Plan of Care  Patient       Patient will benefit from skilled therapeutic intervention in order to improve the following deficits and impairments:  Pain, Impaired UE functional use, Increased fascial restricitons, Decreased strength, Decreased endurance, Decreased activity tolerance, Improper body mechanics, Postural dysfunction, Decreased range of motion, Decreased mobility  Visit Diagnosis: 1. Muscle weakness (generalized)   2. Stiffness of left shoulder, not elsewhere classified   3. Acute pain of left shoulder   4. Chronic left shoulder pain        Problem List Patient Active Problem List   Diagnosis Date Noted  . Thumb pain, left 05/02/2017  . Anxiety 08/13/2016  . Acne 08/01/2015  . Family history of malignant neoplasm of breast 01/04/2014  . Family history of malignant neoplasm of ovary 01/04/2014  . Vitamin D deficiency disease 07/04/2011  . Osteopenia   . GERD (gastroesophageal reflux disease)     Dyonna Jaspers 12/15/2018, 9:56 AM  [place], [place], [address] Phone: [phone]   Fax:  [phone]  Name: SELINE ENZOR MRN: [id number] Date of Birth: 14-Oct-1960   Raeford Razor, PT 12/15/18 9:56 AM Phone: [phone] Fax: [phone]

## 2018-12-18 ENCOUNTER — Ambulatory Visit: Payer: 59 | Admitting: Physical Therapy

## 2018-12-18 ENCOUNTER — Encounter: Payer: Self-pay | Admitting: Physical Therapy

## 2018-12-18 ENCOUNTER — Other Ambulatory Visit: Payer: Self-pay

## 2018-12-18 DIAGNOSIS — M6281 Muscle weakness (generalized): Secondary | ICD-10-CM | POA: Diagnosis not present

## 2018-12-18 DIAGNOSIS — M25512 Pain in left shoulder: Secondary | ICD-10-CM | POA: Diagnosis not present

## 2018-12-18 DIAGNOSIS — G8929 Other chronic pain: Secondary | ICD-10-CM

## 2018-12-18 DIAGNOSIS — M25612 Stiffness of left shoulder, not elsewhere classified: Secondary | ICD-10-CM | POA: Diagnosis not present

## 2018-12-18 NOTE — Therapy (Signed)
[place], [place], [address] Phone: [phone]   Fax:  [phone]  Physical Therapy Treatment  Patient Details  Name: Katherine Vaughan MRN: [id number] Date of Birth: [birth year] Referring Provider (PT): Dr. Edmonia Lynch   Encounter Date: 12/18/2018  PT End of Session - 12/18/18 1617    Visit Number  14    Number of Visits  16    Date for PT Re-Evaluation  12/26/18    PT Start Time  1600    PT Stop Time  1655    PT Time Calculation (min)  55 min    Activity Tolerance  Patient tolerated treatment well    Behavior During Therapy  [facility] for tasks assessed/performed       Past Medical History:  Diagnosis Date  . Arthritis   . GERD (gastroesophageal reflux disease)   . History of colon polyps 2006   adenomatous, no polyps in 2011  . History of hiatal hernia   . History of kidney stones   . Low blood pressure    occ.  . Migraines    decreased since menopause  . Osteopenia    dexa 08/2010: -2.3 L fem  . PONV (postoperative nausea and vomiting)    only with Demerol  . Skin cancer    Middle of chest  . Urine incontinence     Past Surgical History:  Procedure Laterality Date  . ABDOMINAL HYSTERECTOMY  2006  . BREAST EXCISIONAL BIOPSY Bilateral   . BREAST SURGERY     breast biopsy x's 2 (L) 92 and (R) 98  . COLONOSCOPY    . COLONOSCOPY WITH PROPOFOL N/A 02/11/2015   Procedure: COLONOSCOPY WITH PROPOFOL;  Surgeon: Clarene Essex, MD;  Location: [facility] ENDOSCOPY;  Service: Endoscopy;  Laterality: N/A;  . ESOPHAGOGASTRODUODENOSCOPY N/A 02/11/2015   Procedure: ESOPHAGOGASTRODUODENOSCOPY (EGD);  Surgeon: Clarene Essex, MD;  Location: [facility] ENDOSCOPY;  Service: Endoscopy;  Laterality: N/A;  . Laser endometriosis    . SEPTOPLASTY    . SHOULDER ARTHROSCOPY WITH BICEPS TENDON REPAIR Left 10/14/2018   Procedure: SHOULDER ARTHROSCOPY BICEP TENODESIS, SHOULDER DECOMPRESSION  AND SHOULDER MANIPULATION;  Surgeon: Renette Butters, MD;  Location: [place];  Service: Orthopedics;  Laterality: Left;  REGIONAL BLOCK  . TUBAL LIGATION      There were no vitals filed for this visit.  Subjective Assessment - 12/18/18 1608    Subjective  Its naggy today.  Was sore after last visit.    Currently in Pain?  Yes    Pain Score  3     Pain Location  Shoulder    Pain Orientation  Left    Pain Descriptors / Indicators  Nagging    Pain Type  Chronic pain    Pain Onset  More than a month ago    Pain Frequency  Intermittent    Aggravating Factors   reach out or overhead    Pain Relieving Factors  rest, ice         [facility] Adult PT Treatment/Exercise - 12/18/18 0001      Elbow Exercises   Elbow Flexion  Strengthening;Right;Left;10 reps    Bar Weights/Barbell (Elbow Flexion)  4 lbs    Elbow Extension  Strengthening;Both;15 reps    Bar Weights/Barbell (Elbow Extension)  4 lbs      Shoulder Exercises: Sidelying   External Rotation  Strengthening;Left;15 reps    External Rotation Weight (lbs)  3    Flexion  Strengthening;Left;10 reps  Flexion Weight (lbs)  2    ABduction  Strengthening;Left;10 reps    ABduction Weight (lbs)  2      Shoulder Exercises: Standing   Horizontal ABduction  Strengthening;Both;12 reps    Theraband Level (Shoulder Horizontal ABduction)  Level 3 (Green)    Other Standing Exercises  lat pull down single arm green x 8       Cryotherapy   Number Minutes Cryotherapy  10 Minutes    Cryotherapy Location  Shoulder    Type of Cryotherapy  Ice pack      Manual Therapy   Joint Mobilization  light inferior mobs to L shoulder     Soft tissue mobilization  anterior lateral L shoulder     Passive ROM  flexion, abduction and ER/IR gentle to tolerance    to tolerance               PT Short Term Goals - 11/11/18 1761      PT SHORT TERM GOAL #1   Title  pt to be I with inital HEP     Status  Achieved      PT SHORT TERM GOAL #2   Title  Pt will utilize heat/ice as needed  for post session soreness, pain     Baseline  uses minimally, takes bath usually , knows RICE     Status  Achieved      PT SHORT TERM GOAL #3   Title  Pt will begin to learn good body awareness and scapular position when performing HEP     Status  Achieved        PT Long Term Goals - 11/25/18 0942      PT LONG TERM GOAL #1   Title  Pt will be able to reach to mid low back with L UE for ADLs, dressing and pain <2/10     Status  On-going      PT LONG TERM GOAL #2   Title  Pt will increase L shoulder strengtht to >/= 4+/5 in all planes to promote shoulder stability while lifting, carrying items.     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  Limtied tolerance due to pain above 90 deg. Goals in progress.    PT Treatment/Interventions  ADLs/Self Care Home Management;Cryotherapy;Electrical Stimulation;Iontophoresis 4mg /ml Dexamethasone;Moist Heat;Ultrasound;Therapeutic activities;Therapeutic exercise;Patient/family education;Dry needling;Passive range of motion;Taping;Manual techniques;Vasopneumatic Device;Neuromuscular re-education;Scar mobilization    PT Next Visit Plan  check GOALS, cont gentle strengthening, light manual and ROM to tolerance, upgrade HEP bands       Patient will benefit from skilled therapeutic intervention in order to improve the following deficits and  impairments:  Pain, Impaired UE functional use,  Increased fascial restricitons, Decreased strength, Decreased endurance, Decreased activity tolerance, Improper body mechanics, Postural dysfunction, Decreased range of motion, Decreased mobility  Visit Diagnosis: 1. Muscle weakness (generalized)   2. Stiffness of left shoulder, not elsewhere classified   3. Acute pain of left shoulder   4. Chronic left shoulder pain        Problem List Patient Active Problem List   Diagnosis Date Noted  . Thumb pain, left 05/02/2017  . Anxiety 08/13/2016  . Acne 08/01/2015  . Family history of malignant neoplasm of breast 01/04/2014  . Family history of malignant neoplasm of ovary 01/04/2014  . Vitamin D deficiency disease 07/04/2011  . Osteopenia   . GERD (gastroesophageal reflux disease)     Martyn Timme 12/18/2018, 4:53 PM  [facility] [address] [place], [place], [address] Phone: [phone]   Fax:  [phone]  Name: ARTIE TAKAYAMA MRN: [id number] Date of Birth: Oct 15, 1960  Raeford Razor, PT 12/18/18 4:53 PM Phone: [phone] Fax: [phone]

## 2018-12-19 ENCOUNTER — Encounter: Payer: 59 | Admitting: Physical Therapy

## 2018-12-23 ENCOUNTER — Other Ambulatory Visit: Payer: Self-pay

## 2018-12-23 ENCOUNTER — Encounter: Payer: Self-pay | Admitting: Physical Therapy

## 2018-12-23 ENCOUNTER — Ambulatory Visit: Payer: 59 | Admitting: Physical Therapy

## 2018-12-23 DIAGNOSIS — M25512 Pain in left shoulder: Secondary | ICD-10-CM | POA: Diagnosis not present

## 2018-12-23 DIAGNOSIS — G8929 Other chronic pain: Secondary | ICD-10-CM

## 2018-12-23 DIAGNOSIS — M6281 Muscle weakness (generalized): Secondary | ICD-10-CM

## 2018-12-23 DIAGNOSIS — M25612 Stiffness of left shoulder, not elsewhere classified: Secondary | ICD-10-CM | POA: Diagnosis not present

## 2018-12-23 NOTE — Patient Instructions (Signed)
Low Row: Standing    Face anchor, feet shoulder width apart. Palms up, pull arms back, squeezing shoulder blades together. Repeat _10-20_ times per set. Do _2_ sets per session. Do __4 sessions per week. Anchor Height: Waist  http://tub.exer.us/66   Copyright  VHI. All rights reserved.   EXTENSION: Standing - Resistance Band: Stable (Active)    Stand, right arm at side. Against yellow resistance band, draw arm backward, as far as possible, keeping elbow straight. Complete __1-2_ sets of __10-20_ repetitions. Perform _4 __ sessions per week   Copyright  VHI. All rights reserved.     Band pulls back (green)  IN and OUT ( red )  Stretching daily Use heat Try TENS unit  TENS UNIT  This is helpful for muscle pain and spasm.   Search and Purchase a TENS 7000 2nd edition at www.tenspros.com or www.amazon.com  (It should be less than $30)     TENS unit instructions:   Do not shower or bathe with the unit on  Turn the unit off before removing electrodes or batteries  If the electrodes lose stickiness add a drop of water to the electrodes after they are disconnected from the unit and place on plastic sheet. If you continued to have difficulty, call the TENS unit company to purchase more electrodes.  Do not apply lotion on the skin area prior to use. Make sure the skin is clean and dry as this will help prolong the life of the electrodes.  After use, always check skin for unusual red areas, rash or other skin difficulties. If there are any skin problems, does not apply electrodes to the same area.  Never remove the electrodes from the unit by pulling the wires.  Do not use the TENS unit or electrodes other than as directed.  Do not change electrode placement without consulting your therapist or physician.  Keep 2 fingers with between each electrode.

## 2018-12-23 NOTE — Therapy (Signed)
[place], [place], [address] Phone: [phone]   Fax:  [phone]  Physical Therapy Treatment  Patient Details  Name: Katherine Vaughan MRN: [id number] Date of Birth: [birth year] Referring Provider (PT): Dr. Edmonia Lynch   Encounter Date: 12/23/2018  PT End of Session - 12/23/18 1619    Visit Number  15    Date for PT Re-Evaluation  12/26/18    PT Start Time  1541   pt late   PT Stop Time  1630    PT Time Calculation (min)  49 min    Activity Tolerance  Patient tolerated treatment well    Behavior During Therapy  [facility] for tasks assessed/performed       Past Medical History:  Diagnosis Date  . Arthritis   . GERD (gastroesophageal reflux disease)   . History of colon polyps 2006   adenomatous, no polyps in 2011  . History of hiatal hernia   . History of kidney stones   . Low blood pressure    occ.  . Migraines    decreased since menopause  . Osteopenia    dexa 08/2010: -2.3 L fem  . PONV (postoperative nausea and vomiting)    only with Demerol  . Skin cancer    Middle of chest  . Urine incontinence     Past Surgical History:  Procedure Laterality Date  . ABDOMINAL HYSTERECTOMY  2006  . BREAST EXCISIONAL BIOPSY Bilateral   . BREAST SURGERY     breast biopsy x's 2 (L) 92 and (R) 98  . COLONOSCOPY    . COLONOSCOPY WITH PROPOFOL N/A 02/11/2015   Procedure: COLONOSCOPY WITH PROPOFOL;  Surgeon: Clarene Essex, MD;  Location[facility] ENDOSCOPY;  Service: Endoscopy;  Laterality: N/A;  . ESOPHAGOGASTRODUODENOSCOPY N/A 02/11/2015   Procedure: ESOPHAGOGASTRODUODENOSCOPY (EGD);  Surgeon: Clarene Essex, MD;  Location: [facility] ENDOSCOPY;  Service: Endoscopy;  Laterality: N/A;  . Laser endometriosis    . SEPTOPLASTY    . SHOULDER ARTHROSCOPY WITH BICEPS TENDON REPAIR Left 10/14/2018   Procedure: SHOULDER ARTHROSCOPY BICEP TENODESIS, SHOULDER DECOMPRESSION  AND SHOULDER MANIPULATION;  Surgeon: Renette Butters, MD;   Location: [place];  Service: Orthopedics;  Laterality: Left;  REGIONAL BLOCK  . TUBAL LIGATION      There were no vitals filed for this visit.  Subjective Assessment - 12/23/18 1546    Subjective  Pt comes in the clinic with frustration and pain.  She says that the more she works it the more it hurts.  She feels that when we added more resistance the pain has gotten worse.  Its  3/10 and she will need help getting her shirt off.  Pain with reaching all directions, dressing.    Currently in Pain?  Yes    Pain Score  3     Pain Location  Shoulder    Pain Orientation  Left    Pain Descriptors / Indicators  Nagging    Pain Type  Chronic pain    Pain Onset  More than a month ago    Pain Frequency  Intermittent    Aggravating Factors   reaching    Pain Relieving Factors  rest, ice    Multiple Pain Sites  No         [facility] PT Assessment - 12/23/18 0001      AROM   Left Shoulder Flexion  138 Degrees    Left Shoulder ABduction  100 Degrees    Left  Shoulder Internal Rotation  --   FR to L1   Left Shoulder External Rotation  --   FR to T2     Strength   Left Shoulder Flexion  4/5    Left Shoulder ABduction  4/5    Left Shoulder Internal Rotation  3+/5    Left Shoulder External Rotation  4/5        [facility] Adult PT Treatment/Exercise - 12/23/18 0001      Shoulder Exercises: Standing   External Rotation  Strengthening;Both;10 reps;20 reps;Theraband    Theraband Level (Shoulder External Rotation)  Level 2 (Red)    Internal Rotation  Left;20 reps    Theraband Level (Shoulder Internal Rotation)  Level 2 (Red)    Extension  Strengthening;Both;20 reps    Theraband Level (Shoulder Extension)  Level 3 (Green)    Row  Strengthening;Both;20 reps    Theraband Level (Shoulder Row)  Level 3 (Green)      Shoulder Exercises: Pulleys   Flexion  2 minutes    Scaption  2 minutes      Modalities   Modalities  Electrical Stimulation;Moist Heat      Moist Heat Therapy    Number Minutes Moist Heat  10 Minutes    Moist Heat Location  Shoulder      Electrical Stimulation   Electrical Stimulation Location  L shoulder     Electrical Stimulation Action  IFC x 15     Electrical Stimulation Parameters  to tolerance     Electrical Stimulation Goals  Pain               PT Short Term Goals - 12/23/18 1557      PT SHORT TERM GOAL #1   Title  pt to be I with inital HEP     Status  Achieved      PT SHORT TERM GOAL #2   Title  Pt will utilize heat/ice as needed for post session soreness, pain     Status  Achieved      PT SHORT TERM GOAL #3   Title  Pt will begin to learn good body awareness and scapular position when performing HEP     Status  Achieved        PT Long Term Goals - 12/23/18 1557      PT LONG TERM GOAL #1   Title  Pt will be able to reach to mid low back with L UE for ADLs, dressing and pain <2/10     Baseline  pain increased but can do    Status  Partially Met      PT LONG TERM GOAL #2   Title  Pt will increase L shoulder strengtht to >/= 4+/5 in all planes to promote shoulder stability while lifting, carrying items.     Status  On-going      PT LONG TERM GOAL #3   Title  Pt will be able to lift arm to 120 deg or more with light weight (<5 lbs)  for functional use in kitchen and at work     Baseline  painful    Status  Achieved      PT LONG TERM GOAL #4   Title  increase FOTO score to predicted level or less limited to demo improvement in function     Status  Unable to assess      PT LONG TERM GOAL #5   Title  pt to be I with all HEP given as of  last visit to maintain and progress current level of function     Status  Achieved            Plan - 12/23/18 1616    Clinical Impression Statement  Patient discouraged.  Will do FOTO next visit and end PT.  She did respond well to TENS for comfort and pain control when not working.  AROM and strength is plateauing and pain is mentally tiring her out.  Sees MD next week  12/31/18.    PT Treatment/Interventions  ADLs/Self Care Home Management;Cryotherapy;Electrical Stimulation;Iontophoresis 63m/ml Dexamethasone;Moist Heat;Ultrasound;Therapeutic activities;Therapeutic exercise;Patient/family education;Dry needling;Passive range of motion;Taping;Manual techniques;Vasopneumatic Device;Neuromuscular re-education;Scar mobilization    PT Next Visit Plan  FOTO DC cont gentle strengthening, light manual and ROM to tolerance, upgrade HEP bands. give bicep curl red band    PT Home Exercise Plan  supine cane, isometrics, shoulder IR/ER with theraband, row anf extension, biceps stretch on wall.    Consulted and Agree with Plan of Care  Patient       Patient will benefit from skilled therapeutic intervention in order to improve the following deficits and impairments:  Pain, Impaired UE functional use, Increased fascial restricitons, Decreased strength, Decreased endurance, Decreased activity tolerance, Improper body mechanics, Postural dysfunction, Decreased range of motion, Decreased mobility  Visit Diagnosis: 1. Muscle weakness (generalized)   2. Stiffness of left shoulder, not elsewhere classified   3. Acute pain of left shoulder   4. Chronic left shoulder pain        Problem List Patient Active Problem List   Diagnosis Date Noted  . Thumb pain, left 05/02/2017  . Anxiety 08/13/2016  . Acne 08/01/2015  . Family history of malignant neoplasm of breast 01/04/2014  . Family history of malignant neoplasm of ovary 01/04/2014  . Vitamin D deficiency disease 07/04/2011  . Osteopenia   . GERD (gastroesophageal reflux disease)     Katherine Vaughan 12/23/2018, 4:20 PM  [facility]GW[place] [address]Phone: [phone]  Fax:  [phone] Name: Katherine HALLISEYMRN: [id number] of Birth: 502/03/1961 JRaeford Razor PT 12/23/18 4:20 PM Phone: [phone]: [phone]

## 2018-12-25 ENCOUNTER — Encounter: Payer: Self-pay | Admitting: Physical Therapy

## 2018-12-25 ENCOUNTER — Ambulatory Visit: Payer: 59 | Admitting: Physical Therapy

## 2018-12-25 ENCOUNTER — Other Ambulatory Visit: Payer: Self-pay

## 2018-12-25 DIAGNOSIS — M6281 Muscle weakness (generalized): Secondary | ICD-10-CM | POA: Diagnosis not present

## 2018-12-25 DIAGNOSIS — M25612 Stiffness of left shoulder, not elsewhere classified: Secondary | ICD-10-CM

## 2018-12-25 DIAGNOSIS — M25512 Pain in left shoulder: Secondary | ICD-10-CM | POA: Diagnosis not present

## 2018-12-25 DIAGNOSIS — G8929 Other chronic pain: Secondary | ICD-10-CM | POA: Diagnosis not present

## 2018-12-25 NOTE — Patient Instructions (Signed)

## 2018-12-25 NOTE — Therapy (Signed)
[place], [place], [address] Phone: [phone]   Fax:  [phone]  Physical Therapy Treatment/Discharge   Patient Details  Name: Katherine Vaughan MRN: [id number] Date of Birth: Dec 26, 1960 Referring Provider (PT): Dr. Edmonia Lynch   Encounter Date: 12/25/2018  PT End of Session - 12/25/18 1639    Visit Number  16    Date for PT Re-Evaluation  12/26/18    PT Start Time  1612    PT Stop Time  1650    PT Time Calculation (min)  38 min    Activity Tolerance  Patient tolerated treatment well    Behavior During Therapy  [facility] for tasks assessed/performed       Past Medical History:  Diagnosis Date  . Arthritis   . GERD (gastroesophageal reflux disease)   . History of colon polyps 2006   adenomatous, no polyps in 2011  . History of hiatal hernia   . History of kidney stones   . Low blood pressure    occ.  . Migraines    decreased since menopause  . Osteopenia    dexa 08/2010: -2.3 L fem  . PONV (postoperative nausea and vomiting)    only with Demerol  . Skin cancer    Middle of chest  . Urine incontinence     Past Surgical History:  Procedure Laterality Date  . ABDOMINAL HYSTERECTOMY  2006  . BREAST EXCISIONAL BIOPSY Bilateral   . BREAST SURGERY     breast biopsy x's 2 (L) 92 and (R) 98  . COLONOSCOPY    . COLONOSCOPY WITH PROPOFOL N/A 02/11/2015   Procedure: COLONOSCOPY WITH PROPOFOL;  Surgeon: Clarene Essex, MD;  Location[facility] ENDOSCOPY;  Service: Endoscopy;  Laterality: N/A;  . ESOPHAGOGASTRODUODENOSCOPY N/A 02/11/2015   Procedure: ESOPHAGOGASTRODUODENOSCOPY (EGD);  Surgeon: Clarene Essex, MD;  Location: [facility] ENDOSCOPY;  Service: Endoscopy;  Laterality: N/A;  . Laser endometriosis    . SEPTOPLASTY    . SHOULDER ARTHROSCOPY WITH BICEPS TENDON REPAIR Left 10/14/2018   Procedure: SHOULDER ARTHROSCOPY BICEP TENODESIS, SHOULDER DECOMPRESSION  AND SHOULDER MANIPULATION;  Surgeon: Renette Butters, MD;   Location: [place];  Service: Orthopedics;  Laterality: Left;  REGIONAL BLOCK  . TUBAL LIGATION      There were no vitals filed for this visit.  Subjective Assessment - 12/25/18 1615    Subjective  Patient like the IFC last time.  Did only a little stretching.  Last day today.    Currently in Pain?  Yes    Pain Score  2     Pain Location  Shoulder    Pain Orientation  Left    Pain Descriptors / Indicators  Nagging    Pain Type  Chronic pain    Pain Onset  More than a month ago    Pain Frequency  Intermittent    Aggravating Factors   reaching, over exercising    Pain Relieving Factors  rest, ice, stim         [facility] PT Assessment - 12/25/18 0001      Observation/Other Assessments   Focus on Therapeutic Outcomes (FOTO)   40%      AROM   Left Shoulder Flexion  140 Degrees    Left Shoulder ABduction  112 Degrees          [facility] Adult PT Treatment/Exercise - 12/25/18 0001      Elbow Exercises   Elbow Flexion  Strengthening;Right;Left;10 reps    Bar  Weights/Barbell (Elbow Flexion)  4 lbs    Forearm Supination  Strengthening;Both;10 reps    Bar Weights/Barbell (Forearm Supination)  4 lbs    Other elbow exercises  reverse fly x 3 lbs x 10     Other elbow exercises  hammer curl x 3 lbs x 10,      Shoulder Exercises: Standing   Flexion  Strengthening;Both;10 reps    Shoulder Flexion Weight (lbs)  2    ABduction  Strengthening;Both;10 reps    Shoulder ABduction Weight (lbs)  2      Shoulder Exercises: Pulleys   Flexion  2 minutes    Scaption  2 minutes      Shoulder Exercises: ROM/Strengthening   Other ROM/Strengthening Exercises  reaching for stretch into flexion and abduction x 20 sec x 5       Shoulder Exercises: Stretch   External Rotation Stretch  3 reps;30 seconds   1/2 goal post      Moist Heat Therapy   Number Minutes Moist Heat  12 Minutes    Moist Heat Location  Shoulder      Electrical Stimulation   Electrical Stimulation Location  L  shoulder     Electrical Stimulation Action  IFC x 12     Electrical Stimulation Parameters  to tol     Electrical Stimulation Goals  Pain             PT Education - 12/25/18 1637    Education Details  final HEP , DC       PT Short Term Goals - 12/23/18 1557      PT SHORT TERM GOAL #1   Title  pt to be I with inital HEP     Status  Achieved      PT SHORT TERM GOAL #2   Title  Pt will utilize heat/ice as needed for post session soreness, pain     Status  Achieved      PT SHORT TERM GOAL #3   Title  Pt will begin to learn good body awareness and scapular position when performing HEP     Status  Achieved        PT Long Term Goals - 12/23/18 1557      PT LONG TERM GOAL #1   Title  Pt will be able to reach to mid low back with L UE for ADLs, dressing and pain <2/10     Baseline  pain increased but can do    Status  Partially Met      PT LONG TERM GOAL #2   Title  Pt will increase L shoulder strengtht to >/= 4+/5 in all planes to promote shoulder stability while lifting, carrying items.     Status  On-going      PT LONG TERM GOAL #3   Title  Pt will be able to lift arm to 120 deg or more with light weight (<5 lbs)  for functional use in kitchen and at work     Baseline  painful    Status  Achieved      PT LONG TERM GOAL #4   Title  increase FOTO score to predicted level or less limited to demo improvement in function     Status  Unable to assess      PT LONG TERM GOAL #5   Title  pt to be I with all HEP given as of last visit to maintain and progress current level of function  Status  Achieved              Patient will benefit from skilled therapeutic intervention in order to improve the following deficits and impairments:     Visit Diagnosis: No diagnosis found.     Problem List Patient Active Problem List   Diagnosis Date Noted  . Thumb pain, left 05/02/2017  . Anxiety 08/13/2016  . Acne 08/01/2015  . Family history of malignant neoplasm  of breast 01/04/2014  . Family history of malignant neoplasm of ovary 01/04/2014  . Vitamin D deficiency disease 07/04/2011  . Osteopenia   . GERD (gastroesophageal reflux disease)     Katherine Vaughan 12/25/2018, 4:44 PM  [facility] [address] [place], [place], [address] Phone: [phone]   Fax:  [phone]  Name: ARADIA ESTEY MRN: [id number] Date of Birth: 1960-08-03

## 2018-12-27 MED FILL — ESOMEPRAZOLE MAG DR 40 MG C: 40 | 30 days supply | Qty: 30 | Fill #2

## 2018-12-29 ENCOUNTER — Ambulatory Visit (INDEPENDENT_AMBULATORY_CARE_PROVIDER_SITE_OTHER): Payer: 59 | Admitting: Internal Medicine

## 2018-12-29 ENCOUNTER — Encounter: Payer: Self-pay | Admitting: Internal Medicine

## 2018-12-29 ENCOUNTER — Other Ambulatory Visit (INDEPENDENT_AMBULATORY_CARE_PROVIDER_SITE_OTHER): Payer: 59

## 2018-12-29 DIAGNOSIS — M791 Myalgia, unspecified site: Secondary | ICD-10-CM | POA: Insufficient documentation

## 2018-12-29 DIAGNOSIS — M2550 Pain in unspecified joint: Secondary | ICD-10-CM | POA: Diagnosis not present

## 2018-12-29 LAB — C-REACTIVE PROTEIN: CRP: <1 mg/dL (ref 0.5–20.0)

## 2018-12-29 LAB — CK: Total CK: 42 U/L (ref 7–177)

## 2018-12-29 LAB — SEDIMENTATION RATE: Sed Rate: 2 mm/hr (ref 0–30)

## 2018-12-29 NOTE — Assessment & Plan Note (Signed)
Having some chronic muscle pain Check autoimmune blood work including ck,esr, crp

## 2018-12-29 NOTE — Assessment & Plan Note (Signed)
Joint pain in multiple joints  Family history of RA Following with ortho Will check autoimmune blood work

## 2018-12-29 NOTE — Progress Notes (Signed)
Virtual Visit via Video Note  I connected with Katherine Vaughan on 12/29/18 at  9:30 AM EDT by a video enabled telemedicine application and verified that I am speaking with the correct person using two identifiers.   I discussed the limitations of evaluation and management by telemedicine and the availability of in person appointments. The patient expressed understanding and agreed to proceed.  The patient is currently at home and I am in the office.    No referring provider.    History of Present Illness: This is an acute visit for shoulder pain   She had left shoulder surgery in May for biceps tear and just completed PT.  She is still doing some stretches and exercises at home.  She is very frustrated because both of her shoulders hurt.  Her left shoulder still hurts and she does not feel that surgery did much-it may have helped slightly.  She does have a follow-up appointment with orthopedic in 2 days.  She is concerned about what is causing her pain.  Certain movements increase her pain.  Her sister does have rheumatoid arthritis and she worries about that.  She also has some muscle pain and tendon pain.  She will take Advil and most of the time it helps, but sometimes it does not.  She is unsure what she needs to do at this point to help figure this out because the pain is limiting her.     Review of Systems  Constitutional: Negative for fever.  Musculoskeletal: Positive for back pain (chronic), joint pain (some swelling in some joints) and myalgias.  Skin: Negative for rash.     Social History   Socioeconomic History  . Marital status: Married    Spouse name: Not on file  . Number of children: 0  . Years of education: Not on file  . Highest education level: Not on file  Occupational History  . Occupation: nurse  Social Needs  . Financial resource strain: Not on file  . Food insecurity    Worry: Not on file    Inability: Not on file  . Transportation needs    Medical: Not  on file    Non-medical: Not on file  Tobacco Use  . Smoking status: Never Smoker  . Smokeless tobacco: Never Used  Substance and Sexual Activity  . Alcohol use: No  . Drug use: No  . Sexual activity: Not on file  Lifestyle  . Physical activity    Days per week: Not on file    Minutes per session: Not on file  . Stress: Not on file  Relationships  . Social Herbalist on phone: Not on file    Gets together: Not on file    Attends religious service: Not on file    Active member of club or organization: Not on file    Attends meetings of clubs or organizations: Not on file    Relationship status: Not on file  Other Topics Concern  . Not on file  Social History Narrative   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[employer] -   Married, lives with spouse - no children     Observations/Objective: Appears well in NAD When she points the pain in her left shoulder it is the anterior-lateral shoulder and it is tender to touch  Assessment and Plan:  See Problem List for Assessment and Plan of chronic medical problems.   Follow Up Instructions:    I discussed the assessment and treatment plan  with the patient. The patient was provided an opportunity to ask questions and all were answered. The patient agreed with the plan and demonstrated an understanding of the instructions.   The patient was advised to call back or seek an in-person evaluation if the symptoms worsen or if the condition fails to improve as anticipated.    Binnie Rail, MD

## 2018-12-30 LAB — RHEUMATOID FACTOR: Rheumatoid fact SerPl-aCnc: <14 IU/mL (ref <14)

## 2018-12-30 LAB — ANA: Anti Nuclear Antibody (ANA): NEGATIVE

## 2018-12-30 LAB — CYCLIC CITRUL PEPTIDE ANTIBODY, IGG: Cyclic Citrullin Peptide Ab: <16 UNITS

## 2018-12-31 ENCOUNTER — Encounter: Payer: Self-pay | Admitting: Internal Medicine

## 2018-12-31 DIAGNOSIS — M24112 Other articular cartilage disorders, left shoulder: Secondary | ICD-10-CM | POA: Diagnosis not present

## 2018-12-31 DIAGNOSIS — M7502 Adhesive capsulitis of left shoulder: Secondary | ICD-10-CM | POA: Diagnosis not present

## 2019-01-05 MED FILL — AMPICILLIN TR 500 MG CAP: 500 | 90 days supply | Qty: 90 | Fill #1

## 2019-01-06 ENCOUNTER — Ambulatory Visit
Admission: RE | Admit: 2019-01-06 | Discharge: 2019-01-06 | Disposition: A | Discharge status: Home / Self Care | Payer: 59 | Source: Ambulatory Visit | Attending: Obstetrics and Gynecology | Admitting: Obstetrics and Gynecology

## 2019-01-06 ENCOUNTER — Other Ambulatory Visit: Payer: Self-pay

## 2019-01-06 DIAGNOSIS — M8589 Other specified disorders of bone density and structure, multiple sites: Secondary | ICD-10-CM | POA: Diagnosis not present

## 2019-01-06 DIAGNOSIS — M8580 Other specified disorders of bone density and structure, unspecified site: Secondary | ICD-10-CM

## 2019-01-06 DIAGNOSIS — Z780 Asymptomatic menopausal state: Secondary | ICD-10-CM | POA: Diagnosis not present

## 2019-01-09 ENCOUNTER — Other Ambulatory Visit: Payer: Self-pay

## 2019-01-09 ENCOUNTER — Ambulatory Visit: Payer: 59 | Attending: Sports Medicine | Admitting: Physical Therapy

## 2019-01-09 DIAGNOSIS — G8929 Other chronic pain: Secondary | ICD-10-CM | POA: Insufficient documentation

## 2019-01-09 DIAGNOSIS — M25512 Pain in left shoulder: Secondary | ICD-10-CM | POA: Insufficient documentation

## 2019-01-09 NOTE — Therapy (Signed)
[place], [place], [address] Phone: [phone]   Fax:  [phone]  Physical Therapy Evaluation  Patient Details  Name: MIAYA LAFONTANT MRN: [id number] Date of Birth: 06/11/1960 Referring Provider (PT): Dr. Edmonia Lynch   Encounter Date: 01/09/2019    Past Medical History:  Diagnosis Date  . Arthritis   . GERD (gastroesophageal reflux disease)   . History of colon polyps 2006   adenomatous, no polyps in 2011  . History of hiatal hernia   . History of kidney stones   . Low blood pressure    occ.  . Migraines    decreased since menopause  . Osteopenia    dexa 08/2010: -2.3 L fem  . PONV (postoperative nausea and vomiting)    only with Demerol  . Skin cancer    Middle of chest  . Urine incontinence     Past Surgical History:  Procedure Laterality Date  . ABDOMINAL HYSTERECTOMY  2006  . BREAST EXCISIONAL BIOPSY Bilateral   . BREAST SURGERY     breast biopsy x's 2 (L) 92 and (R) 98  . COLONOSCOPY    . COLONOSCOPY WITH PROPOFOL N/A 02/11/2015   Procedure: COLONOSCOPY WITH PROPOFOL;  Surgeon: Clarene Essex, MD;  Location: [facility] ENDOSCOPY;  Service: Endoscopy;  Laterality: N/A;  . ESOPHAGOGASTRODUODENOSCOPY N/A 02/11/2015   Procedure: ESOPHAGOGASTRODUODENOSCOPY (EGD);  Surgeon: Clarene Essex, MD;  Location: [facility] ENDOSCOPY;  Service: Endoscopy;  Laterality: N/A;  . Laser endometriosis    . SEPTOPLASTY    . SHOULDER ARTHROSCOPY WITH BICEPS TENDON REPAIR Left 10/14/2018   Procedure: SHOULDER ARTHROSCOPY BICEP TENODESIS, SHOULDER DECOMPRESSION  AND SHOULDER MANIPULATION;  Surgeon: Renette Butters, MD;  Location: [place];  Service: Orthopedics;  Laterality: Left;  REGIONAL BLOCK  . TUBAL LIGATION      There were no vitals filed for this visit.                  Objective measurements completed on examination: See above findings.                PT Short Term Goals -  12/23/18 1557      PT SHORT TERM GOAL #1   Title  pt to be I with inital HEP     Status  Achieved      PT SHORT TERM GOAL #2   Title  Pt will utilize heat/ice as needed for post session soreness, pain     Status  Achieved      PT SHORT TERM GOAL #3   Title  Pt will begin to learn good body awareness and scapular position when performing HEP     Status  Achieved        PT Long Term Goals - 12/25/18 1645      PT LONG TERM GOAL #1   Title  Pt will be able to reach to mid low back with L UE for ADLs, dressing and pain <2/10     Status  Partially Met      PT LONG TERM GOAL #2   Title  Pt will increase L shoulder strengtht to >/= 4+/5 in all planes to promote shoulder stability while lifting, carrying items.     Status  Not Met      PT LONG TERM GOAL #3   Title  Pt will be able to lift arm to 120 deg or more with light weight (<5 lbs)  for functional use in  kitchen and at work     Status  Achieved      PT Industry #4   Title  increase FOTO score to predicted level or less limited to demo improvement in function     Status  Not Met      PT LONG TERM GOAL #5   Title  pt to be I with all HEP given as of last visit to maintain and progress current level of function     Status  Achieved             Plan - 01/09/19 0950    Clinical Impression Statement  Mrs Mowers returns to PT today after being recently discharged from PT with a new referral for L shoulder stability. reviewing previous HEP she has exericse to strength the shoulder to maximize stability. pt opted to hold off with PT and focus on home exercises to promote strengthening/stability       Patient will benefit from skilled therapeutic intervention in order to improve the following deficits and impairments:     Visit Diagnosis: 1. Chronic left shoulder pain        Problem List Patient Active Problem List   Diagnosis Date Noted  . Arthralgia 12/29/2018  . Myalgia 12/29/2018  . Thumb pain, left  05/02/2017  . Anxiety 08/13/2016  . Acne 08/01/2015  . Family history of malignant neoplasm of breast 01/04/2014  . Family history of malignant neoplasm of ovary 01/04/2014  . Vitamin D deficiency disease 07/04/2011  . Osteopenia   . GERD (gastroesophageal reflux disease)    Starr Lake PT, DPT, LAT, ATC  01/09/19  9:54 AM      [place] [address] [place], [place], [address] Phone: [phone]   Fax:  [phone]  Name: KAILEIA FLOW MRN: [id number] Date of Birth: 06/10/1960

## 2019-01-12 DIAGNOSIS — Z681 Body mass index (BMI) 19 or less, adult: Secondary | ICD-10-CM | POA: Diagnosis not present

## 2019-01-12 DIAGNOSIS — Z01419 Encounter for gynecological examination (general) (routine) without abnormal findings: Secondary | ICD-10-CM | POA: Diagnosis not present

## 2019-01-23 ENCOUNTER — Encounter

## 2019-01-23 ENCOUNTER — Ambulatory Visit: Payer: 59 | Admitting: Physical Therapy

## 2019-01-26 MED FILL — ESOMEPRAZOLE MAG DR 40 MG C: 40 | 30 days supply | Qty: 30 | Fill #3

## 2019-02-06 ENCOUNTER — Other Ambulatory Visit: Payer: Self-pay | Admitting: Internal Medicine

## 2019-02-06 MED FILL — ESTRADIOL 0.025 MG/24HR PTT: 0.025 | 84 days supply | Qty: 24 | Fill #0

## 2019-02-11 DIAGNOSIS — M24112 Other articular cartilage disorders, left shoulder: Secondary | ICD-10-CM | POA: Diagnosis not present

## 2019-02-18 DIAGNOSIS — N6320 Unspecified lump in the left breast, unspecified quadrant: Secondary | ICD-10-CM | POA: Diagnosis not present

## 2019-02-18 MED FILL — ESTRADIOL 0.05 MG/24HR PTTW: 0.05 | 84 days supply | Qty: 24 | Fill #0

## 2019-02-19 ENCOUNTER — Other Ambulatory Visit: Payer: Self-pay | Admitting: Obstetrics and Gynecology

## 2019-02-19 DIAGNOSIS — N63 Unspecified lump in unspecified breast: Secondary | ICD-10-CM

## 2019-02-25 MED FILL — ESOMEPRAZOLE MAG DR 40 MG C: 40 | 30 days supply | Qty: 30 | Fill #0

## 2019-02-26 ENCOUNTER — Other Ambulatory Visit: Payer: 59

## 2019-02-27 ENCOUNTER — Ambulatory Visit
Admission: RE | Admit: 2019-02-27 | Discharge: 2019-02-27 | Disposition: A | Discharge status: Home / Self Care | Payer: 59 | Source: Ambulatory Visit | Attending: Obstetrics and Gynecology | Admitting: Obstetrics and Gynecology

## 2019-02-27 ENCOUNTER — Other Ambulatory Visit: Payer: Self-pay

## 2019-02-27 DIAGNOSIS — R922 Inconclusive mammogram: Secondary | ICD-10-CM | POA: Diagnosis not present

## 2019-02-27 DIAGNOSIS — N63 Unspecified lump in unspecified breast: Secondary | ICD-10-CM

## 2019-02-27 DIAGNOSIS — N6012 Diffuse cystic mastopathy of left breast: Secondary | ICD-10-CM | POA: Diagnosis not present

## 2019-03-04 ENCOUNTER — Other Ambulatory Visit: Payer: Self-pay

## 2019-03-04 MED ORDER — ESOMEPRAZOLE MAGNESIUM 40 MG PO CPDR: 40.0000 mg | DELAYED_RELEASE_CAPSULE | Freq: Every day | ORAL | 2 refills | Status: DC

## 2019-03-05 ENCOUNTER — Encounter: Payer: Self-pay | Admitting: Internal Medicine

## 2019-03-21 MED FILL — ESOMEPRAZOLE MAG DR 40 MG C: 40 | 30 days supply | Qty: 30 | Fill #1

## 2019-03-30 ENCOUNTER — Encounter: Payer: Self-pay | Admitting: Internal Medicine

## 2019-03-30 ENCOUNTER — Ambulatory Visit: Payer: 59 | Admitting: Internal Medicine

## 2019-03-30 VITALS — BP 104/62 | HR 76 | Temp 98.3°F | Ht 63.5 in | Wt 113.0 lb

## 2019-03-30 DIAGNOSIS — Z8371 Family history of colonic polyps: Secondary | ICD-10-CM

## 2019-03-30 DIAGNOSIS — K219 Gastro-esophageal reflux disease without esophagitis: Secondary | ICD-10-CM | POA: Diagnosis not present

## 2019-03-30 MED ORDER — ESOMEPRAZOLE MAGNESIUM 40 MG PO CPDR: 40.0000 mg | DELAYED_RELEASE_CAPSULE | Freq: Every day | ORAL | 3 refills | Status: DC

## 2019-03-30 NOTE — Progress Notes (Signed)
HISTORY OF PRESENT ILLNESS:  Katherine Vaughan is a 58 y.o. 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  She has a history of chronic GERD and a strong family history of colon polyps in multiple first-degree relatives less than age 29 for which she undergoes regular colonoscopy surveillance (last examination elsewhere [facility] September 2016).  For the patient's reflux symptoms she is maintained on Nexium 40 mg daily.  She presents today for follow-up.  She is pleased to report that on this regimen her symptoms are well controlled.  No dysphagia except for occasional trouble with pills.  No Barrett's on previous upper endoscopy September 2016.  GI review of systems is otherwise negative.  She does request medication refill.  Review of outside laboratories from July 2020 finds unremarkable comprehensive metabolic panel and CBC.  Hemoglobin 14.3.  Borderline platelets 130,000.  Review of x-ray file shows abdominal ultrasound from October 2015 was normal  REVIEW OF SYSTEMS:  All non-GI ROS negative unless otherwise stated in the HPI except for arthritis  Past Medical History:  Diagnosis Date  . Arthritis   . GERD (gastroesophageal reflux disease)   . History of colon polyps 2006   adenomatous, no polyps in 2011  . History of hiatal hernia   . History of kidney stones   . Low blood pressure    occ.  . Migraines    decreased since menopause  . Osteopenia    dexa 08/2010: -2.3 L fem  . PONV (postoperative nausea and vomiting)    only with Demerol  . Skin cancer    Middle of chest  . Urine incontinence     Past Surgical History:  Procedure Laterality Date  . ABDOMINAL HYSTERECTOMY  2006  . BREAST EXCISIONAL BIOPSY Bilateral   . BREAST SURGERY     breast biopsy x's 2 (L) 92 and (R) 98  . COLONOSCOPY    . COLONOSCOPY WITH PROPOFOL N/A 02/11/2015   Procedure: COLONOSCOPY WITH  PROPOFOL;  Surgeon: Clarene Essex, MD;  Location: [facility] ENDOSCOPY;  Service: Endoscopy;  Laterality: N/A;  . ESOPHAGOGASTRODUODENOSCOPY N/A 02/11/2015   Procedure: ESOPHAGOGASTRODUODENOSCOPY (EGD);  Surgeon: Clarene Essex, MD;  Location: [facility] ENDOSCOPY;  Service: Endoscopy;  Laterality: N/A;  . Laser endometriosis    . SEPTOPLASTY    . SHOULDER ARTHROSCOPY WITH BICEPS TENDON REPAIR Left 10/14/2018   Procedure: SHOULDER ARTHROSCOPY BICEP TENODESIS, SHOULDER DECOMPRESSION  AND SHOULDER MANIPULATION;  Surgeon: Renette Butters, MD;  Location: [place];  Service: Orthopedics;  Laterality: Left;  REGIONAL BLOCK  . TUBAL LIGATION      Social History Sorrel Hartstein  reports that she has never smoked. She has never used smokeless tobacco. She reports that she does not drink alcohol or use drugs.  family history includes Arthritis in her father and mother; Atrial fibrillation in her father; Breast cancer (age of onset: 28) in her sister; COPD in her father; Colon polyps in her father; Diabetes in her maternal grandfather; Diverticulitis in her brother, father, and mother; Diverticulitis (age of onset: 27) in her sister; Heart disease in her father and maternal grandfather; Hyperlipidemia in her maternal grandfather; Kidney cancer (age of onset: 25) in her sister; Ovarian cancer (age of onset: 67) in an other family member; Pancreatic cancer (age of onset: 42) in her paternal uncle; Stomach cancer (age of onset: 67) in her paternal uncle; Stroke in an other family member.  Allergies  Allergen Reactions  . Demerol   . Percocet [Oxycodone-Acetaminophen]   . Sulfa Antibiotics        PHYSICAL EXAMINATION: Vital signs: BP 104/62   Pulse 76   Temp 98.3 F (36.8 C)   Ht 5' 3.5" (1.613 m)   Wt 113 lb (51.3 kg)   BMI 19.70 kg/m   Constitutional: generally well-appearing, no acute distress Psychiatric: alert and oriented x3, cooperative Eyes: extraocular movements intact, anicteric, conjunctiva  pink Mouth: oral pharynx moist, no lesions Neck: supple no lymphadenopathy Cardiovascular: heart regular rate and rhythm, no murmur Lungs: clear to auscultation bilaterally Abdomen: soft, nontender, nondistended, no obvious ascites, no peritoneal signs, normal bowel sounds, no organomegaly Rectal: Omitted Extremities: no clubbing, cyanosis, or lower extremity edema bilaterally Skin: no lesions on visible extremities Neuro: No focal deficits.  Cranial nerves intact  ASSESSMENT:  1.  GERD.  Requiring chronic PPI for symptom control.  No Barrett's on examination 2016 2.  Family history of colon polyps in multiple first-degree relatives listed age 84.  Negative exam 2016   PLAN:  1.  Reflux precautions 2.  Refill Nexium 40 mg daily.  1 year of refills.  We discussed medication risks in detail 3.  Routine office follow-up 1 year 4.  Due for screening colonoscopy in 1 year 5.  Interval follow-up as needed

## 2019-03-30 NOTE — Patient Instructions (Signed)
We have sent the following medications to your pharmacy for you to pick up at your convenience:  Nexium   Please follow up in one year.  You will be due for a colonoscopy in one year

## 2019-04-01 DIAGNOSIS — N6459 Other signs and symptoms in breast: Secondary | ICD-10-CM | POA: Diagnosis not present

## 2019-04-06 MED FILL — AMPICILLIN TR 500 MG CAP: 500 | 90 days supply | Qty: 90 | Fill #2

## 2019-04-20 MED FILL — ESOMEPRAZOLE MAG DR 40 MG C: 40 | 90 days supply | Qty: 90 | Fill #0

## 2019-05-08 MED FILL — ESTRADIOL 0.05 MG/24HR PTTW: 0.05 | 84 days supply | Qty: 24 | Fill #1

## 2019-06-08 ENCOUNTER — Other Ambulatory Visit: Payer: Self-pay | Admitting: Obstetrics and Gynecology

## 2019-06-08 DIAGNOSIS — Z1231 Encounter for screening mammogram for malignant neoplasm of breast: Secondary | ICD-10-CM

## 2019-07-07 MED FILL — AMPICILLIN TR 500 MG CAP: 500 | 90 days supply | Qty: 90 | Fill #0

## 2019-07-13 MED FILL — ESOMEPRAZOLE MAG DR 40 MG C: 40 | 90 days supply | Qty: 90 | Fill #1

## 2019-07-15 DIAGNOSIS — H524 Presbyopia: Secondary | ICD-10-CM | POA: Diagnosis not present

## 2019-07-22 ENCOUNTER — Ambulatory Visit: Payer: 59

## 2019-07-29 ENCOUNTER — Ambulatory Visit: Payer: 59

## 2019-07-30 MED FILL — ESTRADIOL 0.05 MG/24HR PTTW: 0.05 | 84 days supply | Qty: 24 | Fill #2

## 2019-08-04 ENCOUNTER — Ambulatory Visit: Payer: 59

## 2019-08-14 ENCOUNTER — Telehealth: Payer: Self-pay | Admitting: Internal Medicine

## 2019-08-14 DIAGNOSIS — H9313 Tinnitus, bilateral: Secondary | ICD-10-CM

## 2019-08-14 NOTE — Telephone Encounter (Signed)
New Message:   Pt is calling to see if she can get an order to go see someone for ringing in her ears. Pt states this is not something she discussed with Dr. Quay Burow before but didn't think it was her specialty. I advised the patient that she would probably like to see her first but the pt insisted that I just send a message around first. Please advise.

## 2019-08-14 NOTE — Telephone Encounter (Signed)
Can this be a referral or does she need to see a provider first. You are booked today and Monday.

## 2019-08-14 NOTE — Telephone Encounter (Signed)
Referral ordered

## 2019-08-19 ENCOUNTER — Ambulatory Visit
Admission: RE | Admit: 2019-08-19 | Discharge: 2019-08-19 | Disposition: A | Discharge status: Home / Self Care | Payer: 59 | Source: Ambulatory Visit | Attending: Obstetrics and Gynecology | Admitting: Obstetrics and Gynecology

## 2019-08-19 ENCOUNTER — Other Ambulatory Visit: Payer: Self-pay

## 2019-08-19 DIAGNOSIS — Z1231 Encounter for screening mammogram for malignant neoplasm of breast: Secondary | ICD-10-CM

## 2019-08-26 MED FILL — CLINDAMYCIN HCL 150 MG CAPS: 150 | 9 days supply | Qty: 30 | Fill #0

## 2019-09-09 ENCOUNTER — Other Ambulatory Visit: Payer: Self-pay | Admitting: Obstetrics and Gynecology

## 2019-09-09 ENCOUNTER — Other Ambulatory Visit (HOSPITAL_COMMUNITY): Payer: Self-pay | Admitting: Obstetrics and Gynecology

## 2019-09-09 DIAGNOSIS — Z803 Family history of malignant neoplasm of breast: Secondary | ICD-10-CM

## 2019-09-09 MED FILL — CLINDAMYCIN HCL 150 MG CAPS: 150 | 9 days supply | Qty: 30 | Fill #1

## 2019-09-11 ENCOUNTER — Other Ambulatory Visit: Payer: Self-pay

## 2019-09-11 ENCOUNTER — Ambulatory Visit (INDEPENDENT_AMBULATORY_CARE_PROVIDER_SITE_OTHER): Payer: 59 | Admitting: Otolaryngology

## 2019-09-11 DIAGNOSIS — H9313 Tinnitus, bilateral: Secondary | ICD-10-CM | POA: Diagnosis not present

## 2019-09-11 NOTE — Progress Notes (Signed)
HPI: Katherine Vaughan is a 59 y.o. female who presents for evaluation of tinnitus.  Patient apparently has had intermittent tinnitus for a number of years but over the past 6 months the tinnitus has become more chronic.  She denies loud noise exposure.  She had a recent hearing test performed in reasonable that she brings with her.  On review of this she had essentially normal hearing (approximately 25 dB) in both ears up to [id number] frequency but in the upper frequencies had a downsloping sensorineural hearing loss down to 50-60 dB.  Past Medical History:  Diagnosis Date  . Arthritis   . GERD (gastroesophageal reflux disease)   . History of colon polyps 2006   adenomatous, no polyps in 2011  . History of hiatal hernia   . History of kidney stones   . Low blood pressure    occ.  . Migraines    decreased since menopause  . Osteopenia    dexa 08/2010: -2.3 L fem  . PONV (postoperative nausea and vomiting)    only with Demerol  . Skin cancer    Middle of chest  . Urine incontinence    Past Surgical History:  Procedure Laterality Date  . ABDOMINAL HYSTERECTOMY  2006  . BREAST EXCISIONAL BIOPSY Bilateral   . BREAST SURGERY     breast biopsy x's 2 (L) 92 and (R) 98  . COLONOSCOPY    . COLONOSCOPY WITH PROPOFOL N/A 02/11/2015   Procedure: COLONOSCOPY WITH PROPOFOL;  Surgeon: Clarene Essex, MD;  Location: [facility] ENDOSCOPY;  Service: Endoscopy;  Laterality: N/A;  . ESOPHAGOGASTRODUODENOSCOPY N/A 02/11/2015   Procedure: ESOPHAGOGASTRODUODENOSCOPY (EGD);  Surgeon: Clarene Essex, MD;  Location: [facility] ENDOSCOPY;  Service: Endoscopy;  Laterality: N/A;  . Laser endometriosis    . SEPTOPLASTY    . SHOULDER ARTHROSCOPY WITH BICEPS TENDON REPAIR Left 10/14/2018   Procedure: SHOULDER ARTHROSCOPY BICEP TENODESIS, SHOULDER DECOMPRESSION  AND SHOULDER MANIPULATION;  Surgeon: Renette Butters, MD;  Location: [place];  Service: Orthopedics;  Laterality: Left;  REGIONAL BLOCK  . TUBAL LIGATION      Social History   Socioeconomic History  . Marital status: Married    Spouse name: Not on file  . Number of children: 0  . Years of education: Not on file  . Highest education level: Not on file  Occupational History  . Occupation: nurse  Tobacco Use  . Smoking status: Never Smoker  . Smokeless tobacco: Never Used  Substance and Sexual Activity  . Alcohol use: No  . Drug use: No  . Sexual activity: Not on file  Other Topics Concern  . 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 Difficulty of Paying Living Expenses:   Food Insecurity:   . Worried About Charity fundraiser in the Last Year:   . Arboriculturist in the Last Year:   Transportation Needs:   . Film/video editor (Medical):   Marland Kitchen Lack of Transportation (Non-Medical):   Physical Activity:   . Days of Exercise per Week:   . Minutes of Exercise per Session:   Stress:   . Feeling of Stress :   Social Connections:   . Frequency of Communication with Friends and Family:   . Frequency of Social Gatherings with Friends and Family:   . Attends Religious Services:   .  Active Member of Clubs or Organizations:   . Attends Archivist Meetings:   Marland Kitchen Marital Status:    Family History  Problem Relation Age of Onset  . Arthritis Mother   . Diverticulitis Mother   . Arthritis Father   . COPD Father   . Atrial fibrillation Father   . Heart disease Father   . Colon polyps Father   . Diverticulitis Father   . Stroke Other   . Breast cancer Sister 68  . Kidney cancer Sister 70  . Diverticulitis Sister 30       s/p colectomy  . Diverticulitis Brother   . Pancreatic cancer Paternal Uncle 61  . Stomach cancer Paternal Uncle 77  . Ovarian cancer Other 72       mat great aunt through [facility] with ovarian  . Diabetes Maternal Grandfather   . Heart disease Maternal Grandfather   .  Hyperlipidemia Maternal Grandfather   . Colon cancer Neg Hx   . Esophageal cancer Neg Hx    Allergies  Allergen Reactions  . Demerol   . Percocet [Oxycodone-Acetaminophen]   . Sulfa Antibiotics    Prior to Admission medications   Medication Sig Start Date End Date Taking? Authorizing Provider  acetaminophen (TYLENOL) 500 MG tablet Take 500 mg by mouth every 6 (six) hours as needed.    [provider]  ampicillin (PRINCIPEN) 500 MG capsule Take 500 mg by mouth every other day.      [provider]  Ascorbic Acid (VITAMIN C) 1000 MG tablet Take 1,000 mg by mouth daily.    [provider]  Calcium Carbonate-Vitamin D (CALCIUM + D PO) Take 500 mg by mouth 2 (two) times daily.     [provider]  Cholecalciferol (VITAMIN D3) 2000 UNITS TABS Take by mouth daily.      [provider]  esomeprazole (NEXIUM) 40 MG capsule Take 1 capsule (40 mg total) by mouth daily. 03/30/19   Irene Shipper, MD  estradiol (VIVELLE-DOT) 0.05 MG/24HR patch APPLY 1 PATCH TWICE WEEKLY AS DIRECTED 06/28/17   [provider]  FIBER SELECT GUMMIES PO Take 2 tablets by mouth at bedtime.    [provider]  ibuprofen (ADVIL) 400 MG tablet Take 400 mg by mouth every 6 (six) hours as needed.    [provider]  Multiple Vitamin (MULTIVITAMIN) tablet Take 1 tablet by mouth daily.      [provider]  Omega-3 Fatty Acids (FISH OIL ULTRA) 1400 MG CAPS Take 1 tablet by mouth daily.    [provider]  tretinoin (RETIN-A) 0.05 % cream Apply topically at bedtime.  03/11/17   [provider]     Positive ROS: Otherwise negative  All other systems have been reviewed and were otherwise negative with the exception of those mentioned in the HPI and as above.  Physical Exam: Constitutional: Alert, well-appearing, no acute distress Ears: External ears without lesions or tenderness. Ear canals are clear bilaterally with intact, clear  TMs bilaterally with good mobility on pneumatic otoscopy. Nasal: External nose without lesions Clear nasal passages Oral: Lips and gums without lesions. Tongue and palate mucosa without lesions. Posterior oropharynx clear. Neck: No palpable adenopathy or masses Respiratory: Breathing comfortably  Skin: No facial/neck lesions or rash noted.  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  Plan: Reviewed with patient concerning limited treatment options for tinnitus.  I discussed with her concerning using masking noise when the tinnitus is bad. Cautioned her  about using ear protection when around loud noise.Hanley Seamen her samples of Lipo flavonoid to try as this is beneficial in some people with tinnitus. She will follow-up as needed.  Radene Journey, MD

## 2019-09-17 ENCOUNTER — Encounter (INDEPENDENT_AMBULATORY_CARE_PROVIDER_SITE_OTHER): Payer: Self-pay

## 2019-09-23 ENCOUNTER — Other Ambulatory Visit: Payer: Self-pay

## 2019-09-23 ENCOUNTER — Ambulatory Visit (HOSPITAL_COMMUNITY)
Admission: RE | Admit: 2019-09-23 | Discharge: 2019-09-23 | Disposition: A | Discharge status: Home / Self Care | Payer: 59 | Source: Ambulatory Visit | Attending: Obstetrics and Gynecology | Admitting: Obstetrics and Gynecology

## 2019-09-23 DIAGNOSIS — Z803 Family history of malignant neoplasm of breast: Secondary | ICD-10-CM | POA: Insufficient documentation

## 2019-09-23 DIAGNOSIS — N6489 Other specified disorders of breast: Secondary | ICD-10-CM | POA: Diagnosis not present

## 2019-09-23 DIAGNOSIS — N6002 Solitary cyst of left breast: Secondary | ICD-10-CM | POA: Diagnosis not present

## 2019-09-23 MED ORDER — GADOBUTROL 1 MMOL/ML IV SOLN
5.0000 mL | Freq: Once | INTRAVENOUS | Status: AC | PRN
  Administered 2019-09-23: 5 mL via INTRAVENOUS

## 2019-10-22 MED FILL — ESTRADIOL 0.05 MG/24HR PTTW: 0.05 | 84 days supply | Qty: 24 | Fill #3

## 2019-10-28 DIAGNOSIS — L700 Acne vulgaris: Secondary | ICD-10-CM | POA: Diagnosis not present

## 2019-10-28 DIAGNOSIS — D2261 Melanocytic nevi of right upper limb, including shoulder: Secondary | ICD-10-CM | POA: Diagnosis not present

## 2019-10-28 DIAGNOSIS — D1801 Hemangioma of skin and subcutaneous tissue: Secondary | ICD-10-CM | POA: Diagnosis not present

## 2019-10-28 DIAGNOSIS — D225 Melanocytic nevi of trunk: Secondary | ICD-10-CM | POA: Diagnosis not present

## 2019-10-28 DIAGNOSIS — D2262 Melanocytic nevi of left upper limb, including shoulder: Secondary | ICD-10-CM | POA: Diagnosis not present

## 2019-11-17 MED FILL — CLINDAMYCIN HCL 150 MG CAPS: 150 | 9 days supply | Qty: 30 | Fill #0

## 2019-12-08 MED FILL — predniSONE 50 MG TABS: 50 | 5 days supply | Qty: 5 | Fill #0

## 2019-12-08 MED FILL — CLOBETASOL PROPIONATE 0.05: 0.05 | 14 days supply | Qty: 30 | Fill #0

## 2019-12-12 NOTE — Patient Instructions (Addendum)
Blood work was ordered.    All other Health Maintenance issues reviewed.   All recommended immunizations and age-appropriate screenings are up-to-date or discussed.  No immunization administered today.   Medications reviewed and updated.  Changes include :   none   Please followup in 1 year    Plantar Fasciitis  Plantar fasciitis is a painful foot condition that affects the heel. It occurs when the band of tissue that connects the toes to the heel bone (plantar fascia) becomes irritated. This can happen as the result of exercising too much or doing other repetitive activities (overuse injury). The pain from plantar fasciitis can range from mild irritation to severe pain that makes it difficult to walk or move. The pain is usually worse in the morning after sleeping, or after sitting or lying down for a while. Pain may also be worse after long periods of walking or standing. What are the causes? This condition may be caused by:  Standing for long periods of time.  Wearing shoes that do not have good arch support.  Doing activities that put stress on joints (high-impact activities), including running, aerobics, and ballet.  Being overweight.  An abnormal way of walking (gait).  Tight muscles in the back of your lower leg (calf).  High arches in your feet.  Starting a new athletic activity. What are the signs or symptoms? The main symptom of this condition is heel pain. Pain may:  Be worse with first steps after a time of rest, especially in the morning after sleeping or after you have been sitting or lying down for a while.  Be worse after long periods of standing still.  Decrease after 30-45 minutes of activity, such as gentle walking. How is this diagnosed? This condition may be diagnosed based on your medical history and your symptoms. Your health care provider may ask questions about your activity level. Your health care provider will do a physical exam to check  for:  A tender area on the bottom of your foot.  A high arch in your foot.  Pain when you move your foot.  Difficulty moving your foot. You may have imaging tests to confirm the diagnosis, such as:  X-rays.  Ultrasound.  MRI. How is this treated? Treatment for plantar fasciitis depends on how severe your condition is. Treatment may include:  Rest, ice, applying pressure (compression), and raising the affected foot (elevation). This may be called RICE therapy. Your health care provider may recommend RICE therapy along with over-the-counter pain medicines to manage your pain.  Exercises to stretch your calves and your plantar fascia.  A splint that holds your foot in a stretched, upward position while you sleep (night splint).  Physical therapy to relieve symptoms and prevent problems in the future.  Injections of steroid medicine (cortisone) to relieve pain and inflammation.  Stimulating your plantar fascia with electrical impulses (extracorporeal shock wave therapy). This is usually the last treatment option before surgery.  Surgery, if other treatments have not worked after 12 months. Follow these instructions at home:  Managing pain, stiffness, and swelling  If directed, put ice on the painful area: ? Put ice in a plastic bag, or use a frozen bottle of water. ? Place a towel between your skin and the bag or bottle. ? Roll the bottom of your foot over the bag or bottle. ? Do this for 20 minutes, 2-3 times a day.  Wear athletic shoes that have air-sole or gel-sole cushions, or try wearing soft  shoe inserts that are designed for plantar fasciitis.  Raise (elevate) your foot above the level of your heart while you are sitting or lying down. Activity  Avoid activities that cause pain. Ask your health care provider what activities are safe for you.  Do physical therapy exercises and stretches as told by your health care provider.  Try activities and forms of exercise  that are easier on your joints (low-impact). Examples include swimming, water aerobics, and biking. General instructions  Take over-the-counter and prescription medicines only as told by your health care provider.  Wear a night splint while sleeping, if told by your health care provider. Loosen the splint if your toes tingle, become numb, or turn cold and blue.  Maintain a healthy weight, or work with your health care provider to lose weight as needed.  Keep all follow-up visits as told by your health care provider. This is important. Contact a health care provider if you:  Have symptoms that do not go away after caring for yourself at home.  Have pain that gets worse.  Have pain that affects your ability to move or do your daily activities. Summary  Plantar fasciitis is a painful foot condition that affects the heel. It occurs when the band of tissue that connects the toes to the heel bone (plantar fascia) becomes irritated.  The main symptom of this condition is heel pain that may be worse after exercising too much or standing still for a long time.  Treatment varies, but it usually starts with rest, ice, compression, and elevation (RICE therapy) and over-the-counter medicines to manage pain. This information is not intended to replace advice given to you by your health care provider. Make sure you discuss any questions you have with your health care provider. Document Revised: 05/03/2017 Document Reviewed: 03/18/2017 Elsevier Patient Education  2020 [place] Maintenance, Female Adopting a healthy lifestyle and getting preventive care are important in promoting health and wellness. Ask your health care provider about:  The right schedule for you to have regular tests and exams.  Things you can do on your own to prevent diseases and keep yourself healthy. What should I know about diet, weight, and exercise? Eat a healthy diet   Eat a diet that includes  plenty of vegetables, fruits, low-fat dairy products, and lean protein.  Do not eat a lot of foods that are high in solid fats, added sugars, or sodium. Maintain a healthy weight Body mass index (BMI) is used to identify weight problems. It estimates body fat based on height and weight. Your health care provider can help determine your BMI and help you achieve or maintain a healthy weight. Get regular exercise Get regular exercise. This is one of the most important things you can do for your health. Most adults should:  Exercise for at least 150 minutes each week. The exercise should increase your heart rate and make you sweat (moderate-intensity exercise).  Do strengthening exercises at least twice a week. This is in addition to the moderate-intensity exercise.  Spend less time sitting. Even light physical activity can be beneficial. Watch cholesterol and blood lipids Have your blood tested for lipids and cholesterol at 61 years of age, then have this test every 5 years. Have your cholesterol levels checked more often if:  Your lipid or cholesterol levels are high.  You are older than 61 years of age.  You are at high risk for heart disease. What should I know about  cancer screening? Depending on your health history and family history, you may need to have cancer screening at various ages. This may include screening for:  Breast cancer.  Cervical cancer.  Colorectal cancer.  Skin cancer.  Lung cancer. What should I know about heart disease, diabetes, and high blood pressure? Blood pressure and heart disease  High blood pressure causes heart disease and increases the risk of stroke. This is more likely to develop in people who have high blood pressure readings, are of African descent, or are overweight.  Have your blood pressure checked: ? Every 3-5 years if you are 20-93 years of age. ? Every year if you are 19 years old or older. Diabetes Have regular diabetes screenings.  This checks your fasting blood sugar level. Have the screening done:  Once every three years after age 27 if you are at a normal weight and have a low risk for diabetes.  More often and at a younger age if you are overweight or have a high risk for diabetes. What should I know about preventing infection? Hepatitis B If you have a higher risk for hepatitis B, you should be screened for this virus. Talk with your health care provider to find out if you are at risk for hepatitis B infection. Hepatitis C Testing is recommended for:  Everyone born from [birth year] through 1967.  Anyone with known risk factors for hepatitis C. Sexually transmitted infections (STIs)  Get screened for STIs, including gonorrhea and chlamydia, if: ? You are sexually active and are younger than 61 years of age. ? You are older than 61 years of age and your health care provider tells you that you are at risk for this type of infection. ? Your sexual activity has changed since you were last screened, and you are at increased risk for chlamydia or gonorrhea. Ask your health care provider if you are at risk.  Ask your health care provider about whether you are at high risk for HIV. Your health care provider may recommend a prescription medicine to help prevent HIV infection. If you choose to take medicine to prevent HIV, you should first get tested for HIV. You should then be tested every 3 months for as long as you are taking the medicine. Pregnancy  If you are about to stop having your period (premenopausal) and you may become pregnant, seek counseling before you get pregnant.  Take 400 to 800 micrograms (mcg) of folic acid every day if you become pregnant.  Ask for birth control (contraception) if you want to prevent pregnancy. Osteoporosis and menopause Osteoporosis is a disease in which the bones lose minerals and strength with aging. This can result in bone fractures. If you are 49 years old or older, or if you are at  risk for osteoporosis and fractures, ask your health care provider if you should:  Be screened for bone loss.  Take a calcium or vitamin D supplement to lower your risk of fractures.  Be given hormone replacement therapy (HRT) to treat symptoms of menopause. Follow these instructions at home: Lifestyle  Do not use any products that contain nicotine or tobacco, such as cigarettes, e-cigarettes, and chewing tobacco. If you need help quitting, ask your health care provider.  Do not use street drugs.  Do not share needles.  Ask your health care provider for help if you need support or information about quitting drugs. Alcohol use  Do not drink alcohol if: ? Your health care provider tells you not to  drink. ? You are pregnant, may be pregnant, or are planning to become pregnant.  If you drink alcohol: ? Limit how much you use to 0-1 drink a day. ? Limit intake if you are breastfeeding.  Be aware of how much alcohol is in your drink. In the [place] one drink equals one 12 oz bottle of beer (355 mL), one 5 oz glass of wine (148 mL), or one 1 oz glass of hard liquor (44 mL). General instructions  Schedule regular health, dental, and eye exams.  Stay current with your vaccines.  Tell your health care provider if: ? You often feel depressed. ? You have ever been abused or do not feel safe at home. Summary  Adopting a healthy lifestyle and getting preventive care are important in promoting health and wellness.  Follow your health care provider's instructions about healthy diet, exercising, and getting tested or screened for diseases.  Follow your health care provider's instructions on monitoring your cholesterol and blood pressure. This information is not intended to replace advice given to you by your health care provider. Make sure you discuss any questions you have with your health care provider. Document Revised: 05/14/2018 Document Reviewed: 05/14/2018 Elsevier Patient  Education  2020 [employer].

## 2019-12-12 NOTE — Progress Notes (Signed)
Subjective:    Patient ID: Katherine Vaughan, female    DOB: 09-02-1960, 59 y.o.   MRN: [id number]  HPI She is here for a physical exam.    She has been fighting poison oak for the past three weeks. She did an Designer, jewellery.  She takes zyrtec and benadryl.  She is using steroid cream.  It is finally getting better.   B/l heel pain.  It hurts more when she first gets up and walks - it is better the more she walks.     Medications and allergies reviewed with patient and updated if appropriate.  Patient Active Problem List   Diagnosis Date Noted  . Arthralgia 12/29/2018  . Myalgia 12/29/2018  . Thumb pain, left 05/02/2017  . Anxiety 08/13/2016  . Acne 08/01/2015  . Family history of malignant neoplasm of breast 01/04/2014  . Family history of malignant neoplasm of ovary 01/04/2014  . Vitamin D deficiency disease 07/04/2011  . Osteopenia   . GERD (gastroesophageal reflux disease)     Current Outpatient Medications on File Prior to Visit  Medication Sig Dispense Refill  . acetaminophen (TYLENOL) 500 MG tablet Take 500 mg by mouth every 6 (six) hours as needed.    Marland Kitchen ampicillin (PRINCIPEN) 500 MG capsule Take 500 mg by mouth every other day.      . Ascorbic Acid (VITAMIN C) 1000 MG tablet Take 1,000 mg by mouth daily.    . Ca Cit Malate-Cholecalciferol (CALCIUM CITRATE MALATE-VIT D) 250-100 MG-UNIT TABS     . Cholecalciferol (VITAMIN D3) 2000 UNITS TABS Take by mouth daily.      . clobetasol ointment (TEMOVATE) 6.41 % Apply 1 application topically 2 (two) times daily.    Marland Kitchen esomeprazole (NEXIUM) 40 MG capsule Take 1 capsule (40 mg total) by mouth daily. 90 capsule 3  . estradiol (VIVELLE-DOT) 0.05 MG/24HR patch APPLY 1 PATCH TWICE WEEKLY AS DIRECTED  3  . FIBER SELECT GUMMIES PO Take 2 tablets by mouth at bedtime.    Marland Kitchen ibuprofen (ADVIL) 400 MG tablet Take 400 mg by mouth every 6 (six) hours as needed.    . Multiple Vitamin (MULTIVITAMIN) tablet Take 1 tablet by mouth daily.      .  Omega-3 Fatty Acids (FISH OIL ULTRA) 1400 MG CAPS Take 1 tablet by mouth daily.    Marland Kitchen tretinoin (RETIN-A) 0.05 % cream Apply topically at bedtime.     . Turmeric (QC TUMERIC COMPLEX PO) Take by mouth.     No current facility-administered medications on file prior to visit.    Past Medical History:  Diagnosis Date  . Arthritis   . GERD (gastroesophageal reflux disease)   . History of colon polyps 2006   adenomatous, no polyps in 2011  . History of hiatal hernia   . History of kidney stones   . Low blood pressure    occ.  . Migraines    decreased since menopause  . Osteopenia    dexa 08/2010: -2.3 L fem  . PONV (postoperative nausea and vomiting)    only with Demerol  . Skin cancer    Middle of chest  . Urine incontinence     Past Surgical History:  Procedure Laterality Date  . ABDOMINAL HYSTERECTOMY  2006  . BREAST EXCISIONAL BIOPSY Bilateral   . BREAST SURGERY     breast biopsy x's 2 (L) 92 and (R) 98  . COLONOSCOPY    . COLONOSCOPY WITH PROPOFOL N/A 02/11/2015   Procedure: COLONOSCOPY  WITH PROPOFOL;  Surgeon: Clarene Essex, MD;  Location: [facility] ENDOSCOPY;  Service: Endoscopy;  Laterality: N/A;  . ESOPHAGOGASTRODUODENOSCOPY N/A 02/11/2015   Procedure: ESOPHAGOGASTRODUODENOSCOPY (EGD);  Surgeon: Clarene Essex, MD;  Location: [facility] ENDOSCOPY;  Service: Endoscopy;  Laterality: N/A;  . Laser endometriosis    . SEPTOPLASTY    . SHOULDER ARTHROSCOPY WITH BICEPS TENDON REPAIR Left 10/14/2018   Procedure: SHOULDER ARTHROSCOPY BICEP TENODESIS, SHOULDER DECOMPRESSION  AND SHOULDER MANIPULATION;  Surgeon: Renette Butters, MD;  Location: [place];  Service: Orthopedics;  Laterality: Left;  REGIONAL BLOCK  . TUBAL LIGATION      Social History   Socioeconomic History  . Marital status: Married    Spouse name: Not on file  . Number of children: 0  . Years of education: Not on file  . Highest education level: Not on file  Occupational History  . Occupation: nurse  Tobacco Use   . Smoking status: Never Smoker  . Smokeless tobacco: Never Used  Vaping Use  . Vaping Use: Never used  Substance and Sexual Activity  . Alcohol use: No  . Drug use: No  . Sexual activity: Not on file  Other Topics Concern  . 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 Difficulty of Paying Living Expenses:   Food Insecurity:   . Worried About Charity fundraiser in the Last Year:   . Arboriculturist in the Last Year:   Transportation Needs:   . Film/video editor (Medical):   Marland Kitchen Lack of Transportation (Non-Medical):   Physical Activity:   . Days of Exercise per Week:   . Minutes of Exercise per Session:   Stress:   . Feeling of Stress :   Social Connections:   . Frequency of Communication with Friends and Family:   . Frequency of Social Gatherings with Friends and Family:   . Attends Religious Services:   . Active Member of Clubs or Organizations:   . Attends Archivist Meetings:   Marland Kitchen Marital Status:     Family History  Problem Relation Age of Onset  . Arthritis Mother   . Diverticulitis Mother   . Arthritis Father   . COPD Father   . Atrial fibrillation Father   . Heart disease Father   . Colon polyps Father   . Diverticulitis Father   . Stroke Other   . Breast cancer Sister 78  . Kidney cancer Sister 3  . Diverticulitis Sister 31       s/p colectomy  . Diverticulitis Brother   . Pancreatic cancer Paternal Uncle 35  . Stomach cancer Paternal Uncle 37  . Ovarian cancer Other 38       mat great aunt through [facility] with ovarian  . Diabetes Maternal Grandfather   . Heart disease Maternal Grandfather   . Hyperlipidemia Maternal Grandfather   . Colon cancer Neg Hx   . Esophageal cancer Neg Hx     Review of Systems  Constitutional: Negative for chills and fever.  HENT: Positive for tinnitus.   Respiratory: Negative  for cough, shortness of breath and wheezing.   Cardiovascular: Negative for chest pain, palpitations and leg swelling.  Gastrointestinal: Negative for abdominal pain, blood in stool, constipation, diarrhea and nausea.       Gerd controlled  Genitourinary: Negative  for dysuria and hematuria.  Musculoskeletal: Positive for back pain (chronic).       B/l heel pain  Skin: Positive for rash (poisin oak on b/l legs).  Neurological: Negative for light-headedness and headaches.  Psychiatric/Behavioral: Negative for dysphoric mood. The patient is not nervous/anxious.        Objective:   Vitals:   12/14/19 0747  BP: 102/70  Pulse: 62  Temp: 98.1 F (36.7 C)  SpO2: 99%   Filed Weights   12/14/19 0747  Weight: 115 lb (52.2 kg)   Body mass index is 20.05 kg/m.  BP Readings from Last 3 Encounters:  12/14/19 102/70  03/30/19 104/62  12/08/18 116/82    Wt Readings from Last 3 Encounters:  12/14/19 115 lb (52.2 kg)  03/30/19 113 lb (51.3 kg)  12/08/18 110 lb 12.8 oz (50.3 kg)     Physical Exam Constitutional: She appears well-developed and well-nourished. No distress.  HENT:  Head: Normocephalic and atraumatic.  Right Ear: External ear normal. Normal ear canal and TM Left Ear: External ear normal.  Normal ear canal and TM Mouth/Throat: Oropharynx is clear and moist.  Eyes: Conjunctivae and EOM are normal.  Neck: Neck supple. No tracheal deviation present. No thyromegaly present.  No carotid bruit  Cardiovascular: Normal rate, regular rhythm and normal heart sounds. No murmur heard.  No edema. Pulmonary/Chest: Effort normal and breath sounds normal. No respiratory distress. She has no wheezes. She has no rales.  Breast: deferred   Abdominal: Soft. She exhibits no distension. There is no tenderness.  Lymphadenopathy: She has no cervical adenopathy.  Skin: Skin is warm and dry. She is not diaphoretic.  Psychiatric: She has a normal mood and affect. Her behavior is normal.         Assessment & Plan:   Physical exam: Screening blood work    ordered Immunizations   Had covid, others up to date Colonoscopy  Up to date  Mammogram  Up to date  Gyn  - Dr Radene Knee - Up to date  Dexa  Up to date  Eye exams  Up to date  Exercise    walking Weight   normal Substance abuse  None Sees derm annually - Dr Ronnald Ramp   See Problem List for Assessment and Plan of chronic medical problems.   This visit occurred during the SARS-CoV-2 public health emergency.  Safety protocols were in place, including screening questions prior to the visit, additional usage of staff PPE, and extensive cleaning of exam room while observing appropriate contact time as indicated for disinfecting solutions.

## 2019-12-14 ENCOUNTER — Encounter: Payer: Self-pay | Admitting: Internal Medicine

## 2019-12-14 ENCOUNTER — Other Ambulatory Visit: Payer: Self-pay

## 2019-12-14 ENCOUNTER — Ambulatory Visit (INDEPENDENT_AMBULATORY_CARE_PROVIDER_SITE_OTHER): Payer: 59 | Admitting: Internal Medicine

## 2019-12-14 VITALS — BP 102/70 | HR 62 | Temp 98.1°F | Ht 63.5 in | Wt 115.0 lb

## 2019-12-14 DIAGNOSIS — M85859 Other specified disorders of bone density and structure, unspecified thigh: Secondary | ICD-10-CM

## 2019-12-14 DIAGNOSIS — E559 Vitamin D deficiency, unspecified: Secondary | ICD-10-CM | POA: Diagnosis not present

## 2019-12-14 DIAGNOSIS — K219 Gastro-esophageal reflux disease without esophagitis: Secondary | ICD-10-CM | POA: Diagnosis not present

## 2019-12-14 DIAGNOSIS — Z0000 Encounter for general adult medical examination without abnormal findings: Secondary | ICD-10-CM

## 2019-12-14 NOTE — Assessment & Plan Note (Signed)
Chronic Taking vitamin D Check level

## 2019-12-14 NOTE — Assessment & Plan Note (Signed)
Chronic dexa up to date Walking regularly Taking calcium and vitamin d

## 2019-12-14 NOTE — Assessment & Plan Note (Signed)
Chronic GERD controlled Continue daily medication  

## 2019-12-15 LAB — CBC WITH DIFFERENTIAL/PLATELET
Absolute Monocytes: 519 cells/uL (ref 200–950)
Basophils Absolute: 42 cells/uL (ref 0–200)
Basophils Relative: 0.8 %
Eosinophils Absolute: 101 cells/uL (ref 15–500)
Eosinophils Relative: 1.9 %
HCT: 43.5 % (ref 35.0–45.0)
Hemoglobin: 14.5 g/dL (ref 11.7–15.5)
Lymphs Abs: 1850 cells/uL (ref 850–3900)
MCH: 30.6 pg (ref 27.0–33.0)
MCHC: 33.3 g/dL (ref 32.0–36.0)
MCV: 91.8 fL (ref 80.0–100.0)
MPV: 13.3 fL — ABNORMAL HIGH (ref 7.5–12.5)
Monocytes Relative: 9.8 %
Neutro Abs: 2788 cells/uL (ref 1500–7800)
Neutrophils Relative %: 52.6 %
Platelets: 142 10*3/uL (ref 140–400)
RBC: 4.74 10*6/uL (ref 3.80–5.10)
RDW: 12.3 % (ref 11.0–15.0)
Total Lymphocyte: 34.9 %
WBC: 5.3 10*3/uL (ref 3.8–10.8)

## 2019-12-15 LAB — COMPREHENSIVE METABOLIC PANEL
AG Ratio: 2 (calc) (ref 1.0–2.5)
ALT: 22 U/L (ref 6–29)
AST: 22 U/L (ref 10–35)
Albumin: 4.3 g/dL (ref 3.6–5.1)
Alkaline phosphatase (APISO): 51 U/L (ref 37–153)
BUN: 18 mg/dL (ref 7–25)
CO2: 29 mmol/L (ref 20–32)
Calcium: 9.3 mg/dL (ref 8.6–10.4)
Chloride: 104 mmol/L (ref 98–110)
Creat: 0.69 mg/dL (ref 0.50–0.99)
Globulin: 2.1 g/dL (calc) (ref 1.9–3.7)
Glucose, Bld: 80 mg/dL (ref 65–99)
Potassium: 3.9 mmol/L (ref 3.5–5.3)
Sodium: 139 mmol/L (ref 135–146)
Total Bilirubin: 0.9 mg/dL (ref 0.2–1.2)
Total Protein: 6.4 g/dL (ref 6.1–8.1)

## 2019-12-15 LAB — VITAMIN D 25 HYDROXY (VIT D DEFICIENCY, FRACTURES): Vit D, 25-Hydroxy: 42 ng/mL (ref 30–100)

## 2019-12-15 LAB — LIPID PANEL
Cholesterol: 190 mg/dL (ref <200)
HDL: 94 mg/dL (ref >=50)
LDL Cholesterol (Calc): 83 mg/dL (calc)
Non-HDL Cholesterol (Calc): 96 mg/dL (calc) (ref <130)
Total CHOL/HDL Ratio: 2 (calc) (ref <5.0)
Triglycerides: 54 mg/dL (ref <150)

## 2019-12-15 LAB — TSH: TSH: 1.7 mIU/L (ref 0.40–4.50)

## 2019-12-30 MED FILL — CHLORHEXIDINE 0.12% RINSE: 0.12 | 16 days supply | Qty: 473 | Fill #0

## 2019-12-30 MED FILL — IBUPROFEN 400 MG TABS: 400 | 5 days supply | Qty: 30 | Fill #0

## 2019-12-30 MED FILL — HYDROCODON-APAP 5-325: 5-325 | 1 days supply | Qty: 5 | Fill #0

## 2019-12-30 MED FILL — DEXAMETHASONE 4 MG TABLET: 4 | 3 days supply | Qty: 9 | Fill #0

## 2020-01-06 ENCOUNTER — Ambulatory Visit: Payer: 59 | Admitting: Podiatry

## 2020-01-07 ENCOUNTER — Ambulatory Visit (INDEPENDENT_AMBULATORY_CARE_PROVIDER_SITE_OTHER): Payer: 59

## 2020-01-07 ENCOUNTER — Encounter: Payer: Self-pay | Admitting: Podiatry

## 2020-01-07 ENCOUNTER — Other Ambulatory Visit: Payer: Self-pay

## 2020-01-07 ENCOUNTER — Ambulatory Visit: Payer: 59 | Admitting: Podiatry

## 2020-01-07 VITALS — Temp 97.9°F

## 2020-01-07 DIAGNOSIS — M79672 Pain in left foot: Secondary | ICD-10-CM

## 2020-01-07 DIAGNOSIS — M722 Plantar fascial fibromatosis: Secondary | ICD-10-CM

## 2020-01-07 MED ORDER — DICLOFENAC SODIUM 75 MG PO TBEC: 75.0000 mg | DELAYED_RELEASE_TABLET | Freq: Two times a day (BID) | ORAL | 2 refills | Status: DC

## 2020-01-07 MED FILL — DICLOFENAC SODIUM 75 MG TAB: 75 | 25 days supply | Qty: 50 | Fill #0

## 2020-01-07 NOTE — Patient Instructions (Signed)

## 2020-01-08 ENCOUNTER — Other Ambulatory Visit: Payer: Self-pay | Admitting: Podiatry

## 2020-01-08 DIAGNOSIS — M722 Plantar fascial fibromatosis: Secondary | ICD-10-CM

## 2020-01-09 MED FILL — ESOMEPRAZOLE MAG DR 40 MG C: 40 | 90 days supply | Qty: 90 | Fill #3

## 2020-01-11 NOTE — Progress Notes (Signed)
Subjective:   Patient ID: Katherine Vaughan, female   DOB: 59 y.o.   MRN: [id number]   HPI Patient presents stating her heels have really been bothering her more on her left than her right.  States she has had some history of this and is worsened over the last couple weeks and is worse when she gets up in the morning and after periods of sitting.  Patient does not smoke likes to be active   Review of Systems  All other systems reviewed and are negative.       Objective:  Physical Exam Vitals and nursing note reviewed.  Constitutional:      Appearance: She is well-developed.  Pulmonary:     Effort: Pulmonary effort is normal.  Musculoskeletal:        General: Normal range of motion.  Skin:    General: Skin is warm.  Neurological:     Mental Status: She is alert.     Neurovascular status intact muscle strength was found to be adequate range of motion within normal limits.  Patient is noted to have exquisite discomfort plantar aspect left heel at the insertional point of the tendon into the calcaneus with inflammation fluid around the medial band.  Patient is noted to have good digital perfusion is well oriented x3 with moderate depression of the arch noted.     Assessment:  Acute plantar fasciitis left over right with inflammation fluid of the medial band     Plan:  H&P condition reviewed at great length.  I educated her on the condition and today I did sterile prep and injected the fascia at insertion 3 mg Kenalog 5 mg Xylocaine and applied fascial brace to lift up the arch.  Gave instructions for stretching and shoe gear modifications and reappoint to recheck 2 weeks  X-rays indicate no indications currently of stress fracture or indications of stress fracture.  Small spur noted on x-ray

## 2020-01-14 MED FILL — ESTRADIOL 0.05 MG/24HR PTTW: 0.05 | 84 days supply | Qty: 24 | Fill #4

## 2020-01-21 ENCOUNTER — Other Ambulatory Visit: Payer: Self-pay

## 2020-01-21 ENCOUNTER — Ambulatory Visit: Payer: 59 | Admitting: Podiatry

## 2020-01-21 ENCOUNTER — Encounter: Payer: Self-pay | Admitting: Podiatry

## 2020-01-21 DIAGNOSIS — M722 Plantar fascial fibromatosis: Secondary | ICD-10-CM

## 2020-02-10 ENCOUNTER — Encounter: Payer: Self-pay | Admitting: Internal Medicine

## 2020-02-11 NOTE — Progress Notes (Signed)
Subjective:   Patient ID: Katherine Vaughan, female   DOB: 59 y.o.   MRN: [id number]   HPI Patient states it seems to be feeling quite a bit better with mild discomfort only after I been to walk   ROS      Objective:  Physical Exam  Neurovascular status intact with patient's left heel improved with pain upon deep palpation but overall much better than previous     Assessment:  Improved fasciitis left     Plan:  Reviewed condition recommended the continuation of anti-inflammatories physical therapy support shoe gear and patient will be seen back for [place] to recheck as needed with all education given to patient today

## 2020-02-25 MED FILL — DICLOFENAC SODIUM 75 MG TAB: 75 | 25 days supply | Qty: 50 | Fill #1

## 2020-03-16 ENCOUNTER — Other Ambulatory Visit: Payer: Self-pay | Admitting: Internal Medicine

## 2020-03-16 ENCOUNTER — Ambulatory Visit (AMBULATORY_SURGERY_CENTER): Payer: Self-pay

## 2020-03-16 ENCOUNTER — Encounter: Payer: Self-pay | Admitting: Internal Medicine

## 2020-03-16 VITALS — Ht 63.5 in | Wt 115.8 lb

## 2020-03-16 DIAGNOSIS — Z86010 Personal history of colonic polyps: Secondary | ICD-10-CM

## 2020-03-16 MED ORDER — NA SULFATE-K SULFATE-MG SULF 17.5-3.13-1.6 GM/177ML PO SOLN
1.0000 | Freq: Once | ORAL | 0 refills | Status: DC
Start: 2020-03-16 — End: 2020-03-16

## 2020-03-16 MED FILL — SUPREP BOWEL PREP KIT: 17.5-3.13-1 | 1 days supply | Qty: 354 | Fill #0

## 2020-03-16 NOTE — Progress Notes (Signed)
No egg or soy allergy known to patient  No issues with past sedation with any surgeries or procedures No intubation problems in the past  No FH of Malignant Hyperthermia No diet pills per patient No home 02 use per patient  No blood thinners per patient  Pt denies issues with constipation  No A fib or A flutter  EMMI video via [place] 19 guidelines implemented in PV today with Pt and RN  COVID vaccines completed on 06/2019 per pt;  Due to the COVID-19 pandemic we are asking patients to follow these guidelines. Please only bring one care partner. Please be aware that your care partner may wait in the car in the parking lot or if they feel like they will be too hot to wait in the car, they may wait in the lobby on the 4th floor. All care partners are required to wear a mask the entire time (we do not have any that we can provide them), they need to practice social distancing, and we will do a Covid check for all patient's and care partners when you arrive. Also we will check their temperature and your temperature. If the care partner waits in their car they need to stay in the parking lot the entire time and we will call them on their cell phone when the patient is ready for discharge so they can bring the car to the front of the building. Also all patient's will need to wear a mask into building.

## 2020-03-23 ENCOUNTER — Ambulatory Visit: Payer: 59 | Admitting: Podiatry

## 2020-03-23 ENCOUNTER — Other Ambulatory Visit: Payer: Self-pay

## 2020-03-23 DIAGNOSIS — Q666 Other congenital valgus deformities of feet: Secondary | ICD-10-CM

## 2020-03-23 DIAGNOSIS — Z01419 Encounter for gynecological examination (general) (routine) without abnormal findings: Secondary | ICD-10-CM | POA: Diagnosis not present

## 2020-03-23 DIAGNOSIS — Z6820 Body mass index (BMI) 20.0-20.9, adult: Secondary | ICD-10-CM | POA: Diagnosis not present

## 2020-03-23 DIAGNOSIS — M722 Plantar fascial fibromatosis: Secondary | ICD-10-CM

## 2020-03-23 DIAGNOSIS — G43909 Migraine, unspecified, not intractable, without status migrainosus: Secondary | ICD-10-CM | POA: Insufficient documentation

## 2020-03-23 DIAGNOSIS — K449 Diaphragmatic hernia without obstruction or gangrene: Secondary | ICD-10-CM | POA: Insufficient documentation

## 2020-03-23 DIAGNOSIS — Z79890 Hormone replacement therapy (postmenopausal): Secondary | ICD-10-CM | POA: Diagnosis not present

## 2020-03-23 DIAGNOSIS — M1990 Unspecified osteoarthritis, unspecified site: Secondary | ICD-10-CM | POA: Insufficient documentation

## 2020-03-23 DIAGNOSIS — M810 Age-related osteoporosis without current pathological fracture: Secondary | ICD-10-CM | POA: Insufficient documentation

## 2020-03-24 ENCOUNTER — Other Ambulatory Visit (HOSPITAL_COMMUNITY): Payer: Self-pay | Admitting: Radiology

## 2020-03-24 ENCOUNTER — Ambulatory Visit: Payer: 59 | Admitting: Podiatry

## 2020-03-24 ENCOUNTER — Encounter: Payer: Self-pay | Admitting: Podiatry

## 2020-03-24 MED FILL — ESTRADIOL 0.05 MG/24HR PTTW: 0.05 | 84 days supply | Qty: 24 | Fill #0

## 2020-03-24 NOTE — Progress Notes (Signed)
Subjective:  Patient ID: Katherine Vaughan, female    DOB: Feb 12, 1961,  MRN: [id number]  Chief Complaint  Patient presents with  . Foot Pain    Left foot pain on the medial side of foot and heel Pt stated it feels like i am walking on rocks the last two weeks have been horrible with the pain     59 y.o. female presents with the above complaint.  Patient presents with a complaint of left heel pain that started about last 2 weeks ago.  Patient states she was treated in the past by Dr. Paulla Dolly which the injection helped a lot however the pain came back again in came back with a vengeance.  She states that she was trying to control the pain from plantar fascia as well wearing her brace however it has continued to get worse.  She states it feels like walking on rocks.  She denies any other acute complaints.  She would like to discuss long-term management for this.  She denies any other treatment options.   Review of Systems: Negative except as noted in the HPI. Denies N/V/F/Ch.  Past Medical History:  Diagnosis Date  . Arthritis   . GERD (gastroesophageal reflux disease)    on meds  . History of colon polyps 2006   adenomatous, no polyps in 2011  . History of hiatal hernia   . History of kidney stones   . Low blood pressure    occ.  . Migraines    decreased since menopause  . Osteopenia    dexa 08/2010: -2.3 L fem  . Osteoporosis    hx of -  . PONV (postoperative nausea and vomiting)    only with Demerol  . Skin cancer    Middle of chest  . Urine incontinence     Current Outpatient Medications:  .  acetaminophen (TYLENOL) 500 MG tablet, Take 500 mg by mouth every 6 (six) hours as needed., Disp: , Rfl:  .  ampicillin (PRINCIPEN) 500 MG capsule, Take 500 mg by mouth every other day.  , Disp: , Rfl:  .  Ascorbic Acid (VITAMIN C) 1000 MG tablet, Take 1,000 mg by mouth daily., Disp: , Rfl:  .  Ca Cit Malate-Cholecalciferol (CALCIUM CITRATE MALATE-VIT D) 250-100 MG-UNIT TABS, , Disp: ,  Rfl:  .  Cholecalciferol (VITAMIN D3) 2000 UNITS TABS, Take by mouth daily.  , Disp: , Rfl:  .  clobetasol ointment (TEMOVATE) 4.09 %, Apply 1 application topically 2 (two) times daily. (Patient not taking: Reported on 03/16/2020), Disp: , Rfl:  .  diclofenac (VOLTAREN) 75 MG EC tablet, Take 1 tablet (75 mg total) by mouth 2 (two) times daily. (Patient not taking: Reported on 03/16/2020), Disp: 50 tablet, Rfl: 2 .  esomeprazole (NEXIUM) 40 MG capsule, Take 1 capsule (40 mg total) by mouth daily., Disp: 90 capsule, Rfl: 3 .  estradiol (VIVELLE-DOT) 0.05 MG/24HR patch, APPLY 1 PATCH TWICE WEEKLY AS DIRECTED, Disp: , Rfl: 3 .  FIBER SELECT GUMMIES PO, Take 2 tablets by mouth at bedtime., Disp: , Rfl:  .  ibuprofen (ADVIL) 400 MG tablet, Take 400 mg by mouth every 6 (six) hours as needed., Disp: , Rfl:  .  Multiple Vitamin (MULTIVITAMIN) tablet, Take 1 tablet by mouth daily.  , Disp: , Rfl:  .  Omega-3 Fatty Acids (FISH OIL ULTRA) 1400 MG CAPS, Take 1 tablet by mouth daily., Disp: , Rfl:  .  tretinoin (RETIN-A) 0.05 % cream, Apply topically at bedtime. , Disp: ,  Rfl:  .  Turmeric (QC TUMERIC COMPLEX PO), Take by mouth., Disp: , Rfl:   Social History   Tobacco Use  Smoking Status Never Smoker  Smokeless Tobacco Never Used    Allergies  Allergen Reactions  . Demerol   . Percocet [Oxycodone-Acetaminophen]   . Sulfa Antibiotics    Objective:  There were no vitals filed for this visit. There is no height or weight on file to calculate BMI. Constitutional Well developed. Well nourished.  Vascular Dorsalis pedis pulses palpable bilaterally. Posterior tibial pulses palpable bilaterally. Capillary refill normal to all digits.  No cyanosis or clubbing noted. Pedal hair growth normal.  Neurologic Normal speech. Oriented to person, place, and time. Epicritic sensation to light touch grossly present bilaterally.  Dermatologic Nails well groomed and normal in appearance. No open wounds. No  skin lesions.  Orthopedic: Normal joint ROM without pain or crepitus bilaterally. No visible deformities. Tender to palpation at the calcaneal tuber left. No pain with calcaneal squeeze left. Ankle ROM within normal limits to the left side Silfverskiold Test: negative left.   Radiographs: Taken and reviewed. No acute fractures or dislocations. No evidence of stress fracture.  Plantar heel spur absent. Posterior heel spur absent.   Assessment:   1. Pes planovalgus   2. Plantar fasciitis of left foot    Plan:  Patient was evaluated and treated and all questions answered.  Plantar Fasciitis, left - XR reviewed as above.  - Educated on icing and stretching. Instructions given.  - Injection delivered to the plantar fascia as below. - DME: Patient will use the plantar fascial brace as she already has. - Pharmacologic management: None  Semiflexible pes planovalgus -I explained to the patient the etiology of pes planovalgus and various treatment options were discussed.  I believe patient will benefit from custom-made orthotics to help control ankle motion and support the arch of the foot and take the pressure away from the plantar fascia.  Patient states understanding would like to obtain custom-made orthotics -Should be scheduled to see rick for custom-made orthotics  Procedure: Injection Tendon/Ligament Location: Left plantar fascia at the glabrous junction; medial approach. Skin Prep: alcohol Injectate: 0.5 cc 0.5% marcaine plain, 0.5 cc of 1% Lidocaine, 0.5 cc kenalog 10. Disposition: Patient tolerated procedure well. Injection site dressed with a band-aid.  No follow-ups on file.

## 2020-04-08 ENCOUNTER — Ambulatory Visit (AMBULATORY_SURGERY_CENTER): Payer: 59 | Admitting: Internal Medicine

## 2020-04-08 ENCOUNTER — Other Ambulatory Visit: Payer: Self-pay | Admitting: Internal Medicine

## 2020-04-08 ENCOUNTER — Other Ambulatory Visit: Payer: Self-pay

## 2020-04-08 ENCOUNTER — Encounter: Payer: Self-pay | Admitting: Internal Medicine

## 2020-04-08 VITALS — BP 103/56 | HR 57 | Temp 97.8°F | Resp 21 | Ht 63.5 in | Wt 115.0 lb

## 2020-04-08 DIAGNOSIS — Z8371 Family history of colonic polyps: Secondary | ICD-10-CM

## 2020-04-08 DIAGNOSIS — D128 Benign neoplasm of rectum: Secondary | ICD-10-CM

## 2020-04-08 DIAGNOSIS — K621 Rectal polyp: Secondary | ICD-10-CM | POA: Diagnosis not present

## 2020-04-08 DIAGNOSIS — D129 Benign neoplasm of anus and anal canal: Secondary | ICD-10-CM

## 2020-04-08 DIAGNOSIS — Z8489 Family history of other specified conditions: Secondary | ICD-10-CM

## 2020-04-08 DIAGNOSIS — Z1211 Encounter for screening for malignant neoplasm of colon: Secondary | ICD-10-CM | POA: Diagnosis not present

## 2020-04-08 DIAGNOSIS — Z86010 Personal history of colonic polyps: Secondary | ICD-10-CM | POA: Diagnosis not present

## 2020-04-08 MED ORDER — SODIUM CHLORIDE 0.9 % IV SOLN: 500.0000 mL | INTRAVENOUS | Status: DC

## 2020-04-08 MED FILL — ESOMEPRAZOLE MAG DR 40 MG C: 40 | 90 days supply | Qty: 90 | Fill #0

## 2020-04-08 NOTE — Op Note (Signed)
[place] Patient Name: Katherine Vaughan Procedure Date: 04/08/2020 2:15 PM MRN: [id number] Endoscopist: Docia Chuck. Henrene Pastor MD, MD Age: 59 Referring MD:  Date of Birth: November 16, 1960 Gender: Female Account #: [id number] Procedure:                Colonoscopy with cold snare polypectomy x 1 Indications:              High risk colon cancer surveillance: Personal                            history of non-advanced adenoma. Examination 2006                            with tubular adenoma. Last examination 2016 (Dr.                            Watt Climes) normal. Family history of multiple relatives                            with adenomatous polyps under the age of 25 Medicines:                Monitored Anesthesia Care Procedure:                Pre-Anesthesia Assessment:                           - Prior to the procedure, a History and Physical                            was performed, and patient medications and                            allergies were reviewed. The patient's tolerance of                            previous anesthesia was also reviewed. The risks                            and benefits of the procedure and the sedation                            options and risks were discussed with the patient.                            All questions were answered, and informed consent                            was obtained. Prior Anticoagulants: The patient has                            taken no previous anticoagulant or antiplatelet                            agents. ASA Grade Assessment: II - A patient with  mild systemic disease. After reviewing the risks                            and benefits, the patient was deemed in                            satisfactory condition to undergo the procedure.                           After obtaining informed consent, the colonoscope                            was passed under direct vision. Throughout the                             procedure, the patient's blood pressure, pulse, and                            oxygen saturations were monitored continuously. The                            Colonoscope was introduced through the anus and                            advanced to the the cecum, identified by                            appendiceal orifice and ileocecal valve. The                            ileocecal valve, appendiceal orifice, and rectum                            were photographed. The quality of the bowel                            preparation was excellent. The colonoscopy was                            performed without difficulty. The patient tolerated                            the procedure well. The bowel preparation used was                            SUPREP via split dose instruction. Scope In: 2:32:10 PM Scope Out: 2:45:33 PM Scope Withdrawal Time: 0 hours 9 minutes 20 seconds  Total Procedure Duration: 0 hours 13 minutes 23 seconds  Findings:                 A 1 mm polyp was found in the rectum. The polyp was                            removed with a cold snare. Resection and  retrieval                            were complete.                           The exam was otherwise without abnormality on                            direct and retroflexion views. Complications:            No immediate complications. Estimated blood loss:                            None. Estimated Blood Loss:     Estimated blood loss: none. Impression:               - One 1 mm polyp in the rectum, removed with a cold                            snare. Resected and retrieved.                           - The examination was otherwise normal on direct                            and retroflexion views. Recommendation:           - Repeat colonoscopy in 5 years for surveillance.                           - Patient has a contact number available for                            emergencies. The signs and symptoms of  potential                            delayed complications were discussed with the                            patient. Return to normal activities tomorrow.                            Written discharge instructions were provided to the                            patient.                           - Resume previous diet.                           - Continue present medications.                           - Await pathology results. Docia Chuck. Henrene Pastor MD, MD 04/08/2020 2:57:57 PM This report has been signed electronically.

## 2020-04-08 NOTE — Progress Notes (Signed)
Called to room to assist during endoscopic procedure.  Patient ID and intended procedure confirmed with present staff. Received instructions for my participation in the procedure from the performing physician.  

## 2020-04-08 NOTE — Progress Notes (Signed)
To pacu, VSS. Report to [email or web address] 

## 2020-04-08 NOTE — Progress Notes (Signed)
Pt's states no medical or surgical changes since previsit or office visit. 

## 2020-04-08 NOTE — Patient Instructions (Signed)
Discharge instructions given. Handout on polyps. Resume previous medications. YOU HAD AN ENDOSCOPIC PROCEDURE TODAY AT THE [facility]:   Refer to the procedure report that was given to you for any specific questions about what was found during the examination.  If the procedure report does not answer your questions, please call your gastroenterologist to clarify.  If you requested that your care partner not be given the details of your procedure findings, then the procedure report has been included in a sealed envelope for you to review at your convenience later.  YOU SHOULD EXPECT: Some feelings of bloating in the abdomen. Passage of more gas than usual.  Walking can help get rid of the air that was put into your GI tract during the procedure and reduce the bloating. If you had a lower endoscopy (such as a colonoscopy or flexible sigmoidoscopy) you may notice spotting of blood in your stool or on the toilet paper. If you underwent a bowel prep for your procedure, you may not have a normal bowel movement for a few days.  Please Note:  You might notice some irritation and congestion in your nose or some drainage.  This is from the oxygen used during your procedure.  There is no need for concern and it should clear up in a day or so.  SYMPTOMS TO REPORT IMMEDIATELY:  Following lower endoscopy (colonoscopy or flexible sigmoidoscopy):  Excessive amounts of blood in the stool  Significant tenderness or worsening of abdominal pains  Swelling of the abdomen that is new, acute  Fever of 100F or higher   For urgent or emergent issues, a gastroenterologist can be reached at any hour by calling [phone]. Do not use MyChart messaging for urgent concerns.    DIET:  We do recommend a small meal at first, but then you may proceed to your regular diet.  Drink plenty of fluids but you should avoid alcoholic beverages for 24 hours.  ACTIVITY:  You should plan to take it easy for the rest  of today and you should NOT DRIVE or use heavy machinery until tomorrow (because of the sedation medicines used during the test).    FOLLOW UP: Our staff will call the number listed on your records 48-72 hours following your procedure to check on you and address any questions or concerns that you may have regarding the information given to you following your procedure. If we do not reach you, we will leave a message.  We will attempt to reach you two times.  During this call, we will ask if you have developed any symptoms of COVID 19. If you develop any symptoms (ie: fever, flu-like symptoms, shortness of breath, cough etc.) before then, please call [phone].  If you test positive for Covid 19 in the 2 weeks post procedure, please call and report this information to us.    If any biopsies were taken you will be contacted by phone or by letter within the next 1-3 weeks.  Please call us at [phone] if you have not heard about the biopsies in 3 weeks.    SIGNATURES/CONFIDENTIALITY: You and/or your care partner have signed paperwork which will be entered into your electronic medical record.  These signatures attest to the fact that that the information above on your After Visit Summary has been reviewed and is understood.  Full responsibility of the confidentiality of this discharge information lies with you and/or your care-partner.  

## 2020-04-11 ENCOUNTER — Other Ambulatory Visit: Payer: Self-pay

## 2020-04-11 ENCOUNTER — Ambulatory Visit (INDEPENDENT_AMBULATORY_CARE_PROVIDER_SITE_OTHER): Payer: 59 | Admitting: Orthotics

## 2020-04-11 DIAGNOSIS — M722 Plantar fascial fibromatosis: Secondary | ICD-10-CM

## 2020-04-11 DIAGNOSIS — Q666 Other congenital valgus deformities of feet: Secondary | ICD-10-CM | POA: Diagnosis not present

## 2020-04-11 NOTE — Progress Notes (Signed)
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

## 2020-04-12 ENCOUNTER — Telehealth: Payer: Self-pay

## 2020-04-12 NOTE — Telephone Encounter (Signed)
  Follow up Call-  Call back number 04/08/2020  Post procedure Call Back phone  # [phone]  Permission to leave phone message Yes  Some recent data might be hidden     Patient questions:  Do you have a fever, pain , or abdominal swelling? No. Pain Score  0 *  Have you tolerated food without any problems? Yes.    Have you been able to return to your normal activities? Yes.    Do you have any questions about your discharge instructions: Diet   No. Medications  No. Follow up visit  No.  Do you have questions or concerns about your Care? No.  Actions: * If pain score is 4 or above: No action needed, pain <4.   1. Have you developed a fever since your procedure? No   2.   Have you had an respiratory symptoms (SOB or cough) since your procedure? No   3.   Have you tested positive for COVID 19 since your procedure? No   4.   Have you had any family members/close contacts diagnosed with the COVID 19 since your procedure?  No   If yes to any of these questions please route to Joylene John, RN and Joella Prince, RN

## 2020-04-14 ENCOUNTER — Encounter: Payer: Self-pay | Admitting: Internal Medicine

## 2020-05-09 ENCOUNTER — Other Ambulatory Visit: Payer: Self-pay

## 2020-05-09 ENCOUNTER — Ambulatory Visit: Payer: 59 | Admitting: Orthotics

## 2020-05-09 DIAGNOSIS — Q666 Other congenital valgus deformities of feet: Secondary | ICD-10-CM

## 2020-05-09 DIAGNOSIS — M722 Plantar fascial fibromatosis: Secondary | ICD-10-CM

## 2020-05-09 NOTE — Progress Notes (Signed)
Patient came in today to pick up custom made foot orthotics.  The goals were accomplished and the patient reported no dissatisfaction with said orthotics.  Patient was advised of breakin period and how to report any issues. 

## 2020-05-20 ENCOUNTER — Other Ambulatory Visit (HOSPITAL_COMMUNITY): Payer: Self-pay | Admitting: Oral and Maxillofacial Surgery

## 2020-05-20 MED FILL — HYDROCODON-APAP 5-325: 5-325 | 4 days supply | Qty: 5 | Fill #0

## 2020-05-20 MED FILL — IBUPROFEN 400 MG TABS: 400 | 5 days supply | Qty: 30 | Fill #0

## 2020-05-20 MED FILL — DEXAMETHASONE 4 MG TABLET: 4 | 3 days supply | Qty: 9 | Fill #0

## 2020-05-20 MED FILL — CHLORHEXIDINE 0.12% RINSE: 0.12 | 16 days supply | Qty: 473 | Fill #0

## 2020-06-01 ENCOUNTER — Other Ambulatory Visit: Payer: Self-pay

## 2020-06-01 ENCOUNTER — Ambulatory Visit: Payer: 59 | Admitting: Podiatry

## 2020-06-01 DIAGNOSIS — M722 Plantar fascial fibromatosis: Secondary | ICD-10-CM

## 2020-06-01 NOTE — Progress Notes (Signed)
Subjective:   Patient ID: Katherine Vaughan, female   DOB: 59 y.o.   MRN: [id number]   HPI Patient states her heel is absolutely killing her and has not responded conservatively and she needs something done due to the intensity of her discomfort   ROS      Objective:  Physical Exam  Neurovascular status intact with patient found to have exquisite discomfort left plantar heel at the insertional point of the tendon into the calcaneus with inflammation fluid at the insertional point     Assessment:  Acute plantar fasciitis left with pain     Plan:  H&P reviewed condition recommended at this point due to the intensity of discomfort and failure to respond conservatively surgical intervention.  I discussed endoscopic release of the fascia I explained procedure to patient and at this point after extensive review patient signed consent form after we reviewed.  Patient is scheduled for outpatient surgery and understands total recovery can take 6 months and there is no long-term guarantees and the possibilities exist for long-term arch pain or other pains that should resolve over time but may take an extended period of time for this to occur.  Dispensed air fracture walker that I want her to start now in order to get used to it

## 2020-06-08 DIAGNOSIS — M79676 Pain in unspecified toe(s): Secondary | ICD-10-CM

## 2020-06-09 ENCOUNTER — Telehealth: Payer: Self-pay

## 2020-06-09 NOTE — Telephone Encounter (Signed)
DOS 06/14/2020  EPF LT - 60045  [facility] EFFECTIVE DATE - 06/04/2020  PLAN DEDUCTIBLE - $300.00 W/ $300.00 REMAINING OUT OF POCKET - $7900.00 W/ $7900.00 REMAINING COPAY $0.0 COINSURANCE - 60%  SPOKE TO DELANA AT [facility], SHE STATED NO PRECERT REQUIRED FOR CPT [phone]. CALL REF # [id number]

## 2020-06-13 ENCOUNTER — Other Ambulatory Visit: Payer: Self-pay | Admitting: Podiatry

## 2020-06-13 MED ORDER — HYDROCODONE-ACETAMINOPHEN 10-325 MG PO TABS
1.0000 | ORAL_TABLET | Freq: Three times a day (TID) | ORAL | 0 refills | Status: DC | PRN
Start: 2020-06-13 — End: 2020-06-13

## 2020-06-13 MED FILL — HYDROCODON-APAP 10-325: 10-325 | 5 days supply | Qty: 15 | Fill #0

## 2020-06-13 NOTE — Addendum Note (Signed)
Addended by: Wallene Huh on: 06/13/2020 02:19 PM   Modules accepted: Orders

## 2020-06-14 DIAGNOSIS — M722 Plantar fascial fibromatosis: Secondary | ICD-10-CM | POA: Diagnosis not present

## 2020-06-20 ENCOUNTER — Encounter: Payer: 59 | Admitting: Podiatry

## 2020-06-22 ENCOUNTER — Ambulatory Visit (INDEPENDENT_AMBULATORY_CARE_PROVIDER_SITE_OTHER): Payer: 59

## 2020-06-22 ENCOUNTER — Ambulatory Visit (INDEPENDENT_AMBULATORY_CARE_PROVIDER_SITE_OTHER): Payer: 59 | Admitting: Podiatry

## 2020-06-22 ENCOUNTER — Encounter: Payer: Self-pay | Admitting: Podiatry

## 2020-06-22 ENCOUNTER — Other Ambulatory Visit: Payer: Self-pay

## 2020-06-22 DIAGNOSIS — M722 Plantar fascial fibromatosis: Secondary | ICD-10-CM | POA: Diagnosis not present

## 2020-06-22 NOTE — Progress Notes (Signed)
Subjective:   Patient ID: Katherine Vaughan, female   DOB: 60 y.o.   MRN: [id number]   HPI Patient states overall doing well minimal discomfort and only took 1 pain pill   ROS      Objective:  Physical Exam  Neurovascular status intact negative [name]' sign noted wound edges well coapted minimal plantar pain noted upon palpation      Assessment:  Doing well post endoscopic surgery left heel     Plan:  Sterile dressing reapplied continue boot slowly beginning surgical shoe and precautionary did x-rays she had some discomfort which was negative for signs of fracture were negative for signs of other pathology and patient will be seen back 2 week suture removal or earlier if needed

## 2020-07-06 ENCOUNTER — Ambulatory Visit (INDEPENDENT_AMBULATORY_CARE_PROVIDER_SITE_OTHER): Payer: 59 | Admitting: Podiatry

## 2020-07-06 ENCOUNTER — Encounter: Payer: Self-pay | Admitting: Podiatry

## 2020-07-06 ENCOUNTER — Other Ambulatory Visit: Payer: Self-pay

## 2020-07-06 DIAGNOSIS — M722 Plantar fascial fibromatosis: Secondary | ICD-10-CM

## 2020-07-06 NOTE — Progress Notes (Signed)
Subjective:   Patient ID: Katherine Vaughan, female   DOB: 60 y.o.   MRN: [id number]   HPI Patient states doing excellent with surgery with minimal discomfort and ready to get stitches removed neuro   ROS      Objective:  Physical Exam  Vascular status intact negative [name]' sign noted with patient's showing good healing of the medial lateral side left heel wound edges well coapted       Assessment:  Doing well post endoscopic release medial fascial band left     Plan:  Stitches removed ankle compression stocking applied instructed on continued boot usage as needed gradual return to soft shoes reappoint to recheck

## 2020-07-11 MED FILL — ESTRADIOL 0.05 MG/24HR PTTW: 0.05 | 84 days supply | Qty: 24 | Fill #1

## 2020-07-15 ENCOUNTER — Other Ambulatory Visit: Payer: Self-pay | Admitting: Obstetrics and Gynecology

## 2020-07-15 DIAGNOSIS — Z1231 Encounter for screening mammogram for malignant neoplasm of breast: Secondary | ICD-10-CM

## 2020-07-20 DIAGNOSIS — H52203 Unspecified astigmatism, bilateral: Secondary | ICD-10-CM | POA: Diagnosis not present

## 2020-07-20 DIAGNOSIS — H524 Presbyopia: Secondary | ICD-10-CM | POA: Diagnosis not present

## 2020-08-22 MED FILL — ESOMEPRAZOLE MAG DR 40 MG C: 40 | 90 days supply | Qty: 90 | Fill #1

## 2020-08-25 ENCOUNTER — Other Ambulatory Visit (HOSPITAL_BASED_OUTPATIENT_CLINIC_OR_DEPARTMENT_OTHER): Payer: Self-pay

## 2020-09-05 ENCOUNTER — Inpatient Hospital Stay: Admission: RE | Admit: 2020-09-05 | Payer: 59 | Source: Ambulatory Visit

## 2020-09-09 ENCOUNTER — Other Ambulatory Visit: Payer: Self-pay

## 2020-09-09 ENCOUNTER — Ambulatory Visit
Admission: RE | Admit: 2020-09-09 | Discharge: 2020-09-09 | Disposition: A | Discharge status: Home / Self Care | Payer: 59 | Source: Ambulatory Visit | Attending: Obstetrics and Gynecology | Admitting: Obstetrics and Gynecology

## 2020-09-09 DIAGNOSIS — Z1231 Encounter for screening mammogram for malignant neoplasm of breast: Secondary | ICD-10-CM | POA: Diagnosis not present

## 2020-09-19 ENCOUNTER — Other Ambulatory Visit: Payer: Self-pay | Admitting: Obstetrics and Gynecology

## 2020-09-19 ENCOUNTER — Other Ambulatory Visit (HOSPITAL_COMMUNITY): Payer: Self-pay | Admitting: Obstetrics and Gynecology

## 2020-09-19 DIAGNOSIS — Z803 Family history of malignant neoplasm of breast: Secondary | ICD-10-CM

## 2020-09-28 ENCOUNTER — Other Ambulatory Visit (HOSPITAL_COMMUNITY): Payer: Self-pay

## 2020-09-28 MED FILL — Estradiol TD Patch Twice Weekly 0.05 MG/24HR: TRANSDERMAL | 84 days supply | Qty: 24 | Fill #0 | Status: AC

## 2020-09-29 ENCOUNTER — Other Ambulatory Visit: Payer: Self-pay

## 2020-09-29 ENCOUNTER — Ambulatory Visit (HOSPITAL_COMMUNITY)
Admission: RE | Admit: 2020-09-29 | Discharge: 2020-09-29 | Disposition: A | Discharge status: Home / Self Care | Payer: 59 | Source: Ambulatory Visit | Attending: Obstetrics and Gynecology | Admitting: Obstetrics and Gynecology

## 2020-09-29 DIAGNOSIS — Z1239 Encounter for other screening for malignant neoplasm of breast: Secondary | ICD-10-CM | POA: Insufficient documentation

## 2020-09-29 DIAGNOSIS — N6012 Diffuse cystic mastopathy of left breast: Secondary | ICD-10-CM | POA: Diagnosis not present

## 2020-09-29 DIAGNOSIS — N6011 Diffuse cystic mastopathy of right breast: Secondary | ICD-10-CM | POA: Diagnosis not present

## 2020-09-29 DIAGNOSIS — Z803 Family history of malignant neoplasm of breast: Secondary | ICD-10-CM | POA: Insufficient documentation

## 2020-09-29 DIAGNOSIS — Z1231 Encounter for screening mammogram for malignant neoplasm of breast: Secondary | ICD-10-CM | POA: Insufficient documentation

## 2020-09-29 MED ORDER — GADOBUTROL 1 MMOL/ML IV SOLN
5.5000 mL | Freq: Once | INTRAVENOUS | Status: AC | PRN
  Administered 2020-09-29: 5.5 mL via INTRAVENOUS

## 2020-10-26 DIAGNOSIS — D2271 Melanocytic nevi of right lower limb, including hip: Secondary | ICD-10-CM | POA: Diagnosis not present

## 2020-10-26 DIAGNOSIS — D1801 Hemangioma of skin and subcutaneous tissue: Secondary | ICD-10-CM | POA: Diagnosis not present

## 2020-10-26 DIAGNOSIS — D2261 Melanocytic nevi of right upper limb, including shoulder: Secondary | ICD-10-CM | POA: Diagnosis not present

## 2020-10-26 DIAGNOSIS — D2272 Melanocytic nevi of left lower limb, including hip: Secondary | ICD-10-CM | POA: Diagnosis not present

## 2020-10-26 DIAGNOSIS — Z85828 Personal history of other malignant neoplasm of skin: Secondary | ICD-10-CM | POA: Diagnosis not present

## 2020-10-26 DIAGNOSIS — D2262 Melanocytic nevi of left upper limb, including shoulder: Secondary | ICD-10-CM | POA: Diagnosis not present

## 2020-10-26 DIAGNOSIS — L821 Other seborrheic keratosis: Secondary | ICD-10-CM | POA: Diagnosis not present

## 2020-10-26 DIAGNOSIS — D225 Melanocytic nevi of trunk: Secondary | ICD-10-CM | POA: Diagnosis not present

## 2020-10-26 DIAGNOSIS — D224 Melanocytic nevi of scalp and neck: Secondary | ICD-10-CM | POA: Diagnosis not present

## 2020-11-06 MED FILL — Esomeprazole Magnesium Cap Delayed Release 40 MG (Base Eq): ORAL | 90 days supply | Qty: 90 | Fill #0 | Status: AC

## 2020-11-07 ENCOUNTER — Other Ambulatory Visit (HOSPITAL_COMMUNITY): Payer: Self-pay

## 2020-11-23 ENCOUNTER — Encounter: Payer: Self-pay | Admitting: Podiatry

## 2020-11-23 ENCOUNTER — Ambulatory Visit: Payer: 59 | Admitting: Podiatry

## 2020-11-23 ENCOUNTER — Other Ambulatory Visit: Payer: Self-pay

## 2020-11-23 ENCOUNTER — Ambulatory Visit (INDEPENDENT_AMBULATORY_CARE_PROVIDER_SITE_OTHER): Payer: 59

## 2020-11-23 DIAGNOSIS — M84375A Stress fracture, left foot, initial encounter for fracture: Secondary | ICD-10-CM

## 2020-11-23 DIAGNOSIS — M79672 Pain in left foot: Secondary | ICD-10-CM | POA: Diagnosis not present

## 2020-11-24 NOTE — Progress Notes (Signed)
Subjective:   Patient ID: Katherine Vaughan, female   DOB: 60 y.o.   MRN: [id number]   HPI Patient presents with extreme pain on top of the left foot that just started a few days ago after being at the beach and wearing flip-flops.  States that it is hard for her to walk comfortably and its been ongoing and seeming to get worse   ROS      Objective:  Physical Exam  Neurovascular status intact negative [name]' sign noted patient has exquisite discomfort dorsum left foot in the mid metatarsal shaft mostly in the third somewhat in the second with edema around this area     Assessment:  Probability for stress fracture of the dorsal left foot     Plan:  H&P x-ray reviewed discussed with immobilization plan we will start boot usage followed by surgical shoe usage and will be rechecked 4 weeks and hopefully can return to shoe gear by that point.  Patient encouraged to call questions concerns and utilize ice at this time  X-rays indicate suspicion for stress fracture of the third metatarsal shaft the left will have a clear indication when we see her at next visit and will have x-rays repeated

## 2020-12-19 ENCOUNTER — Ambulatory Visit: Payer: 59 | Admitting: Internal Medicine

## 2020-12-19 ENCOUNTER — Other Ambulatory Visit (HOSPITAL_COMMUNITY): Payer: Self-pay

## 2020-12-19 MED FILL — Estradiol TD Patch Twice Weekly 0.05 MG/24HR: TRANSDERMAL | 84 days supply | Qty: 24 | Fill #1 | Status: AC

## 2020-12-20 ENCOUNTER — Other Ambulatory Visit (HOSPITAL_COMMUNITY): Payer: Self-pay

## 2020-12-21 ENCOUNTER — Ambulatory Visit (INDEPENDENT_AMBULATORY_CARE_PROVIDER_SITE_OTHER): Payer: 59

## 2020-12-21 ENCOUNTER — Other Ambulatory Visit: Payer: Self-pay

## 2020-12-21 ENCOUNTER — Encounter: Payer: Self-pay | Admitting: Podiatry

## 2020-12-21 ENCOUNTER — Ambulatory Visit: Payer: 59 | Admitting: Podiatry

## 2020-12-21 DIAGNOSIS — M84375A Stress fracture, left foot, initial encounter for fracture: Secondary | ICD-10-CM

## 2020-12-22 NOTE — Progress Notes (Signed)
Subjective:   Patient ID: Katherine Vaughan, female   DOB: 60 y.o.   MRN: [id number]   HPI Patient presents stating I am still having a lot of pain on top of my left foot and I do think I probably have a broken bone   ROS      Objective:  Physical Exam  Neuro vascular status intact with continued discomfort dorsal right foot that is now mostly around the second metatarsal shaft and very tender with palpation     Assessment:  Stress fracture that I do think is gradually healing but is being difficult     Plan:  H&P x-ray reviewed discussed ice therapy continued immobilization oral anti-inflammatory and I do think this will heal uneventfully.  If symptoms is persisting in the next month to 6 weeks I need to see back  X-rays indicate fracture of the second metatarsal shaft left that is gradually healing but does still have healing to get

## 2020-12-24 NOTE — Patient Instructions (Addendum)
Blood work was ordered.     Medications changes include :   none     Please followup in 1 year    Health Maintenance, Female Adopting a healthy lifestyle and getting preventive care are important in promoting health and wellness. Ask your health care provider about: The right schedule for you to have regular tests and exams. Things you can do on your own to prevent diseases and keep yourself healthy. What should I know about diet, weight, and exercise? Eat a healthy diet  Eat a diet that includes plenty of vegetables, fruits, low-fat dairy products, and lean protein. Do not eat a lot of foods that are high in solid fats, added sugars, or sodium.  Maintain a healthy weight Body mass index (BMI) is used to identify weight problems. It estimates body fat based on height and weight. Your health care provider can help determineyour BMI and help you achieve or maintain a healthy weight. Get regular exercise Get regular exercise. This is one of the most important things you can do for your health. Most adults should: Exercise for at least 150 minutes each week. The exercise should increase your heart rate and make you sweat (moderate-intensity exercise). Do strengthening exercises at least twice a week. This is in addition to the moderate-intensity exercise. Spend less time sitting. Even light physical activity can be beneficial. Watch cholesterol and blood lipids Have your blood tested for lipids and cholesterol at 62 years of age, then havethis test every 5 years. Have your cholesterol levels checked more often if: Your lipid or cholesterol levels are high. You are older than 62 years of age. You are at high risk for heart disease. What should I know about cancer screening? Depending on your health history and family history, you may need to have cancer screening at various ages. This may include screening for: Breast cancer. Cervical cancer. Colorectal cancer. Skin cancer. Lung  cancer. What should I know about heart disease, diabetes, and high blood pressure? Blood pressure and heart disease High blood pressure causes heart disease and increases the risk of stroke. This is more likely to develop in people who have high blood pressure readings, are of African descent, or are overweight. Have your blood pressure checked: Every 3-5 years if you are 18-39 years of age. Every year if you are 40 years old or older. Diabetes Have regular diabetes screenings. This checks your fasting blood sugar level. Have the screening done: Once every three years after age 40 if you are at a normal weight and have a low risk for diabetes. More often and at a younger age if you are overweight or have a high risk for diabetes. What should I know about preventing infection? Hepatitis B If you have a higher risk for hepatitis B, you should be screened for this virus. Talk with your health care provider to find out if you are at risk forhepatitis B infection. Hepatitis C Testing is recommended for: Everyone born from 1945 through 1965. Anyone with known risk factors for hepatitis C. Sexually transmitted infections (STIs) Get screened for STIs, including gonorrhea and chlamydia, if: You are sexually active and are younger than 62 years of age. You are older than 62 years of age and your health care provider tells you that you are at risk for this type of infection. Your sexual activity has changed since you were last screened, and you are at increased risk for chlamydia or gonorrhea. Ask your health care provider if you are   at risk. Ask your health care provider about whether you are at high risk for HIV. Your health care provider may recommend a prescription medicine to help prevent HIV infection. If you choose to take medicine to prevent HIV, you should first get tested for HIV. You should then be tested every 3 months for as long as you are taking the medicine. Pregnancy If you are about  to stop having your period (premenopausal) and you may become pregnant, seek counseling before you get pregnant. Take 400 to 800 micrograms (mcg) of folic acid every day if you become pregnant. Ask for birth control (contraception) if you want to prevent pregnancy. Osteoporosis and menopause Osteoporosis is a disease in which the bones lose minerals and strength with aging. This can result in bone fractures. If you are 65 years old or older, or if you are at risk for osteoporosis and fractures, ask your health care provider if you should: Be screened for bone loss. Take a calcium or vitamin D supplement to lower your risk of fractures. Be given hormone replacement therapy (HRT) to treat symptoms of menopause. Follow these instructions at home: Lifestyle Do not use any products that contain nicotine or tobacco, such as cigarettes, e-cigarettes, and chewing tobacco. If you need help quitting, ask your health care provider. Do not use street drugs. Do not share needles. Ask your health care provider for help if you need support or information about quitting drugs. Alcohol use Do not drink alcohol if: Your health care provider tells you not to drink. You are pregnant, may be pregnant, or are planning to become pregnant. If you drink alcohol: Limit how much you use to 0-1 drink a day. Limit intake if you are breastfeeding. Be aware of how much alcohol is in your drink. In the [place] one drink equals one 12 oz bottle of beer (355 mL), one 5 oz glass of wine (148 mL), or one 1 oz glass of hard liquor (44 mL). General instructions Schedule regular health, dental, and eye exams. Stay current with your vaccines. Tell your health care provider if: You often feel depressed. You have ever been abused or do not feel safe at home. Summary Adopting a healthy lifestyle and getting preventive care are important in promoting health and wellness. Follow your health care provider's instructions about  healthy diet, exercising, and getting tested or screened for diseases. Follow your health care provider's instructions on monitoring your cholesterol and blood pressure. This information is not intended to replace advice given to you by your health care provider. Make sure you discuss any questions you have with your healthcare provider. Document Revised: 05/14/2018 Document Reviewed: 05/14/2018 Elsevier Patient Education  2022 [employer]  

## 2020-12-24 NOTE — Progress Notes (Signed)
Subjective:    Patient ID: Katherine Vaughan, female    DOB: March 04, 1961, 60 y.o.   MRN: [id number]   This visit occurred during the SARS-CoV-2 public health emergency.  Safety protocols were in place, including screening questions prior to the visit, additional usage of staff PPE, and extensive cleaning of exam room while observing appropriate contact time as indicated for disinfecting solutions.    HPI She is here for a physical exam.    She still has a of stress with caring for her parents.  Has foot fracture.  Following with podiatry.    Medications and allergies reviewed with patient and updated if appropriate.  Patient Active Problem List   Diagnosis Date Noted   Arthritis 03/23/2020   Hiatal hernia 03/23/2020   Migraine 03/23/2020   Osteoporosis 03/23/2020   Arthralgia 12/29/2018   Thumb pain, left 05/02/2017   Acne 08/01/2015   Family history of malignant neoplasm of breast 01/04/2014   Family history of malignant neoplasm of ovary 01/04/2014   Vitamin D deficiency disease 07/04/2011   Osteopenia    GERD (gastroesophageal reflux disease)     Current Outpatient Medications on File Prior to Visit  Medication Sig Dispense Refill   acetaminophen (TYLENOL) 500 MG tablet Take 500 mg by mouth every 6 (six) hours as needed.     Ascorbic Acid (VITAMIN C) 1000 MG tablet Take 1,000 mg by mouth daily.     Ca Cit Malate-Cholecalciferol (CALCIUM CITRATE MALATE-VIT D) 250-100 MG-UNIT TABS      Calcium Citrate 1040 MG TABS      Cholecalciferol (VITAMIN D3) 2000 UNITS TABS Take by mouth daily.       esomeprazole (NEXIUM) 40 MG capsule TAKE 1 CAPSULE BY MOUTH ONCE A DAY 90 capsule 3   estradiol (VIVELLE-DOT) 0.05 MG/24HR patch APPLY 1 PATCH TO SKIN TWICE WEEK AS DIRECTED 24 patch 4   FIBER SELECT GUMMIES PO Take 2 tablets by mouth at bedtime.     Multiple Vitamin (MULTIVITAMIN) tablet Take 1 tablet by mouth daily.       Omega-3 Fatty Acids (FISH OIL ULTRA) 1400 MG CAPS Take 1  tablet by mouth daily.     tretinoin (RETIN-A) 0.05 % cream Apply topically at bedtime.      No current facility-administered medications on file prior to visit.    Past Medical History:  Diagnosis Date   Arthritis    GERD (gastroesophageal reflux disease)    on meds   History of colon polyps 2006   adenomatous, no polyps in 2011   History of hiatal hernia    History of kidney stones    Low blood pressure    occ.   Migraines    decreased since menopause   Osteopenia    dexa 08/2010: -2.3 L fem   Osteoporosis    hx of -   PONV (postoperative nausea and vomiting)    only with Demerol   Skin cancer    Middle of chest   Urine incontinence     Past Surgical History:  Procedure Laterality Date   BREAST EXCISIONAL BIOPSY Bilateral    BREAST SURGERY     breast biopsy x's 2 (L) 92 and (R) 98   COLONOSCOPY  2016   at [facility] polyps/polyps/small hems   COLONOSCOPY WITH PROPOFOL N/A 02/11/2015   Procedure: COLONOSCOPY WITH PROPOFOL;  Surgeon: Clarene Essex, MD;  Location: [facility] ENDOSCOPY;  Service: Endoscopy;  Laterality: N/A;   ESOPHAGOGASTRODUODENOSCOPY N/A 02/11/2015   Procedure: ESOPHAGOGASTRODUODENOSCOPY (  EGD);  Surgeon: Clarene Essex, MD;  Location: [facility] ENDOSCOPY;  Service: Endoscopy;  Laterality: N/A;   Laser endometriosis     SEPTOPLASTY     SHOULDER ARTHROSCOPY WITH BICEPS TENDON REPAIR Left 10/14/2018   Procedure: SHOULDER ARTHROSCOPY BICEP TENODESIS, SHOULDER DECOMPRESSION  AND SHOULDER MANIPULATION;  Surgeon: Renette Butters, MD;  Location: [facility];  Service: Orthopedics;  Laterality: Left;  REGIONAL BLOCK   TUBAL LIGATION     VAGINAL HYSTERECTOMY  2006    Social History   Socioeconomic History   Marital status: Married    Spouse name: Not on file   Number of children: 0   Years of education: Not on file   Highest education level: Not on file  Occupational History   Occupation: nurse  Tobacco Use   Smoking status: Never   Smokeless tobacco:  Never  Vaping Use   Vaping Use: Never used  Substance and Sexual Activity   Alcohol use: No   Drug use: No   Sexual activity: Not on file  Other Topics Concern   Not on file  Social History Narrative   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[employer] -   Married, lives with spouse - no children   Social Determinants of Health   Financial Resource Strain: Not on file  Food Insecurity: Not on file  Transportation Needs: Not on file  Physical Activity: Not on file  Stress: Not on file  Social Connections: Not on file    Family History  Problem Relation Age of Onset   Arthritis Mother    Diverticulitis Mother    Colon polyps Mother    Arthritis Father    COPD Father    Atrial fibrillation Father    Heart disease Father    Colon polyps Father    Diverticulitis Father    Stroke Other    Breast cancer Sister 37   Kidney cancer Sister 33   Colon polyps Sister    Diverticulitis Sister 53       s/p colectomy   Colon polyps Sister    Diverticulitis Brother 77   Colon polyps Brother    Pancreatic cancer Paternal Uncle 25   Stomach cancer Paternal Uncle 33   Ovarian cancer Other 28       mat great aunt through MGM with ovarian   Diabetes Maternal Grandfather    Heart disease Maternal Grandfather    Hyperlipidemia Maternal Grandfather    Colon cancer Neg Hx    Esophageal cancer Neg Hx    Rectal cancer Neg Hx     Review of Systems  Constitutional:  Negative for chills and fever.  Eyes:  Negative for visual disturbance.  Respiratory:  Negative for cough, shortness of breath and wheezing.   Cardiovascular:  Negative for chest pain, palpitations and leg swelling.  Gastrointestinal:  Negative for abdominal pain, blood in stool, constipation, diarrhea and nausea.  Genitourinary:  Negative for dysuria.  Musculoskeletal:  Positive for arthralgias (left shoulder) and back pain (intermittent).  Skin:  Negative for color change and rash.  Neurological:  Positive for headaches. Negative for  light-headedness.  Psychiatric/Behavioral:  Negative for dysphoric mood. The patient is not nervous/anxious.       Objective:   Vitals:   12/26/20 0747  BP: 102/80  Pulse: 87  Temp: 98.3 F (36.8 C)  SpO2: 98%   Filed Weights   12/26/20 0747  Weight: 114 lb (51.7 kg)   Body mass index is 19.88 kg/m.  BP Readings from  Last 3 Encounters:  12/26/20 102/80  04/08/20 (!) 103/56  12/14/19 102/70    Wt Readings from Last 3 Encounters:  12/26/20 114 lb (51.7 kg)  04/08/20 115 lb (52.2 kg)  03/16/20 115 lb 12.8 oz (52.5 kg)    PHQ9 SCORE ONLY 12/26/2020 12/23/2019 12/08/2018  PHQ-9 Total Score 1 0 0    GAD 7 : Generalized Anxiety Score 12/26/2020  Nervous, Anxious, on Edge 0  Control/stop worrying 1  Worry too much - different things 1  Trouble relaxing 0  Restless 0  Easily annoyed or irritable 0  Afraid - awful might happen 0  Total GAD 7 Score 2  Anxiety Difficulty Not difficult at all       Physical Exam Constitutional: She appears well-developed and well-nourished. No distress.  HENT:  Head: Normocephalic and atraumatic.  Right Ear: External ear normal. Normal ear canal and TM Left Ear: External ear normal.  Normal ear canal and TM Mouth/Throat: Oropharynx is clear and moist.  Eyes: Conjunctivae and EOM are normal.  Neck: Neck supple. No tracheal deviation present. No thyromegaly present.  No carotid bruit  Cardiovascular: Normal rate, regular rhythm and normal heart sounds.   No murmur heard.  No edema. Pulmonary/Chest: Effort normal and breath sounds normal. No respiratory distress. She has no wheezes. She has no rales.  Breast: deferred   Abdominal: Soft. She exhibits no distension. There is no tenderness.  Lymphadenopathy: She has no cervical adenopathy.  Skin: Skin is warm and dry. She is not diaphoretic.  Psychiatric: She has a normal mood and affect. Her behavior is normal.        Assessment & Plan:   Physical exam: Screening blood work   ordered Exercise   walking Weight  normal Substance abuse  none Sees derm yearly  Screened for depression using the PHQ 9 scale.  No evidence of depression.   Screened for anxiety using GAD7 Scale.  No evidence of significant anxiety.    Health Maintenance  Topic Date Due   INFLUENZA VACCINE  01/02/2021   DEXA SCAN  01/05/2022   MAMMOGRAM  09/10/2022   COLONOSCOPY (Pts 45-85yr Insurance coverage will need to be confirmed)  04/08/2025   TETANUS/TDAP  02/26/2027   Hepatitis C Screening  Completed   HIV Screening  Completed   Zoster Vaccines- Shingrix  Completed   Pneumococcal Vaccine [id number] old  Aged Out   HPV VACCINES  Aged Out          See Problem List for Assessment and Plan of chronic medical problems.

## 2020-12-26 ENCOUNTER — Encounter: Payer: Self-pay | Admitting: Internal Medicine

## 2020-12-26 ENCOUNTER — Other Ambulatory Visit: Payer: Self-pay

## 2020-12-26 ENCOUNTER — Ambulatory Visit (INDEPENDENT_AMBULATORY_CARE_PROVIDER_SITE_OTHER): Payer: 59 | Admitting: Internal Medicine

## 2020-12-26 VITALS — BP 102/80 | HR 87 | Temp 98.3°F | Ht 63.5 in | Wt 114.0 lb

## 2020-12-26 DIAGNOSIS — E559 Vitamin D deficiency, unspecified: Secondary | ICD-10-CM

## 2020-12-26 DIAGNOSIS — Z0000 Encounter for general adult medical examination without abnormal findings: Secondary | ICD-10-CM | POA: Diagnosis not present

## 2020-12-26 DIAGNOSIS — Z1331 Encounter for screening for depression: Secondary | ICD-10-CM

## 2020-12-26 DIAGNOSIS — K219 Gastro-esophageal reflux disease without esophagitis: Secondary | ICD-10-CM

## 2020-12-26 DIAGNOSIS — M85859 Other specified disorders of bone density and structure, unspecified thigh: Secondary | ICD-10-CM | POA: Diagnosis not present

## 2020-12-26 LAB — COMPREHENSIVE METABOLIC PANEL
ALT: 22 U/L (ref 0–35)
AST: 22 U/L (ref 0–37)
Albumin: 4 g/dL (ref 3.5–5.2)
Alkaline Phosphatase: 47 U/L (ref 39–117)
BUN: 16 mg/dL (ref 6–23)
CO2: 31 mEq/L (ref 19–32)
Calcium: 9 mg/dL (ref 8.4–10.5)
Chloride: 103 mEq/L (ref 96–112)
Creatinine, Ser: 0.71 mg/dL (ref 0.40–1.20)
GFR: 91.3 mL/min (ref >60.00)
Glucose, Bld: 86 mg/dL (ref 70–99)
Potassium: 3.8 mEq/L (ref 3.5–5.1)
Sodium: 140 mEq/L (ref 135–145)
Total Bilirubin: 0.9 mg/dL (ref 0.2–1.2)
Total Protein: 6.1 g/dL (ref 6.0–8.3)

## 2020-12-26 LAB — CBC WITH DIFFERENTIAL/PLATELET
Basophils Absolute: 0 10*3/uL (ref 0.0–0.1)
Basophils Relative: 0.7 % (ref 0.0–3.0)
Eosinophils Absolute: 0.1 10*3/uL (ref 0.0–0.7)
Eosinophils Relative: 2.9 % (ref 0.0–5.0)
HCT: 39.3 % (ref 36.0–46.0)
Hemoglobin: 13.2 g/dL (ref 12.0–15.0)
Lymphocytes Relative: 29.5 % (ref 12.0–46.0)
Lymphs Abs: 1.4 10*3/uL (ref 0.7–4.0)
MCHC: 33.6 g/dL (ref 30.0–36.0)
MCV: 89.2 fl (ref 78.0–100.0)
Monocytes Absolute: 0.4 10*3/uL (ref 0.1–1.0)
Monocytes Relative: 9.3 % (ref 3.0–12.0)
Neutro Abs: 2.8 10*3/uL (ref 1.4–7.7)
Neutrophils Relative %: 57.6 % (ref 43.0–77.0)
Platelets: 136 10*3/uL — ABNORMAL LOW (ref 150.0–400.0)
RBC: 4.4 Mil/uL (ref 3.87–5.11)
RDW: 13.6 % (ref 11.5–15.5)
WBC: 4.9 10*3/uL (ref 4.0–10.5)

## 2020-12-26 LAB — TSH: TSH: 1.61 u[IU]/mL (ref 0.35–5.50)

## 2020-12-26 LAB — LIPID PANEL
Cholesterol: 172 mg/dL (ref 0–200)
HDL: 83.2 mg/dL (ref >39.00)
LDL Cholesterol: 71 mg/dL (ref 0–99)
NonHDL: 89.28
Total CHOL/HDL Ratio: 2
Triglycerides: 90 mg/dL (ref 0.0–149.0)
VLDL: 18 mg/dL (ref 0.0–40.0)

## 2020-12-26 LAB — VITAMIN D 25 HYDROXY (VIT D DEFICIENCY, FRACTURES): VITD: 62.15 ng/mL (ref 30.00–100.00)

## 2020-12-26 NOTE — Assessment & Plan Note (Signed)
Chronic GERD controlled Continue Nexium 40 mg daily 

## 2020-12-26 NOTE — Addendum Note (Signed)
Addended by: Boris Lown B on: 12/26/2020 08:44 AM   Modules accepted: Orders

## 2020-12-26 NOTE — Assessment & Plan Note (Signed)
Chronic dexa up to date -  Monitored by Dr Radene Knee On calcium and vitamin d Continue regular exercise On estradiol

## 2020-12-26 NOTE — Assessment & Plan Note (Signed)
Chronic Taking vitamin D daily Check vitamin D level  

## 2021-01-29 ENCOUNTER — Encounter (HOSPITAL_BASED_OUTPATIENT_CLINIC_OR_DEPARTMENT_OTHER): Payer: Self-pay | Admitting: Urology

## 2021-01-29 ENCOUNTER — Emergency Department (HOSPITAL_BASED_OUTPATIENT_CLINIC_OR_DEPARTMENT_OTHER)
Admission: EM | Admit: 2021-01-29 | Discharge: 2021-01-30 | Disposition: A | Discharge status: Home / Self Care | Payer: 59 | Attending: Emergency Medicine | Admitting: Emergency Medicine

## 2021-01-29 ENCOUNTER — Other Ambulatory Visit: Payer: Self-pay

## 2021-01-29 ENCOUNTER — Emergency Department (HOSPITAL_BASED_OUTPATIENT_CLINIC_OR_DEPARTMENT_OTHER): Payer: 59 | Admitting: Radiology

## 2021-01-29 DIAGNOSIS — Z85828 Personal history of other malignant neoplasm of skin: Secondary | ICD-10-CM | POA: Diagnosis not present

## 2021-01-29 DIAGNOSIS — Z043 Encounter for examination and observation following other accident: Secondary | ICD-10-CM | POA: Diagnosis not present

## 2021-01-29 DIAGNOSIS — W01198A Fall on same level from slipping, tripping and stumbling with subsequent striking against other object, initial encounter: Secondary | ICD-10-CM | POA: Insufficient documentation

## 2021-01-29 DIAGNOSIS — Y92513 Shop (commercial) as the place of occurrence of the external cause: Secondary | ICD-10-CM | POA: Diagnosis not present

## 2021-01-29 DIAGNOSIS — S8991XA Unspecified injury of right lower leg, initial encounter: Secondary | ICD-10-CM | POA: Diagnosis not present

## 2021-01-29 DIAGNOSIS — S80211A Abrasion, right knee, initial encounter: Secondary | ICD-10-CM | POA: Diagnosis not present

## 2021-01-29 DIAGNOSIS — S80212A Abrasion, left knee, initial encounter: Secondary | ICD-10-CM | POA: Diagnosis not present

## 2021-01-29 DIAGNOSIS — W19XXXA Unspecified fall, initial encounter: Secondary | ICD-10-CM

## 2021-01-29 DIAGNOSIS — S8992XA Unspecified injury of left lower leg, initial encounter: Secondary | ICD-10-CM | POA: Diagnosis present

## 2021-01-29 MED ORDER — BACITRACIN ZINC 500 UNIT/GM EX OINT
TOPICAL_OINTMENT | Freq: Two times a day (BID) | CUTANEOUS | Status: DC
  Administered 2021-01-29: 1 via TOPICAL
  Filled 2021-01-29: qty 28.35
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
## 2021-02-05 MED FILL — Esomeprazole Magnesium Cap Delayed Release 40 MG (Base Eq): ORAL | 90 days supply | Qty: 90 | Fill #1 | Status: AC

## 2021-02-06 ENCOUNTER — Other Ambulatory Visit (HOSPITAL_COMMUNITY): Payer: Self-pay

## 2021-02-07 ENCOUNTER — Other Ambulatory Visit (HOSPITAL_COMMUNITY): Payer: Self-pay

## 2021-02-07 ENCOUNTER — Encounter: Payer: Self-pay | Admitting: Internal Medicine

## 2021-02-07 ENCOUNTER — Ambulatory Visit: Payer: 59 | Admitting: Internal Medicine

## 2021-02-07 ENCOUNTER — Other Ambulatory Visit: Payer: Self-pay

## 2021-02-07 DIAGNOSIS — S80212A Abrasion, left knee, initial encounter: Secondary | ICD-10-CM | POA: Diagnosis not present
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
## 2021-02-07 NOTE — Assessment & Plan Note (Signed)
Acute xrays in ED w/o infection Abrasion clean and w/o evidence of infection She will continue wound care

## 2021-02-07 NOTE — Patient Instructions (Signed)
    Continue your wound care of your knees.

## 2021-03-10 ENCOUNTER — Ambulatory Visit: Payer: 59

## 2021-03-17 ENCOUNTER — Other Ambulatory Visit: Payer: Self-pay

## 2021-03-17 ENCOUNTER — Ambulatory Visit: Payer: 59 | Attending: Internal Medicine

## 2021-03-17 ENCOUNTER — Other Ambulatory Visit (HOSPITAL_COMMUNITY): Payer: Self-pay

## 2021-03-17 ENCOUNTER — Other Ambulatory Visit (HOSPITAL_BASED_OUTPATIENT_CLINIC_OR_DEPARTMENT_OTHER): Payer: Self-pay

## 2021-03-17 DIAGNOSIS — Z23 Encounter for immunization: Secondary | ICD-10-CM

## 2021-03-17 MED ORDER — PFIZER COVID-19 VAC BIVALENT 30 MCG/0.3ML IM SUSP
INTRAMUSCULAR | 0 refills | Status: DC
  Filled 2021-03-17: qty 0.3, 1d supply, fill #0

## 2021-03-17 MED FILL — Estradiol TD Patch Twice Weekly 0.05 MG/24HR: TRANSDERMAL | 84 days supply | Qty: 24 | Fill #2 | Status: AC

## 2021-03-17 NOTE — Progress Notes (Signed)
   Covid-19 Vaccination Clinic  Name:  Nakiah Osgood    MRN: [id number] DOB: Nov 10, 1960  03/17/2021  Ms. Fesperman was observed post Covid-19 immunization for 15 minutes without incident. She was provided with Vaccine Information Sheet and instruction to access the V-Safe system.   Ms. Dexheimer was instructed to call 911 with any severe reactions post vaccine: Difficulty breathing  Swelling of face and throat  A fast heartbeat  A bad rash all over body  Dizziness and weakness

## 2021-03-20 ENCOUNTER — Other Ambulatory Visit (HOSPITAL_COMMUNITY): Payer: Self-pay

## 2021-03-25 ENCOUNTER — Encounter: Payer: Self-pay | Admitting: Internal Medicine

## 2021-03-29 DIAGNOSIS — Z01419 Encounter for gynecological examination (general) (routine) without abnormal findings: Secondary | ICD-10-CM | POA: Diagnosis not present

## 2021-03-29 DIAGNOSIS — Z6820 Body mass index (BMI) 20.0-20.9, adult: Secondary | ICD-10-CM | POA: Diagnosis not present

## 2021-04-03 ENCOUNTER — Other Ambulatory Visit: Payer: Self-pay

## 2021-04-03 ENCOUNTER — Ambulatory Visit (INDEPENDENT_AMBULATORY_CARE_PROVIDER_SITE_OTHER): Payer: 59

## 2021-04-03 ENCOUNTER — Encounter: Payer: Self-pay | Admitting: Podiatry

## 2021-04-03 ENCOUNTER — Ambulatory Visit: Payer: 59 | Admitting: Podiatry

## 2021-04-03 DIAGNOSIS — M722 Plantar fascial fibromatosis: Secondary | ICD-10-CM | POA: Diagnosis not present

## 2021-04-03 DIAGNOSIS — M84375A Stress fracture, left foot, initial encounter for fracture: Secondary | ICD-10-CM

## 2021-04-03 MED ORDER — TRIAMCINOLONE ACETONIDE 10 MG/ML IJ SUSP
10.0000 mg | Freq: Once | INTRAMUSCULAR | Status: AC
  Administered 2021-04-03: 10 mg

## 2021-04-03 NOTE — Progress Notes (Signed)
Subjective:   Patient ID: Katherine Vaughan, female   DOB: 60 y.o.   MRN: [id number]   HPI Patient presents stating she has started to develop discomfort in her left arch and that seems like its been there for a couple months and she does not remember specific injury and had been improved with her heel but this is becoming a problem for her.  She had endoscopic heel surgery done approximate 10 months ago   ROS      Objective:  Physical Exam  Neuro neurovascular status intact with patient found to have inflammation of the plantar fascia distal to its insertion of the calcaneus with the insertion where she used to have her pain doing well with minimal discomfort.  Swelling of the dorsal foot has pretty much resolved     Assessment:  Doing better after having had a stress fracture left but may have made her walk different inflamed up the medial band of the fascia and central band 8     Plan:  H&P reviewed this condition and x-rays and today I did a mid arch injection 3 mg Kenalog 5 g liken dispensed night splint with instructions with sleeping it and also using it during the evening with heat and ice treatment.  Patient will be seen back 3 weeks or earlier if needed  X-rays indicate there is a fracture that is healed well at the shaft of the second metatarsal left

## 2021-04-19 ENCOUNTER — Other Ambulatory Visit (HOSPITAL_COMMUNITY): Payer: Self-pay

## 2021-04-19 DIAGNOSIS — Z8041 Family history of malignant neoplasm of ovary: Secondary | ICD-10-CM | POA: Diagnosis not present

## 2021-04-19 MED ORDER — ESTRADIOL 0.0375 MG/24HR TD PTTW
MEDICATED_PATCH | TRANSDERMAL | 4 refills | Status: DC
  Filled 2021-04-19: qty 8, 28d supply, fill #0
  Filled 2021-04-19: qty 16, 56d supply, fill #0
  Filled 2021-06-30: qty 24, 84d supply, fill #1
  Filled 2021-09-23: qty 24, 84d supply, fill #2
  Filled 2021-12-19: qty 24, 84d supply, fill #3
  Filled 2022-03-10: qty 24, 84d supply, fill #4

## 2021-04-21 ENCOUNTER — Other Ambulatory Visit (HOSPITAL_COMMUNITY): Payer: Self-pay

## 2021-04-23 ENCOUNTER — Ambulatory Visit
Admission: EM | Admit: 2021-04-23 | Discharge: 2021-04-23 | Disposition: A | Discharge status: Home / Self Care | Payer: 59 | Attending: Family Medicine | Admitting: Family Medicine

## 2021-04-23 ENCOUNTER — Other Ambulatory Visit: Payer: Self-pay

## 2021-04-23 DIAGNOSIS — Z20818 Contact with and (suspected) exposure to other bacterial communicable diseases: Secondary | ICD-10-CM

## 2021-04-23 DIAGNOSIS — J069 Acute upper respiratory infection, unspecified: Secondary | ICD-10-CM

## 2021-04-23 DIAGNOSIS — Z20828 Contact with and (suspected) exposure to other viral communicable diseases: Secondary | ICD-10-CM | POA: Diagnosis not present

## 2021-04-23 LAB — POCT RAPID STREP A (OFFICE): Rapid Strep A Screen: NEGATIVE

## 2021-04-23 NOTE — ED Triage Notes (Signed)
Patient states her throat is sore since Thursday and now it is worse. She states she has a cough with yellow mucus. Both ears are hurting with the right ear the worse.  She took tylenol yesterday and used throat lozens. She states she has white patches in the back of her throat. She tested herself for Covid and it was negative.  No Fever.

## 2021-04-23 NOTE — ED Provider Notes (Signed)
[facility]    CSN: [id number] Arrival date & time: 04/23/21  1127      History   Chief Complaint No chief complaint on file.   HPI Katherine Vaughan is a 60 y.o. female.   Patient presenting today with 4 to 5-day history of hoarseness, sore throat, ear pressure and popping, runny nose, mild cough.  Denies fever, chills, body aches, chest pain, shortness of breath, abdominal pain, nausea vomiting or diarrhea.  So far taking throat lozenges, Tylenol with mild temporary relief.  Multiple sick contacts recently with cold symptoms.  No known pertinent chronic medical problems per patient.   Past Medical History:  Diagnosis Date   Arthritis    GERD (gastroesophageal reflux disease)    on meds   History of colon polyps 2006   adenomatous, no polyps in 2011   History of hiatal hernia    History of kidney stones    Low blood pressure    occ.   Migraines    decreased since menopause   Osteopenia    dexa 08/2010: -2.3 L fem   Osteoporosis    hx of -   PONV (postoperative nausea and vomiting)    only with Demerol   Skin cancer    Middle of chest   Urine incontinence     Patient Active Problem List   Diagnosis Date Noted   Knee abrasion, left, initial encounter 02/07/2021   Arthritis 03/23/2020   Hiatal hernia 03/23/2020   Migraine 03/23/2020   Arthralgia 12/29/2018   Thumb pain, left 05/02/2017   Acne 08/01/2015   Family history of malignant neoplasm of breast 01/04/2014   Family history of malignant neoplasm of ovary 01/04/2014   Vitamin D deficiency disease 07/04/2011   Osteopenia    GERD (gastroesophageal reflux disease)     Past Surgical History:  Procedure Laterality Date   BREAST EXCISIONAL BIOPSY Bilateral    BREAST SURGERY     breast biopsy x's 2 (L) 92 and (R) 98   COLONOSCOPY  2016   at [facility] polyps/polyps/small hems   COLONOSCOPY WITH PROPOFOL N/A 02/11/2015   Procedure: COLONOSCOPY WITH PROPOFOL;  Surgeon: Clarene Essex, MD;   Location: [facility] ENDOSCOPY;  Service: Endoscopy;  Laterality: N/A;   ESOPHAGOGASTRODUODENOSCOPY N/A 02/11/2015   Procedure: ESOPHAGOGASTRODUODENOSCOPY (EGD);  Surgeon: Clarene Essex, MD;  Location: [facility] ENDOSCOPY;  Service: Endoscopy;  Laterality: N/A;   Laser endometriosis     SEPTOPLASTY     SHOULDER ARTHROSCOPY WITH BICEPS TENDON REPAIR Left 10/14/2018   Procedure: SHOULDER ARTHROSCOPY BICEP TENODESIS, SHOULDER DECOMPRESSION  AND SHOULDER MANIPULATION;  Surgeon: Renette Butters, MD;  Location: [place];  Service: Orthopedics;  Laterality: Left;  REGIONAL BLOCK   TUBAL LIGATION     VAGINAL HYSTERECTOMY  2006    OB History   No obstetric history on file.      Home Medications    Prior to Admission medications   Medication Sig Start Date End Date Taking? Authorizing Provider  acetaminophen (TYLENOL) 500 MG tablet Take 500 mg by mouth every 6 (six) hours as needed.    [provider]  Ascorbic Acid (VITAMIN C) 1000 MG tablet Take 1,000 mg by mouth daily.    [provider]  Ca Cit Malate-Cholecalciferol (CALCIUM CITRATE MALATE-VIT D) 250-100 MG-UNIT TABS  11/25/17   [provider]  Calcium Carbonate-Vitamin D 600-200 MG-UNIT CAPS Take by mouth.    [provider]  Calcium Citrate 1040 MG TABS  07/03/20  [provider]  Cholecalciferol (VITAMIN D3) 2000 UNITS TABS Take by mouth daily.      [provider]  COVID-19 mRNA bivalent vaccine, Pfizer, (PFIZER COVID-19 VAC BIVALENT) injection Inject into the muscle. 03/17/21   Carlyle Basques, MD  esomeprazole (NEXIUM) 40 MG capsule TAKE 1 CAPSULE BY MOUTH ONCE A DAY 04/08/20 05/08/21  Irene Shipper, MD  estradiol (VIVELLE-DOT) 0.0375 MG/24HR Apply 1 patch twice a week 04/19/21     estradiol (VIVELLE-DOT) 0.05 MG/24HR patch APPLY 1 PATCH TO SKIN TWICE WEEK AS DIRECTED 03/24/20 06/12/21  Chrzanowski, Jami B, NP  FIBER SELECT GUMMIES PO Take 2 tablets by mouth at bedtime.    [provider]  Multiple Vitamin (MULTIVITAMIN) tablet Take 1 tablet by mouth daily.      [provider]  Multiple Vitamins-Minerals (MULTI-VITAMIN GUMMIES) CHEW Chew 1 tablet by mouth daily. 08/26/20   [provider]  Omega-3 Fatty Acids (FISH OIL ULTRA) 1400 MG CAPS Take 1 tablet by mouth daily.    [provider]  tretinoin (RETIN-A) 0.05 % cream Apply topically at bedtime.  03/11/17   [provider]    Family History Family History  Problem Relation Age of Onset   Arthritis Mother    Diverticulitis Mother    Colon polyps Mother    Arthritis Father    COPD Father    Atrial fibrillation Father    Heart disease Father    Colon polyps Father    Diverticulitis Father    Stroke Other    Breast cancer Sister 62   Kidney cancer Sister 5   Colon polyps Sister    Diverticulitis Sister 9       s/p colectomy   Colon polyps Sister    Diverticulitis Brother 53   Colon polyps Brother    Pancreatic cancer Paternal Uncle 64   Stomach cancer Paternal Uncle 43   Ovarian cancer Other 63       mat great aunt through MGM with ovarian   Diabetes Maternal Grandfather    Heart disease Maternal Grandfather    Hyperlipidemia Maternal Grandfather    Colon cancer Neg Hx    Esophageal cancer Neg Hx    Rectal cancer Neg Hx     Social History Social History   Tobacco Use   Smoking status: Never   Smokeless tobacco: Never  Vaping Use   Vaping Use: Never used  Substance Use Topics   Alcohol use: No   Drug use: No     Allergies   Demerol, Percocet [oxycodone-acetaminophen], and Sulfa antibiotics   Review of Systems Review of Systems Per HPI  Physical Exam Triage Vital Signs ED Triage Vitals  Enc Vitals Group     BP 04/23/21 1315 118/77     Pulse Rate 04/23/21 1315 70     Resp 04/23/21 1315 16     Temp 04/23/21 1315 98 F (36.7 C)     Temp Source 04/23/21 1315 Oral     SpO2 04/23/21 1315 98 %     Weight --      Height --      Head  Circumference --      Peak Flow --      Pain Score 04/23/21 1309 4     Pain Loc --      Pain Edu? --      Excl. in [place]? --    No data found.  Updated Vital Signs BP 118/77 (BP Location: Right Arm)  Pulse 70   Temp 98 F (36.7 C) (Oral)   Resp 16   SpO2 98%   Visual Acuity Right Eye Distance:   Left Eye Distance:   Bilateral Distance:    Right Eye Near:   Left Eye Near:    Bilateral Near:     Physical Exam Vitals and nursing note reviewed.  Constitutional:      Appearance: Normal appearance. She is not ill-appearing.  HENT:     Head: Atraumatic.     Right Ear: Tympanic membrane normal.     Left Ear: Tympanic membrane normal.     Nose: Rhinorrhea present.     Mouth/Throat:     Mouth: Mucous membranes are moist.     Pharynx: Posterior oropharyngeal erythema present.  Eyes:     Extraocular Movements: Extraocular movements intact.     Conjunctiva/sclera: Conjunctivae normal.  Cardiovascular:     Rate and Rhythm: Normal rate and regular rhythm.     Heart sounds: Normal heart sounds.  Pulmonary:     Effort: Pulmonary effort is normal.     Breath sounds: Normal breath sounds. No wheezing or rales.  Musculoskeletal:        General: Normal range of motion.     Cervical back: Normal range of motion and neck supple.  Skin:    General: Skin is warm and dry.  Neurological:     Mental Status: She is alert and oriented to person, place, and time.  Psychiatric:        Mood and Affect: Mood normal.        Thought Content: Thought content normal.        Judgment: Judgment normal.     UC Treatments / Results  Labs (all labs ordered are listed, but only abnormal results are displayed) Labs Reviewed  COVID-19, FLU A+B NAA  POCT RAPID STREP A (OFFICE)    EKG   Radiology No results found.  Procedures Procedures (including critical care time)  Medications Ordered in UC Medications - No data to display  Initial Impression / Assessment and Plan / UC Course  I  have reviewed the triage vital signs and the nursing notes.  Pertinent labs & imaging results that were available during my care of the patient were reviewed by me and considered in my medical decision making (see chart for details).     Vitals and exam overall reassuring, consistent with viral upper respiratory infection.  Treat with Sudafed, Flonase, throat lozenges and warm honey tea.  Return for acutely worsening symptoms.  Given flu testing pending.  Rapid strep negative.  Final Clinical Impressions(s) / UC Diagnoses   Final diagnoses:  Viral URI with cough   Discharge Instructions   None    ED Prescriptions   None    PDMP not reviewed this encounter.   Volney American, [place] 04/23/21 1411

## 2021-04-24 ENCOUNTER — Other Ambulatory Visit (HOSPITAL_COMMUNITY): Payer: Self-pay

## 2021-04-24 LAB — COVID-19, FLU A+B NAA
Influenza A, NAA: NOT DETECTED
Influenza B, NAA: NOT DETECTED
SARS-CoV-2, NAA: NOT DETECTED

## 2021-04-26 ENCOUNTER — Ambulatory Visit: Payer: 59 | Admitting: Podiatry

## 2021-04-29 ENCOUNTER — Other Ambulatory Visit (HOSPITAL_COMMUNITY): Payer: Self-pay

## 2021-05-01 ENCOUNTER — Encounter: Payer: Self-pay | Admitting: Podiatry

## 2021-05-01 ENCOUNTER — Ambulatory Visit: Payer: 59 | Admitting: Podiatry

## 2021-05-01 ENCOUNTER — Other Ambulatory Visit: Payer: Self-pay

## 2021-05-01 DIAGNOSIS — M722 Plantar fascial fibromatosis: Secondary | ICD-10-CM | POA: Diagnosis not present

## 2021-05-01 MED ORDER — TRIAMCINOLONE ACETONIDE 10 MG/ML IJ SUSP
10.0000 mg | Freq: Once | INTRAMUSCULAR | Status: AC
  Administered 2021-05-01: 09:00:00 10 mg

## 2021-05-01 NOTE — Progress Notes (Signed)
Subjective:   Patient ID: Katherine Vaughan, female   DOB: 60 y.o.   MRN: [id number]   HPI Patient states her arch is feeling better but the bottom outside of the heel is still sore and she does remember injuring her foot 3 months ago when she fell in a parking lot   ROS      Objective:  Physical Exam  Neurovascular status intact with acute inflammation on the plantar lateral aspect of the left heel with inflammation fluid with the mid arch area improved     Assessment:  Plantar lateral fasciitis which may have been brought on by injury which occurred 3 months ago     Plan:  H&P reviewed condition sterile prep and injected the plantar lateral aspect of the fascia 3 mg Kenalog 5 mg Xylocaine and advised on beginning to wear her walking boot again.  Reappoint for [place] to recheck

## 2021-05-13 ENCOUNTER — Other Ambulatory Visit: Payer: Self-pay | Admitting: Internal Medicine

## 2021-05-15 ENCOUNTER — Other Ambulatory Visit (HOSPITAL_COMMUNITY): Payer: Self-pay

## 2021-05-15 MED ORDER — ESOMEPRAZOLE MAGNESIUM 40 MG PO CPDR
40.0000 mg | DELAYED_RELEASE_CAPSULE | Freq: Every day | ORAL | 0 refills | Status: DC
  Filled 2021-05-15: qty 90, 90d supply, fill #0

## 2021-05-18 ENCOUNTER — Other Ambulatory Visit (HOSPITAL_COMMUNITY): Payer: Self-pay

## 2021-05-22 ENCOUNTER — Encounter: Payer: Self-pay | Admitting: Podiatry

## 2021-05-22 ENCOUNTER — Ambulatory Visit: Payer: 59 | Admitting: Podiatry

## 2021-05-22 ENCOUNTER — Other Ambulatory Visit: Payer: Self-pay

## 2021-05-22 DIAGNOSIS — M722 Plantar fascial fibromatosis: Secondary | ICD-10-CM

## 2021-05-22 MED ORDER — TRIAMCINOLONE ACETONIDE 10 MG/ML IJ SUSP
10.0000 mg | Freq: Once | INTRAMUSCULAR | Status: AC
  Administered 2021-05-22: 09:00:00 10 mg

## 2021-05-22 NOTE — Progress Notes (Signed)
Subjective:   Patient ID: Katherine Vaughan, female   DOB: 60 y.o.   MRN: [id number]   HPI F patient states that she is improving but she has 1 small spot on the outside back of her left heel that remains sore   ROS      Objective:  Physical Exam  Neurovascular status intact improvement but there is 1 small area on the plantar lateral aspect of the left heel which remains inflamed     Assessment:  Improving from fasciitis with 1 small area of inflammation     Plan:  Sterile prep and did a careful injection of the plantar lateral aspect with 2 mg Dexasone Kenalog 5 mg Xylocaine advised on anti-inflammatories continue boot and splint usage and reappoint to recheck

## 2021-06-30 ENCOUNTER — Other Ambulatory Visit (HOSPITAL_COMMUNITY): Payer: Self-pay

## 2021-07-01 ENCOUNTER — Other Ambulatory Visit (HOSPITAL_COMMUNITY): Payer: Self-pay

## 2021-07-26 DIAGNOSIS — H52203 Unspecified astigmatism, bilateral: Secondary | ICD-10-CM | POA: Diagnosis not present

## 2021-08-06 ENCOUNTER — Other Ambulatory Visit: Payer: Self-pay | Admitting: Internal Medicine

## 2021-08-07 ENCOUNTER — Other Ambulatory Visit (HOSPITAL_COMMUNITY): Payer: Self-pay

## 2021-08-09 ENCOUNTER — Other Ambulatory Visit: Payer: Self-pay | Admitting: Obstetrics and Gynecology

## 2021-08-09 DIAGNOSIS — Z1231 Encounter for screening mammogram for malignant neoplasm of breast: Secondary | ICD-10-CM

## 2021-08-14 ENCOUNTER — Other Ambulatory Visit (HOSPITAL_COMMUNITY): Payer: Self-pay

## 2021-08-14 ENCOUNTER — Telehealth: Payer: Self-pay | Admitting: Internal Medicine

## 2021-08-14 MED ORDER — ESOMEPRAZOLE MAGNESIUM 40 MG PO CPDR
40.0000 mg | DELAYED_RELEASE_CAPSULE | Freq: Every day | ORAL | 0 refills | Status: DC
  Filled 2021-08-14: qty 30, 30d supply, fill #0

## 2021-08-14 NOTE — Telephone Encounter (Signed)
Patient has an appointment 4/28 with Dr. Henrene Pastor for med refills.  She is completely out of her Nexium and is asking if she can get a refill enough to last her until her appointment.  Please call and advise.  Thank you. ?

## 2021-08-14 NOTE — Telephone Encounter (Signed)
Prescription sent to patient's pharmacy.

## 2021-09-13 ENCOUNTER — Ambulatory Visit: Payer: 59

## 2021-09-14 ENCOUNTER — Ambulatory Visit: Payer: 59

## 2021-09-17 ENCOUNTER — Other Ambulatory Visit: Payer: Self-pay | Admitting: Internal Medicine

## 2021-09-18 ENCOUNTER — Other Ambulatory Visit: Payer: Self-pay | Admitting: Internal Medicine

## 2021-09-18 ENCOUNTER — Other Ambulatory Visit (HOSPITAL_COMMUNITY): Payer: Self-pay

## 2021-09-19 ENCOUNTER — Ambulatory Visit
Admission: RE | Admit: 2021-09-19 | Discharge: 2021-09-19 | Disposition: A | Discharge status: Home / Self Care | Payer: 59 | Source: Ambulatory Visit | Attending: Obstetrics and Gynecology | Admitting: Obstetrics and Gynecology

## 2021-09-19 DIAGNOSIS — Z1231 Encounter for screening mammogram for malignant neoplasm of breast: Secondary | ICD-10-CM

## 2021-09-21 ENCOUNTER — Other Ambulatory Visit: Payer: Self-pay | Admitting: Obstetrics and Gynecology

## 2021-09-21 DIAGNOSIS — R928 Other abnormal and inconclusive findings on diagnostic imaging of breast: Secondary | ICD-10-CM

## 2021-09-22 ENCOUNTER — Other Ambulatory Visit: Payer: Self-pay | Admitting: Internal Medicine

## 2021-09-22 ENCOUNTER — Telehealth: Payer: Self-pay | Admitting: Internal Medicine

## 2021-09-22 ENCOUNTER — Other Ambulatory Visit (HOSPITAL_COMMUNITY): Payer: Self-pay

## 2021-09-22 NOTE — Telephone Encounter (Signed)
Patient has an appointment with Dr. Henrene Pastor on 4/28 for a med check.  She is out of her Nexium and would like enough to be called into the pharmacy until she comes to see him.  She made this request originally on 3/13 and a 30-day supply was sent to the pharmacy; however, that has run out and she needs more.  If there is an issue, please call patient and advise.  Thank you. ?

## 2021-09-23 ENCOUNTER — Other Ambulatory Visit (HOSPITAL_COMMUNITY): Payer: Self-pay

## 2021-09-25 ENCOUNTER — Other Ambulatory Visit (HOSPITAL_COMMUNITY): Payer: Self-pay

## 2021-09-25 MED ORDER — ESOMEPRAZOLE MAGNESIUM 40 MG PO CPDR
40.0000 mg | DELAYED_RELEASE_CAPSULE | Freq: Every day | ORAL | 2 refills | Status: DC
  Filled 2021-09-25: qty 30, 30d supply, fill #0

## 2021-09-25 NOTE — Telephone Encounter (Signed)
Medication refilled

## 2021-09-26 ENCOUNTER — Other Ambulatory Visit: Payer: Self-pay | Admitting: Obstetrics and Gynecology

## 2021-09-26 DIAGNOSIS — R928 Other abnormal and inconclusive findings on diagnostic imaging of breast: Secondary | ICD-10-CM

## 2021-09-29 ENCOUNTER — Ambulatory Visit: Payer: 59 | Admitting: Internal Medicine

## 2021-09-29 ENCOUNTER — Encounter: Payer: Self-pay | Admitting: Internal Medicine

## 2021-09-29 ENCOUNTER — Other Ambulatory Visit (HOSPITAL_COMMUNITY): Payer: Self-pay

## 2021-09-29 VITALS — BP 100/65 | HR 59 | Ht 63.5 in | Wt 112.5 lb

## 2021-09-29 DIAGNOSIS — Z8489 Family history of other specified conditions: Secondary | ICD-10-CM | POA: Diagnosis not present

## 2021-09-29 DIAGNOSIS — K219 Gastro-esophageal reflux disease without esophagitis: Secondary | ICD-10-CM

## 2021-09-29 DIAGNOSIS — Z86010 Personal history of colonic polyps: Secondary | ICD-10-CM | POA: Diagnosis not present

## 2021-09-29 MED ORDER — ESOMEPRAZOLE MAGNESIUM 40 MG PO CPDR
40.0000 mg | DELAYED_RELEASE_CAPSULE | Freq: Every day | ORAL | 3 refills | Status: DC
  Filled 2021-09-29 – 2021-10-16 (×2): qty 90, 90d supply, fill #0
  Filled 2022-01-17: qty 90, 90d supply, fill #1
  Filled 2022-04-15: qty 90, 90d supply, fill #2

## 2021-09-29 NOTE — Patient Instructions (Signed)
If you are age 63 or older, your body mass index should be between 23-30. Your Body mass index is 19.62 kg/m?Marland Kitchen If this is out of the aforementioned range listed, please consider follow up with your Primary Care Provider. ? ?If you are age 29 or younger, your body mass index should be between 19-25. Your Body mass index is 19.62 kg/m?Marland Kitchen If this is out of the aformentioned range listed, please consider follow up with your Primary Care Provider.  ? ?________________________________________________________ ? ?The [facility] providers would like to encourage you to use [facility] to communicate with providers for non-urgent requests or questions.  Due to long hold times on the telephone, sending your provider a message by [facility] may be a faster and more efficient way to get a response.  Please allow 48 business hours for a response.  Please remember that this is for non-urgent requests.  ?_______________________________________________________ ? ?We have sent the following medications to your pharmacy for you to pick up at your convenience:  Nexium ? ?Please follow up in 2 years ? ?

## 2021-09-29 NOTE — Progress Notes (Signed)
HISTORY OF PRESENT ILLNESS: ? ?Katherine Vaughan is a 61 y.o. 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  She has a history of chronic GERD and a strong family history of adenomatous colon polyps in multiple first-degree relatives less than age 16.  She was last seen in this office March 30, 2019 regarding GERD and a family history of colon polyps.  See that dictation for details.  She was continued on Nexium 40 mg daily.  She presents today for routine follow-up.  Upper endoscopy elsewhere in 2016 revealed a small hiatal hernia, a few small gastric polyps, and otherwise normal exam.  Her last colonoscopy here April 08, 2020 revealed a hyperplastic polyp.  The exam was otherwise normal.  Due to her family history, follow-up in 5 years recommended. ? ?Patient tells me that her reflux symptoms are well controlled with Nexium.  However, off Nexium significant symptoms.  She denies dysphagia.  She denies any lower GI complaints.  She is anticipating retiring later this year to take care of her parents. ? ?REVIEW OF SYSTEMS: ? ?All non-GI ROS negative entirely. ? ?Past Medical History:  ?Diagnosis Date  ? Arthritis   ? GERD (gastroesophageal reflux disease)   ? on meds  ? History of colon polyps 2006  ? adenomatous, no polyps in 2011  ? History of hiatal hernia   ? History of kidney stones   ? Low blood pressure   ? occ.  ? Migraines   ? decreased since menopause  ? Osteopenia   ? dexa 08/2010: -2.3 L fem  ? Osteoporosis   ? hx of -  ? PONV (postoperative nausea and vomiting)   ? only with Demerol  ? Skin cancer   ? Middle of chest  ? Urine incontinence   ? ? ?Past Surgical History:  ?Procedure Laterality Date  ? BREAST EXCISIONAL BIOPSY Bilateral   ? BREAST SURGERY    ? breast biopsy x's 2 (L) 92 and (R) 98  ? COLONOSCOPY  2016  ? at [facility] polyps/polyps/small hems  ? COLONOSCOPY WITH PROPOFOL  N/A 02/11/2015  ? Procedure: COLONOSCOPY WITH PROPOFOL;  Surgeon: Clarene Essex, MD;  Location: [facility] ENDOSCOPY;  Service: Endoscopy;  Laterality: N/A;  ? ESOPHAGOGASTRODUODENOSCOPY N/A 02/11/2015  ? Procedure: ESOPHAGOGASTRODUODENOSCOPY (EGD);  Surgeon: Clarene Essex, MD;  Location: [facility] ENDOSCOPY;  Service: Endoscopy;  Laterality: N/A;  ? Laser endometriosis    ? SEPTOPLASTY    ? SHOULDER ARTHROSCOPY WITH BICEPS TENDON REPAIR Left 10/14/2018  ? Procedure: SHOULDER ARTHROSCOPY BICEP TENODESIS, SHOULDER DECOMPRESSION  AND SHOULDER MANIPULATION;  Surgeon: Renette Butters, MD;  Location: [place];  Service: Orthopedics;  Laterality: Left;  REGIONAL BLOCK  ? TUBAL LIGATION    ? VAGINAL HYSTERECTOMY  2006  ? ? ?Social History ?Katherine Vaughan  reports that she has never smoked. She has never used smokeless tobacco. She reports that she does not drink alcohol and does not use drugs. ? ?family history includes Arthritis in her father and mother; Atrial fibrillation in her father; Breast cancer (age of onset: 41) in her sister; COPD in her father; Colon polyps in her brother, father, mother, sister, and sister; Diabetes in her maternal grandfather; Diverticulitis in her father and mother; Diverticulitis (age of onset: 56) in her brother and sister; Heart disease in her father and maternal grandfather; Hyperlipidemia in her maternal grandfather; Kidney cancer (age of onset: 74) in  her sister; Ovarian cancer (age of onset: 60) in an other family member; Pancreatic cancer (age of onset: 48) in her paternal uncle; Stomach cancer (age of onset: 87) in her paternal uncle; Stroke in an other family member. ? ?Allergies  ?Allergen Reactions  ? Demerol   ? Percocet [Oxycodone-Acetaminophen]   ? Sulfa Antibiotics   ? ? ?  ? ?PHYSICAL EXAMINATION: ?Vital signs: BP 100/65   Pulse (!) 59   Ht 5' 3.5" (1.613 m)   Wt 112 lb 8 oz (51 kg)   SpO2 99%   BMI 19.62 kg/m?   ?Constitutional: generally well-appearing, no acute  distress ?Psychiatric: alert and oriented x3, cooperative ?Eyes: extraocular movements intact, anicteric, conjunctiva pink ?Mouth: oral pharynx moist, no lesions ?Neck: supple no lymphadenopathy ?Cardiovascular: heart regular rate and rhythm, no murmur ?Lungs: clear to auscultation bilaterally ?Abdomen: soft, nontender, nondistended, no obvious ascites, no peritoneal signs, normal bowel sounds, no organomegaly ?Rectal: Omitted ?Extremities: no clubbing, cyanosis, or lower extremity edema bilaterally ?Skin: no lesions on visible extremities ?Neuro: No focal deficits.  Cranial nerves intact ? ?ASSESSMENT: ? ?1.  Chronic GERD.  Normal esophagus on EGD 2016.  Requires PPI to control symptoms.  No alarm features. ?2.  Family history of adenomatous colon polyps in multiple first-degree relatives less than age 16.  Screening up-to-date.  Last examination November 2021 was without neoplasia ? ? ?PLAN: ? ?1.  Reflux precautions ?2.  Refill/prescribed Nexium 40 mg daily.  Medication risk reviewed ?3.  Routine GI office follow-up 2 years.  Contact the office in the interim for questions or problems ?4.  Repeat screening colonoscopy around November 2026 ? ? ? ? ?  ? ?

## 2021-10-16 ENCOUNTER — Other Ambulatory Visit (HOSPITAL_COMMUNITY): Payer: Self-pay

## 2021-10-17 ENCOUNTER — Ambulatory Visit
Admission: RE | Admit: 2021-10-17 | Discharge: 2021-10-17 | Disposition: A | Discharge status: Home / Self Care | Payer: 59 | Source: Ambulatory Visit | Attending: Obstetrics and Gynecology | Admitting: Obstetrics and Gynecology

## 2021-10-17 DIAGNOSIS — R928 Other abnormal and inconclusive findings on diagnostic imaging of breast: Secondary | ICD-10-CM

## 2021-10-17 DIAGNOSIS — R921 Mammographic calcification found on diagnostic imaging of breast: Secondary | ICD-10-CM | POA: Diagnosis not present

## 2021-10-19 ENCOUNTER — Other Ambulatory Visit (HOSPITAL_COMMUNITY): Payer: Self-pay

## 2021-10-19 ENCOUNTER — Other Ambulatory Visit (HOSPITAL_COMMUNITY): Payer: Self-pay | Admitting: Obstetrics and Gynecology

## 2021-10-19 ENCOUNTER — Other Ambulatory Visit: Payer: Self-pay | Admitting: Obstetrics and Gynecology

## 2021-10-19 DIAGNOSIS — Z803 Family history of malignant neoplasm of breast: Secondary | ICD-10-CM

## 2021-11-01 ENCOUNTER — Ambulatory Visit (HOSPITAL_COMMUNITY)
Admission: RE | Admit: 2021-11-01 | Discharge: 2021-11-01 | Disposition: A | Discharge status: Home / Self Care | Payer: 59 | Source: Ambulatory Visit | Attending: Obstetrics and Gynecology | Admitting: Obstetrics and Gynecology

## 2021-11-01 DIAGNOSIS — Z1239 Encounter for other screening for malignant neoplasm of breast: Secondary | ICD-10-CM | POA: Diagnosis not present

## 2021-11-01 DIAGNOSIS — R928 Other abnormal and inconclusive findings on diagnostic imaging of breast: Secondary | ICD-10-CM | POA: Diagnosis not present

## 2021-11-01 DIAGNOSIS — Z803 Family history of malignant neoplasm of breast: Secondary | ICD-10-CM | POA: Insufficient documentation

## 2021-11-01 MED ORDER — GADOBUTROL 1 MMOL/ML IV SOLN
5.0000 mL | Freq: Once | INTRAVENOUS | Status: AC | PRN
  Administered 2021-11-01: 5 mL via INTRAVENOUS

## 2021-11-08 DIAGNOSIS — D1801 Hemangioma of skin and subcutaneous tissue: Secondary | ICD-10-CM | POA: Diagnosis not present

## 2021-11-08 DIAGNOSIS — Z85828 Personal history of other malignant neoplasm of skin: Secondary | ICD-10-CM | POA: Diagnosis not present

## 2021-11-08 DIAGNOSIS — D225 Melanocytic nevi of trunk: Secondary | ICD-10-CM | POA: Diagnosis not present

## 2021-11-08 DIAGNOSIS — D2262 Melanocytic nevi of left upper limb, including shoulder: Secondary | ICD-10-CM | POA: Diagnosis not present

## 2021-11-08 DIAGNOSIS — L821 Other seborrheic keratosis: Secondary | ICD-10-CM | POA: Diagnosis not present

## 2021-11-08 DIAGNOSIS — D2261 Melanocytic nevi of right upper limb, including shoulder: Secondary | ICD-10-CM | POA: Diagnosis not present

## 2021-12-08 ENCOUNTER — Ambulatory Visit: Payer: 59 | Admitting: Physician Assistant

## 2021-12-08 ENCOUNTER — Ambulatory Visit: Payer: Self-pay

## 2021-12-08 DIAGNOSIS — G8929 Other chronic pain: Secondary | ICD-10-CM

## 2021-12-08 DIAGNOSIS — M25562 Pain in left knee: Secondary | ICD-10-CM

## 2021-12-08 MED ORDER — LIDOCAINE HCL 1 % IJ SOLN
2.0000 mL | INTRAMUSCULAR | Status: AC | PRN
  Administered 2021-12-08: 2 mL

## 2021-12-08 MED ORDER — METHYLPREDNISOLONE ACETATE 40 MG/ML IJ SUSP
80.0000 mg | INTRAMUSCULAR | Status: AC | PRN
  Administered 2021-12-08: 80 mg via INTRA_ARTICULAR

## 2021-12-08 MED ORDER — BUPIVACAINE HCL 0.25 % IJ SOLN
2.0000 mL | INTRAMUSCULAR | Status: AC | PRN
  Administered 2021-12-08: 2 mL via INTRA_ARTICULAR

## 2021-12-08 NOTE — Progress Notes (Signed)
Office Visit Note   Patient: Katherine Vaughan           Date of Birth: 1961/02/22           MRN: [id number] Visit Date: 12/08/2021              Requested by: Binnie Rail, MD [place],  [place] [id number] PCP: Binnie Rail, MD  Chief Complaint  Patient presents with  . Left Knee - Pain      HPI: Patient is a pleasant 61 year old woman who works as a Marine scientist at [employer].  She relates a history of left knee pain has been going on for years.  She said about a year ago she got hit by a grocery cart and fell to the ground that seem to make the left knee hurt a bit more.  She does get some swelling from time to time.  She denies any instability.  She does feel some catching type sensations when she goes down the stairs.  Most of her pain is divided between beneath the kneecap and the posterior lateral and posterior medial joint line  Assessment & Plan: Visit Diagnoses:  1. Chronic pain of left knee     Plan: I recommend to her trying a steroid injection today.  We will also start her on some close chain strengthening to strengthen up her quadricep.  She will do this 3 times a day.  She will follow-up in 1 month with Dr. Durward Fortes.  If she does not have improvement or gets worse we could consider an MRI to rule out meniscal pathology  Follow-Up Instructions: 1 month  Ortho Exam  Patient is alert, oriented, no adenopathy, well-dressed, normal affect, normal respiratory effort. Examination of her left knee she has no redness no effusion.  She does have crepitation over the patellofemoral joint with range of motion she has good varus valgus and anterior stability.  She has some pain over the lateral greater than the medial posterior joint lines.  Imaging: No results found. No images are attached to the encounter.  Labs: Lab Results  Component Value Date   ESRSEDRATE 2 12/29/2018   CRP <1.0 12/29/2018     Lab Results  Component Value Date   ALBUMIN 4.0  12/26/2020   ALBUMIN 4.3 12/08/2018   ALBUMIN 4.0 09/04/2017    No results found for: "MG" Lab Results  Component Value Date   VD25OH 62.15 12/26/2020   VD25OH 42 12/14/2019   VD25OH 62 07/22/2012    No results found for: "PREALBUMIN"    Latest Ref Rng & Units 12/26/2020    8:44 AM 12/14/2019    8:30 AM 12/08/2018    9:18 AM  CBC EXTENDED  WBC 4.0 - 10.5 K/uL 4.9  5.3  4.9   RBC 3.87 - 5.11 Mil/uL 4.40  4.74  4.76   Hemoglobin 12.0 - 15.0 g/dL 13.2  14.5  14.3   HCT 36.0 - 46.0 % 39.3  43.5  43.1   Platelets 150.0 - 400.0 K/uL 136.0  142  130.0   NEUT# 1.4 - 7.7 K/uL 2.8  2,788  2.8   Lymph# 0.7 - 4.0 K/uL 1.4  1,850  1.5      There is no height or weight on file to calculate BMI.  Orders:  Orders Placed This Encounter  Procedures  . XR KNEE 3 VIEW LEFT   No orders of the defined types were placed in this encounter.  Procedures: Large Joint Inj: L knee on 12/08/2021 11:22 AM Indications: pain and diagnostic evaluation Details: 25 G 1.5 in needle, anteromedial approach  Arthrogram: No  Medications: 80 mg methylPREDNISolone acetate 40 MG/ML; 2 mL lidocaine 1 %; 2 mL bupivacaine 0.25 % Outcome: tolerated well, no immediate complications Procedure, treatment alternatives, risks and benefits explained, specific risks discussed. Consent was given by the patient.    Clinical Data: No additional findings.  ROS:  All other systems negative, except as noted in the HPI. Review of Systems  Objective: Vital Signs: There were no vitals taken for this visit.  Specialty Comments:  No specialty comments available.  PMFS History: Patient Active Problem List   Diagnosis Date Noted  . Knee abrasion, left, initial encounter 02/07/2021  . Arthritis 03/23/2020  . Hiatal hernia 03/23/2020  . Migraine 03/23/2020  . Arthralgia 12/29/2018  . Thumb pain, left 05/02/2017  . Acne 08/01/2015  . Family history of malignant neoplasm of breast 01/04/2014  . Family history of  malignant neoplasm of ovary 01/04/2014  . Vitamin D deficiency disease 07/04/2011  . Osteopenia   . GERD (gastroesophageal reflux disease)    Past Medical History:  Diagnosis Date  . Arthritis   . GERD (gastroesophageal reflux disease)    on meds  . History of colon polyps 2006   adenomatous, no polyps in 2011  . History of hiatal hernia   . History of kidney stones   . Low blood pressure    occ.  . Migraines    decreased since menopause  . Osteopenia    dexa 08/2010: -2.3 L fem  . Osteoporosis    hx of -  . PONV (postoperative nausea and vomiting)    only with Demerol  . Skin cancer    Middle of chest  . Urine incontinence     Family History  Problem Relation Age of Onset  . Arthritis Mother   . Diverticulitis Mother   . Colon polyps Mother   . Arthritis Father   . COPD Father   . Atrial fibrillation Father   . Heart disease Father   . Colon polyps Father   . Diverticulitis Father   . Stroke Other   . Breast cancer Sister 41  . Kidney cancer Sister 65  . Colon polyps Sister   . Diverticulitis Sister 34       s/p colectomy  . Colon polyps Sister   . Diverticulitis Brother 80  . Colon polyps Brother   . Pancreatic cancer Paternal Uncle 71  . Stomach cancer Paternal Uncle 63  . Ovarian cancer Other 29       mat great aunt through [facility] with ovarian  . Diabetes Maternal Grandfather   . Heart disease Maternal Grandfather   . Hyperlipidemia Maternal Grandfather   . Colon cancer Neg Hx   . Esophageal cancer Neg Hx   . Rectal cancer Neg Hx     Past Surgical History:  Procedure Laterality Date  . BREAST EXCISIONAL BIOPSY Bilateral   . BREAST SURGERY     breast biopsy x's 2 (L) 92 and (R) 98  . COLONOSCOPY  2016   at [facility] polyps/polyps/small hems  . COLONOSCOPY WITH PROPOFOL N/A 02/11/2015   Procedure: COLONOSCOPY WITH PROPOFOL;  Surgeon: Clarene Essex, MD;  Location: [facility] ENDOSCOPY;  Service: Endoscopy;  Laterality: N/A;  . ESOPHAGOGASTRODUODENOSCOPY N/A  02/11/2015   Procedure: ESOPHAGOGASTRODUODENOSCOPY (EGD);  Surgeon: Clarene Essex, MD;  Location: [facility] ENDOSCOPY;  Service: Endoscopy;  Laterality: N/A;  .  Laser endometriosis    . SEPTOPLASTY    . SHOULDER ARTHROSCOPY WITH BICEPS TENDON REPAIR Left 10/14/2018   Procedure: SHOULDER ARTHROSCOPY BICEP TENODESIS, SHOULDER DECOMPRESSION  AND SHOULDER MANIPULATION;  Surgeon: Renette Butters, MD;  Location: [place];  Service: Orthopedics;  Laterality: Left;  REGIONAL BLOCK  . TUBAL LIGATION    . VAGINAL HYSTERECTOMY  2006   Social History   Occupational History  . Occupation: nurse  Tobacco Use  . Smoking status: Never  . Smokeless tobacco: Never  Vaping Use  . Vaping Use: Never used  Substance and Sexual Activity  . Alcohol use: No  . Drug use: No  . Sexual activity: Not on file

## 2021-12-20 ENCOUNTER — Other Ambulatory Visit (HOSPITAL_COMMUNITY): Payer: Self-pay

## 2021-12-21 ENCOUNTER — Other Ambulatory Visit (HOSPITAL_COMMUNITY): Payer: Self-pay

## 2021-12-26 ENCOUNTER — Encounter: Payer: Self-pay | Admitting: Internal Medicine

## 2021-12-26 NOTE — Patient Instructions (Addendum)
Blood work was ordered.     Medications changes include :  none    A referral was ordered for XX.     Someone from that office will call you to schedule an appointment.    Return in about 1 year (around 12/28/2022) for Physical Exam.   Health Maintenance, Female Adopting a healthy lifestyle and getting preventive care are important in promoting health and wellness. Ask your health care provider about: The right schedule for you to have regular tests and exams. Things you can do on your own to prevent diseases and keep yourself healthy. What should I know about diet, weight, and exercise? Eat a healthy diet  Eat a diet that includes plenty of vegetables, fruits, low-fat dairy products, and lean protein. Do not eat a lot of foods that are high in solid fats, added sugars, or sodium. Maintain a healthy weight Body mass index (BMI) is used to identify weight problems. It estimates body fat based on height and weight. Your health care provider can help determine your BMI and help you achieve or maintain a healthy weight. Get regular exercise Get regular exercise. This is one of the most important things you can do for your health. Most adults should: Exercise for at least 150 minutes each week. The exercise should increase your heart rate and make you sweat (moderate-intensity exercise). Do strengthening exercises at least twice a week. This is in addition to the moderate-intensity exercise. Spend less time sitting. Even light physical activity can be beneficial. Watch cholesterol and blood lipids Have your blood tested for lipids and cholesterol at 63 years of age, then have this test every 5 years. Have your cholesterol levels checked more often if: Your lipid or cholesterol levels are high. You are older than 63 years of age. You are at high risk for heart disease. What should I know about cancer screening? Depending on your health history and family history, you may need to  have cancer screening at various ages. This may include screening for: Breast cancer. Cervical cancer. Colorectal cancer. Skin cancer. Lung cancer. What should I know about heart disease, diabetes, and high blood pressure? Blood pressure and heart disease High blood pressure causes heart disease and increases the risk of stroke. This is more likely to develop in people who have high blood pressure readings or are overweight. Have your blood pressure checked: Every 3-5 years if you are 85-61 years of age. Every year if you are 46 years old or older. Diabetes Have regular diabetes screenings. This checks your fasting blood sugar level. Have the screening done: Once every three years after age 35 if you are at a normal weight and have a low risk for diabetes. More often and at a younger age if you are overweight or have a high risk for diabetes. What should I know about preventing infection? Hepatitis B If you have a higher risk for hepatitis B, you should be screened for this virus. Talk with your health care provider to find out if you are at risk for hepatitis B infection. Hepatitis C Testing is recommended for: Everyone born from [birth year] through 1965. Anyone with known risk factors for hepatitis C. Sexually transmitted infections (STIs) Get screened for STIs, including gonorrhea and chlamydia, if: You are sexually active and are younger than 63 years of age. You are older than 63 years of age and your health care provider tells you that you are at risk for this type of infection. Your  sexual activity has changed since you were last screened, and you are at increased risk for chlamydia or gonorrhea. Ask your health care provider if you are at risk. Ask your health care provider about whether you are at high risk for HIV. Your health care provider may recommend a prescription medicine to help prevent HIV infection. If you choose to take medicine to prevent HIV, you should first get tested for  HIV. You should then be tested every 3 months for as long as you are taking the medicine. Pregnancy If you are about to stop having your period (premenopausal) and you may become pregnant, seek counseling before you get pregnant. Take 400 to 800 micrograms (mcg) of folic acid every day if you become pregnant. Ask for birth control (contraception) if you want to prevent pregnancy. Osteoporosis and menopause Osteoporosis is a disease in which the bones lose minerals and strength with aging. This can result in bone fractures. If you are 7 years old or older, or if you are at risk for osteoporosis and fractures, ask your health care provider if you should: Be screened for bone loss. Take a calcium or vitamin D supplement to lower your risk of fractures. Be given hormone replacement therapy (HRT) to treat symptoms of menopause. Follow these instructions at home: Alcohol use Do not drink alcohol if: Your health care provider tells you not to drink. You are pregnant, may be pregnant, or are planning to become pregnant. If you drink alcohol: Limit how much you have to: 0-1 drink a day. Know how much alcohol is in your drink. In the [place] one drink equals one 12 oz bottle of beer (355 mL), one 5 oz glass of wine (148 mL), or one 1 oz glass of hard liquor (44 mL). Lifestyle Do not use any products that contain nicotine or tobacco. These products include cigarettes, chewing tobacco, and vaping devices, such as e-cigarettes. If you need help quitting, ask your health care provider. Do not use street drugs. Do not share needles. Ask your health care provider for help if you need support or information about quitting drugs. General instructions Schedule regular health, dental, and eye exams. Stay current with your vaccines. Tell your health care provider if: You often feel depressed. You have ever been abused or do not feel safe at home. Summary Adopting a healthy lifestyle and getting preventive  care are important in promoting health and wellness. Follow your health care provider's instructions about healthy diet, exercising, and getting tested or screened for diseases. Follow your health care provider's instructions on monitoring your cholesterol and blood pressure. This information is not intended to replace advice given to you by your health care provider. Make sure you discuss any questions you have with your health care provider. Document Revised: 10/10/2020 Document Reviewed: 10/10/2020 Elsevier Patient Education  [place].

## 2021-12-26 NOTE — Progress Notes (Unsigned)
Subjective:    Patient ID: Katherine Vaughan, female    DOB: 27-Nov-1960, 62 y.o.   MRN: [id number]      HPI Russell is here for a Physical exam.   Left knee pain - pain is chronic - worse.  Saw ortho.   Had injection - it did not help much.  May have meniscus tear. Pain is intermittent - nagging pain.      Medications and allergies reviewed with patient and updated if appropriate.  Current Outpatient Medications on File Prior to Visit  Medication Sig Dispense Refill   acetaminophen (TYLENOL) 500 MG tablet Take 500 mg by mouth every 6 (six) hours as needed.     Ascorbic Acid (VITAMIN C) 1000 MG tablet Take 1,000 mg by mouth daily.     Ca Cit Malate-Cholecalciferol (CALCIUM CITRATE MALATE-VIT D) 250-100 MG-UNIT TABS      Cholecalciferol (VITAMIN D3) 2000 UNITS TABS Take by mouth daily.       esomeprazole (NEXIUM) 40 MG capsule Take 1 capsule (40 mg total) by mouth daily. 90 capsule 3   estradiol (VIVELLE-DOT) 0.0375 MG/24HR Apply 1 patch twice a week 24 patch 4   FIBER SELECT GUMMIES PO Take 2 tablets by mouth at bedtime.     Multiple Vitamin (MULTIVITAMIN) tablet Take 1 tablet by mouth daily.       Multiple Vitamins-Minerals (MULTI-VITAMIN GUMMIES) CHEW Chew 1 tablet by mouth daily.     Omega-3 Fatty Acids (FISH OIL ULTRA) 1400 MG CAPS Take 1 tablet by mouth daily.     tretinoin (RETIN-A) 0.05 % cream Apply topically at bedtime.      No current facility-administered medications on file prior to visit.    Review of Systems  Constitutional:  Negative for fever.  Eyes:  Negative for visual disturbance.  Respiratory:  Negative for cough, shortness of breath and wheezing.   Cardiovascular:  Negative for chest pain, palpitations and leg swelling.  Gastrointestinal:  Negative for abdominal pain, blood in stool, constipation, diarrhea and nausea.  Genitourinary:  Negative for dysuria.  Musculoskeletal:  Positive for arthralgias (left knee pain) and back pain (low back pain - chronic).   Skin:  Negative for rash.  Neurological:  Negative for light-headedness and headaches.  Psychiatric/Behavioral:  Negative for dysphoric mood. The patient is not nervous/anxious.        Objective:   Vitals:   12/27/21 0751  BP: 102/64  Pulse: 82  Temp: 97.9 F (36.6 C)  SpO2: 99%   Filed Weights   12/27/21 0751  Weight: 111 lb 9.6 oz (50.6 kg)   Body mass index is 19.46 kg/m.  BP Readings from Last 3 Encounters:  12/27/21 102/64  09/29/21 100/65  04/23/21 118/77    Wt Readings from Last 3 Encounters:  12/27/21 111 lb 9.6 oz (50.6 kg)  09/29/21 112 lb 8 oz (51 kg)  02/07/21 113 lb 6.4 oz (51.4 kg)       Physical Exam Constitutional: She appears well-developed and well-nourished. No distress.  HENT:  Head: Normocephalic and atraumatic.  Right Ear: External ear normal. Normal ear canal and TM Left Ear: External ear normal.  Normal ear canal and TM Mouth/Throat: Oropharynx is clear and moist.  Eyes: Conjunctivae normal.  Neck: Neck supple. No tracheal deviation present. No thyromegaly present.  No carotid bruit  Cardiovascular: Normal rate, regular rhythm and normal heart sounds.   No murmur heard.  No edema. Pulmonary/Chest: Effort normal and breath sounds normal. No respiratory distress. She  has no wheezes. She has no rales.  Breast: deferred   Abdominal: Soft. She exhibits no distension. There is no tenderness.  Lymphadenopathy: She has no cervical adenopathy.  Skin: Skin is warm and dry. She is not diaphoretic.  Psychiatric: She has a normal mood and affect. Her behavior is normal.     Lab Results  Component Value Date   WBC 4.9 12/26/2020   HGB 13.2 12/26/2020   HCT 39.3 12/26/2020   PLT 136.0 (L) 12/26/2020   GLUCOSE 86 12/26/2020   CHOL 172 12/26/2020   TRIG 90.0 12/26/2020   HDL 83.20 12/26/2020   LDLCALC 71 12/26/2020   ALT 22 12/26/2020   AST 22 12/26/2020   NA 140 12/26/2020   K 3.8 12/26/2020   CL 103 12/26/2020   CREATININE 0.71  12/26/2020   BUN 16 12/26/2020   CO2 31 12/26/2020   TSH 1.61 12/26/2020         Assessment & Plan:   Physical exam: Screening blood work  ordered Exercise  regular - walks daily Weight  normal Substance abuse  none   Reviewed recommended immunizations.   Health Maintenance  Topic Date Due   INFLUENZA VACCINE  01/02/2022   DEXA SCAN  01/05/2022   MAMMOGRAM  10/18/2023   COLONOSCOPY (Pts 45-58yr Insurance coverage will need to be confirmed)  04/08/2025   TETANUS/TDAP  02/26/2027   COVID-19 Vaccine  Completed   Hepatitis C Screening  Completed   HIV Screening  Completed   Zoster Vaccines- Shingrix  Completed   HPV VACCINES  Aged Out          See Problem List for Assessment and Plan of chronic medical problems.

## 2021-12-27 ENCOUNTER — Ambulatory Visit (INDEPENDENT_AMBULATORY_CARE_PROVIDER_SITE_OTHER): Payer: 59 | Admitting: Internal Medicine

## 2021-12-27 VITALS — BP 102/64 | HR 82 | Temp 97.9°F | Ht 63.5 in | Wt 111.6 lb

## 2021-12-27 DIAGNOSIS — K219 Gastro-esophageal reflux disease without esophagitis: Secondary | ICD-10-CM

## 2021-12-27 DIAGNOSIS — Z0000 Encounter for general adult medical examination without abnormal findings: Secondary | ICD-10-CM | POA: Diagnosis not present

## 2021-12-27 DIAGNOSIS — Z136 Encounter for screening for cardiovascular disorders: Secondary | ICD-10-CM | POA: Diagnosis not present

## 2021-12-27 DIAGNOSIS — G43809 Other migraine, not intractable, without status migrainosus: Secondary | ICD-10-CM | POA: Diagnosis not present

## 2021-12-27 DIAGNOSIS — E559 Vitamin D deficiency, unspecified: Secondary | ICD-10-CM

## 2021-12-27 LAB — COMPREHENSIVE METABOLIC PANEL
ALT: 25 U/L (ref 0–35)
AST: 25 U/L (ref 0–37)
Albumin: 4.2 g/dL (ref 3.5–5.2)
Alkaline Phosphatase: 54 U/L (ref 39–117)
BUN: 15 mg/dL (ref 6–23)
CO2: 30 mEq/L (ref 19–32)
Calcium: 9.2 mg/dL (ref 8.4–10.5)
Chloride: 104 mEq/L (ref 96–112)
Creatinine, Ser: 0.76 mg/dL (ref 0.40–1.20)
GFR: 83.55 mL/min (ref >60.00)
Glucose, Bld: 82 mg/dL (ref 70–99)
Potassium: 4 mEq/L (ref 3.5–5.1)
Sodium: 140 mEq/L (ref 135–145)
Total Bilirubin: 1.1 mg/dL (ref 0.2–1.2)
Total Protein: 6.4 g/dL (ref 6.0–8.3)

## 2021-12-27 LAB — VITAMIN D 25 HYDROXY (VIT D DEFICIENCY, FRACTURES): VITD: 43.11 ng/mL (ref 30.00–100.00)

## 2021-12-27 LAB — CBC WITH DIFFERENTIAL/PLATELET
Basophils Absolute: 0 10*3/uL (ref 0.0–0.1)
Basophils Relative: 0.8 % (ref 0.0–3.0)
Eosinophils Absolute: 0.1 10*3/uL (ref 0.0–0.7)
Eosinophils Relative: 2.7 % (ref 0.0–5.0)
HCT: 41 % (ref 36.0–46.0)
Hemoglobin: 13.8 g/dL (ref 12.0–15.0)
Lymphocytes Relative: 30.7 % (ref 12.0–46.0)
Lymphs Abs: 1.4 10*3/uL (ref 0.7–4.0)
MCHC: 33.7 g/dL (ref 30.0–36.0)
MCV: 89.5 fl (ref 78.0–100.0)
Monocytes Absolute: 0.4 10*3/uL (ref 0.1–1.0)
Monocytes Relative: 8.8 % (ref 3.0–12.0)
Neutro Abs: 2.6 10*3/uL (ref 1.4–7.7)
Neutrophils Relative %: 57 % (ref 43.0–77.0)
Platelets: 132 10*3/uL — ABNORMAL LOW (ref 150.0–400.0)
RBC: 4.58 Mil/uL (ref 3.87–5.11)
RDW: 13.4 % (ref 11.5–15.5)
WBC: 4.7 10*3/uL (ref 4.0–10.5)

## 2021-12-27 LAB — LIPID PANEL
Cholesterol: 173 mg/dL (ref 0–200)
HDL: 95.6 mg/dL (ref >39.00)
LDL Cholesterol: 68 mg/dL (ref 0–99)
NonHDL: 77.71
Total CHOL/HDL Ratio: 2
Triglycerides: 47 mg/dL (ref 0.0–149.0)
VLDL: 9.4 mg/dL (ref 0.0–40.0)

## 2021-12-27 LAB — TSH: TSH: 1.35 u[IU]/mL (ref 0.35–5.50)

## 2021-12-27 NOTE — Assessment & Plan Note (Signed)
Chronic Taking vitamin D daily Check vitamin D level  

## 2021-12-27 NOTE — Assessment & Plan Note (Signed)
Chronic Following with GI GERD controlled Continue Nexium 40 mg daily

## 2021-12-27 NOTE — Assessment & Plan Note (Signed)
Chronic Occasional  Takes otc medication as needed

## 2022-01-17 ENCOUNTER — Other Ambulatory Visit (HOSPITAL_COMMUNITY): Payer: Self-pay

## 2022-01-18 ENCOUNTER — Other Ambulatory Visit (HOSPITAL_COMMUNITY): Payer: Self-pay

## 2022-01-24 ENCOUNTER — Encounter: Payer: Self-pay | Admitting: Orthopaedic Surgery

## 2022-01-24 ENCOUNTER — Ambulatory Visit: Payer: 59 | Admitting: Orthopaedic Surgery

## 2022-01-24 DIAGNOSIS — M25562 Pain in left knee: Secondary | ICD-10-CM

## 2022-01-24 DIAGNOSIS — G8929 Other chronic pain: Secondary | ICD-10-CM | POA: Diagnosis not present

## 2022-01-24 NOTE — Progress Notes (Signed)
Office Visit Note   Patient: Katherine Vaughan           Date of Birth: June 10, 1960           MRN: [id number] Visit Date: 01/24/2022              Requested by: Binnie Rail, MD [place],  [place] [address] PCP: Binnie Rail, MD   Assessment & Plan: Visit Diagnoses:  1. Chronic pain of left knee     Plan: Ms. Vanoverbeke is a chronic history of left knee pain with an exacerbation after being struck by a shopping cart in 2022.  She was seen by Bevely Palmer over a month ago with a cortisone injection.  This provided little if any relief.  The pain is mostly along the anterior and anterior medial aspect of her knee.  She has not had any effusion or feeling of instability.  Films demonstrate some subchondral sclerosis in both the medial and lateral subchondral regions of the tibia.  There may be an osteochondral lesion of the medial femoral condyle.  I think at this point is worth obtaining an MRI scan. Follow-Up Instructions: Return After MRI left knee.   Orders:  Orders Placed This Encounter  Procedures   MR Knee Left w/o contrast   No orders of the defined types were placed in this encounter.     Procedures: No procedures performed   Clinical Data: No additional findings.   Subjective: Chief Complaint  Patient presents with   Left Knee - Follow-up   Patient presents today for follow up of her left knee pain. She states that injection that was received on 12/08/2021 gave no relief of pain. She states that knee pain has gradually been getting worse. She denies having any previous surgeries on her left knee, however did have a fall a few months ago. Pain is described as nagging and aching notice either in sitting or standing positions.   Review of Systems   Objective: Vital Signs: There were no vitals taken for this visit.  Physical Exam Constitutional:      Appearance: She is well-developed.  Eyes:     Pupils: Pupils are equal, round, and reactive to light.   Pulmonary:     Effort: Pulmonary effort is normal.  Skin:    General: Skin is warm and dry.  Neurological:     Mental Status: She is alert and oriented to person, place, and time.  Psychiatric:        Behavior: Behavior normal.     Ortho Exam awake alert and oriented x3.  Comfortable sitting and in no acute distress.  Left knee was not hot red warm or swollen.  There was considerable crepitation with flexion extension beneath the patella with some mild discomfort.  Some pain along the anterior medial joint.  No instability with varus or valgus stress or no anterior drawer sign.  There was some pain along the lateral joint line diffusely.  No popliteal pain or mass.  Full quick extension actively and flex to over 100 degrees.  Motor exam intact  Specialty Comments:  No specialty comments available.  Imaging: No results found.   PMFS History: Patient Active Problem List   Diagnosis Date Noted   Pain in left knee 01/24/2022   Arthritis 03/23/2020   Hiatal hernia 03/23/2020   Migraine 03/23/2020   Arthralgia 12/29/2018   Thumb pain, left 05/02/2017   Acne 08/01/2015   Family history of malignant  neoplasm of breast 01/04/2014   Family history of malignant neoplasm of ovary 01/04/2014   Vitamin D deficiency disease 07/04/2011   Osteopenia    GERD (gastroesophageal reflux disease)    Past Medical History:  Diagnosis Date   Arthritis    GERD (gastroesophageal reflux disease)    on meds   History of colon polyps 2006   adenomatous, no polyps in 2011   History of hiatal hernia    History of kidney stones    Low blood pressure    occ.   Migraines    decreased since menopause   Osteopenia    dexa 08/2010: -2.3 L fem   Osteoporosis    hx of -   PONV (postoperative nausea and vomiting)    only with Demerol   Skin cancer    Middle of chest   Urine incontinence     Family History  Problem Relation Age of Onset   Arthritis Mother    Diverticulitis Mother    Colon polyps  Mother    Arthritis Father    COPD Father    Atrial fibrillation Father    Heart disease Father    Colon polyps Father    Diverticulitis Father    Stroke Other    Breast cancer Sister 63   Kidney cancer Sister 72   Colon polyps Sister    Diverticulitis Sister 90       s/p colectomy   Colon polyps Sister    Diverticulitis Brother 88   Colon polyps Brother    Pancreatic cancer Paternal Uncle 72   Stomach cancer Paternal Uncle 48   Ovarian cancer Other 47       mat great aunt through MGM with ovarian   Diabetes Maternal Grandfather    Heart disease Maternal Grandfather    Hyperlipidemia Maternal Grandfather    Colon cancer Neg Hx    Esophageal cancer Neg Hx    Rectal cancer Neg Hx     Past Surgical History:  Procedure Laterality Date   BREAST EXCISIONAL BIOPSY Bilateral    BREAST SURGERY     breast biopsy x's 2 (L) 92 and (R) 98   COLONOSCOPY  2016   at [facility] polyps/polyps/small hems   COLONOSCOPY WITH PROPOFOL N/A 02/11/2015   Procedure: COLONOSCOPY WITH PROPOFOL;  Surgeon: Clarene Essex, MD;  Location: [facility] ENDOSCOPY;  Service: Endoscopy;  Laterality: N/A;   ESOPHAGOGASTRODUODENOSCOPY N/A 02/11/2015   Procedure: ESOPHAGOGASTRODUODENOSCOPY (EGD);  Surgeon: Clarene Essex, MD;  Location: [facility] ENDOSCOPY;  Service: Endoscopy;  Laterality: N/A;   Laser endometriosis     SEPTOPLASTY     SHOULDER ARTHROSCOPY WITH BICEPS TENDON REPAIR Left 10/14/2018   Procedure: SHOULDER ARTHROSCOPY BICEP TENODESIS, SHOULDER DECOMPRESSION  AND SHOULDER MANIPULATION;  Surgeon: Renette Butters, MD;  Location: [place];  Service: Orthopedics;  Laterality: Left;  REGIONAL BLOCK   TUBAL LIGATION     VAGINAL HYSTERECTOMY  2006   Social History   Occupational History   Occupation: nurse  Tobacco Use   Smoking status: Never   Smokeless tobacco: Never  Vaping Use   Vaping Use: Never used  Substance and Sexual Activity   Alcohol use: No   Drug use: No   Sexual activity: Not on file

## 2022-01-26 ENCOUNTER — Telehealth: Payer: Self-pay | Admitting: Orthopaedic Surgery

## 2022-01-26 NOTE — Telephone Encounter (Signed)
Patient would like an appointment week of 8/28, with Dr. Durward Fortes to go over the MRI. Her call back number is [phone]

## 2022-01-26 NOTE — Telephone Encounter (Signed)
Contacted patient and she has made a appointment to see Dr. Durward Fortes to discuss MRI results

## 2022-01-28 ENCOUNTER — Ambulatory Visit
Admission: RE | Admit: 2022-01-28 | Discharge: 2022-01-28 | Disposition: A | Discharge status: Home / Self Care | Payer: 59 | Source: Ambulatory Visit | Attending: Orthopaedic Surgery | Admitting: Orthopaedic Surgery

## 2022-01-28 DIAGNOSIS — R600 Localized edema: Secondary | ICD-10-CM | POA: Diagnosis not present

## 2022-01-28 DIAGNOSIS — G8929 Other chronic pain: Secondary | ICD-10-CM

## 2022-02-07 ENCOUNTER — Encounter: Payer: Self-pay | Admitting: Orthopaedic Surgery

## 2022-02-07 ENCOUNTER — Ambulatory Visit: Payer: 59 | Admitting: Orthopaedic Surgery

## 2022-02-07 DIAGNOSIS — G8929 Other chronic pain: Secondary | ICD-10-CM

## 2022-02-07 DIAGNOSIS — M25562 Pain in left knee: Secondary | ICD-10-CM | POA: Diagnosis not present

## 2022-02-07 NOTE — Progress Notes (Addendum)
Office Visit Note   Patient: Katherine Vaughan           Date of Birth: 1961/05/09           MRN: [id number] Visit Date: 02/07/2022              Requested by: Binnie Rail, MD [place],  [place] [address] PCP: Binnie Rail, MD   Assessment & Plan: Visit Diagnoses:  1. Chronic pain of left knee     Plan: Katherine Vaughan comes in today for follow-up on her MRI of her left knee.  She has over a 1 year history of left knee pain after being struck by a shopping cart at [employer].  Bothers her more going up and down stairs and with bending and stooping.  Most of her pain has been anterior.  She did have some mechanical symptoms so an MRI was ordered she was here to review it today.  MRI did not show any evidence of meniscus tear or ligamentous injury.  It did show a mildly prominent medial plica with adjacent fat pad edema.  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  On exam could not really palpate a significant plica.  Discussed with her management symptoms most likely from some of her arthritis that was aggravated by her fall.  She will continue to manage this conservatively including topical anti-inflammatories.  Emphasized the importance of continued strengthening in her left knee.  Her last injection was July 7.  Was not very helpful but certainly she could return for a cortisone injection more specifically in the area of discomfort.  May provide her more relief.  All questions were answered  Follow-Up Instructions: Return if symptoms worsen or fail to improve.   Orders:  No orders of the defined types were placed in this encounter.  No orders of the defined types were placed in this encounter.     Procedures: No procedures performed   Clinical Data: No additional findings.   Subjective: Chief Complaint  Patient presents with   Left Knee - Follow-up   Patient presents today for  discussion of her MRI of her left knee. She states that she has not discovered any new symptoms of pain since her last office visit. MRI of her left knee was preformed on  01/28/2022. Today she would like to review and discuss treatment plan.  Review of Systems  All other systems reviewed and are negative.    Objective: Vital Signs: There were no vitals taken for this visit.  Physical Exam Constitutional:      Appearance: Normal appearance.  Pulmonary:     Effort: Pulmonary effort is normal.  Neurological:     General: No focal deficit present.     Mental Status: She is alert.     Ortho Exam Examination of her left knee: No effusion, warmth, or erythema.  No tenderness over the medial lateral joint line.  Good stability with anterior draw good stability with varus valgus.  She does have some tenderness along the medial patellar facet but cannot palpate or elicit a pop from plica.  Full quick extension and flexion well over 105 degrees.  No popliteal pain no calf discomfort.  Minimal patella crepitation Specialty Comments:  No specialty comments available.  Imaging: No results found.   PMFS History: Patient Active Problem List   Diagnosis Date Noted   Pain in left knee 01/24/2022  Arthritis 03/23/2020   Hiatal hernia 03/23/2020   Migraine 03/23/2020   Arthralgia 12/29/2018   Thumb pain, left 05/02/2017   Acne 08/01/2015   Family history of malignant neoplasm of breast 01/04/2014   Family history of malignant neoplasm of ovary 01/04/2014   Vitamin D deficiency disease 07/04/2011   Osteopenia    GERD (gastroesophageal reflux disease)    Past Medical History:  Diagnosis Date   Arthritis    GERD (gastroesophageal reflux disease)    on meds   History of colon polyps 2006   adenomatous, no polyps in 2011   History of hiatal hernia    History of kidney stones    Low blood pressure    occ.   Migraines    decreased since menopause   Osteopenia    dexa 08/2010: -2.3 L  fem   Osteoporosis    hx of -   PONV (postoperative nausea and vomiting)    only with Demerol   Skin cancer    Middle of chest   Urine incontinence     Family History  Problem Relation Age of Onset   Arthritis Mother    Diverticulitis Mother    Colon polyps Mother    Arthritis Father    COPD Father    Atrial fibrillation Father    Heart disease Father    Colon polyps Father    Diverticulitis Father    Stroke Other    Breast cancer Sister 28   Kidney cancer Sister 42   Colon polyps Sister    Diverticulitis Sister 31       s/p colectomy   Colon polyps Sister    Diverticulitis Brother 58   Colon polyps Brother    Pancreatic cancer Paternal Uncle 9   Stomach cancer Paternal Uncle 14   Ovarian cancer Other 41       mat great aunt through MGM with ovarian   Diabetes Maternal Grandfather    Heart disease Maternal Grandfather    Hyperlipidemia Maternal Grandfather    Colon cancer Neg Hx    Esophageal cancer Neg Hx    Rectal cancer Neg Hx     Past Surgical History:  Procedure Laterality Date   BREAST EXCISIONAL BIOPSY Bilateral    BREAST SURGERY     breast biopsy x's 2 (L) 92 and (R) 98   COLONOSCOPY  2016   at [facility] polyps/polyps/small hems   COLONOSCOPY WITH PROPOFOL N/A 02/11/2015   Procedure: COLONOSCOPY WITH PROPOFOL;  Surgeon: Clarene Essex, MD;  Location: [facility] ENDOSCOPY;  Service: Endoscopy;  Laterality: N/A;   ESOPHAGOGASTRODUODENOSCOPY N/A 02/11/2015   Procedure: ESOPHAGOGASTRODUODENOSCOPY (EGD);  Surgeon: Clarene Essex, MD;  Location: [facility] ENDOSCOPY;  Service: Endoscopy;  Laterality: N/A;   Laser endometriosis     SEPTOPLASTY     SHOULDER ARTHROSCOPY WITH BICEPS TENDON REPAIR Left 10/14/2018   Procedure: SHOULDER ARTHROSCOPY BICEP TENODESIS, SHOULDER DECOMPRESSION  AND SHOULDER MANIPULATION;  Surgeon: Renette Butters, MD;  Location: [place];  Service: Orthopedics;  Laterality: Left;  REGIONAL BLOCK   TUBAL LIGATION     VAGINAL HYSTERECTOMY  2006    Social History   Occupational History   Occupation: nurse  Tobacco Use   Smoking status: Never   Smokeless tobacco: Never  Vaping Use   Vaping Use: Never used  Substance and Sexual Activity   Alcohol use: No   Drug use: No   Sexual activity: Not on file

## 2022-03-10 ENCOUNTER — Other Ambulatory Visit (HOSPITAL_COMMUNITY): Payer: Self-pay

## 2022-03-12 ENCOUNTER — Other Ambulatory Visit (HOSPITAL_COMMUNITY): Payer: Self-pay

## 2022-03-13 ENCOUNTER — Other Ambulatory Visit (HOSPITAL_COMMUNITY): Payer: Self-pay

## 2022-04-04 ENCOUNTER — Other Ambulatory Visit (HOSPITAL_COMMUNITY): Payer: Self-pay

## 2022-04-04 MED ORDER — AMOXICILLIN 500 MG PO CAPS
500.0000 mg | ORAL_CAPSULE | Freq: Three times a day (TID) | ORAL | 0 refills | Status: DC
  Filled 2022-04-04: qty 30, 10d supply, fill #0

## 2022-04-16 ENCOUNTER — Other Ambulatory Visit (HOSPITAL_COMMUNITY): Payer: Self-pay

## 2022-04-18 DIAGNOSIS — Z681 Body mass index (BMI) 19 or less, adult: Secondary | ICD-10-CM | POA: Diagnosis not present

## 2022-04-18 DIAGNOSIS — Z01419 Encounter for gynecological examination (general) (routine) without abnormal findings: Secondary | ICD-10-CM | POA: Diagnosis not present

## 2022-04-19 ENCOUNTER — Other Ambulatory Visit: Payer: Self-pay

## 2022-04-19 MED ORDER — COVID-19 MRNA 2023-2024 VACCINE (COMIRNATY) 0.3 ML INJECTION
0.3000 mL | Freq: Once | INTRAMUSCULAR | 0 refills | Status: AC
  Filled 2022-04-19: qty 0.3, 1d supply, fill #0

## 2022-04-20 ENCOUNTER — Other Ambulatory Visit: Payer: Self-pay | Admitting: Obstetrics and Gynecology

## 2022-04-20 DIAGNOSIS — Z13820 Encounter for screening for osteoporosis: Secondary | ICD-10-CM

## 2022-05-21 ENCOUNTER — Encounter: Payer: Self-pay | Admitting: Internal Medicine

## 2022-05-21 ENCOUNTER — Ambulatory Visit: Payer: 59 | Admitting: Internal Medicine

## 2022-05-21 VITALS — BP 118/66 | HR 78 | Temp 97.9°F | Ht 63.5 in | Wt 113.0 lb

## 2022-05-21 DIAGNOSIS — D696 Thrombocytopenia, unspecified: Secondary | ICD-10-CM | POA: Diagnosis not present

## 2022-05-21 DIAGNOSIS — J060 Acute laryngopharyngitis: Secondary | ICD-10-CM | POA: Diagnosis not present

## 2022-05-21 LAB — CBC WITH DIFFERENTIAL/PLATELET
Basophils Absolute: 0 10*3/uL (ref 0.0–0.1)
Basophils Relative: 0.8 % (ref 0.0–3.0)
Eosinophils Absolute: 0.1 10*3/uL (ref 0.0–0.7)
Eosinophils Relative: 1.7 % (ref 0.0–5.0)
HCT: 42.3 % (ref 36.0–46.0)
Hemoglobin: 14.2 g/dL (ref 12.0–15.0)
Lymphocytes Relative: 24.6 % (ref 12.0–46.0)
Lymphs Abs: 1.2 10*3/uL (ref 0.7–4.0)
MCHC: 33.6 g/dL (ref 30.0–36.0)
MCV: 90 fl (ref 78.0–100.0)
Monocytes Absolute: 0.5 10*3/uL (ref 0.1–1.0)
Monocytes Relative: 11 % (ref 3.0–12.0)
Neutro Abs: 3.1 10*3/uL (ref 1.4–7.7)
Neutrophils Relative %: 61.9 % (ref 43.0–77.0)
Platelets: 150 10*3/uL (ref 150.0–400.0)
RBC: 4.7 Mil/uL (ref 3.87–5.11)
RDW: 13.6 % (ref 11.5–15.5)
WBC: 5 10*3/uL (ref 4.0–10.5)

## 2022-05-21 LAB — POCT RAPID STREP A (OFFICE): Rapid Strep A Screen: NEGATIVE

## 2022-05-21 LAB — FOLATE: Folate: 20.3 ng/mL (ref >5.9)

## 2022-05-21 LAB — POC COVID19 BINAXNOW: SARS Coronavirus 2 Ag: NEGATIVE

## 2022-05-21 LAB — VITAMIN B12: Vitamin B-12: 529 pg/mL (ref 211–911)

## 2022-05-21 LAB — POCT RESPIRATORY SYNCYTIAL VIRUS: RSV Rapid Ag: NEGATIVE

## 2022-05-21 NOTE — Progress Notes (Signed)
Subjective:  Patient ID: Katherine Vaughan, female    DOB: 02-18-1961  Age: 61 y.o. MRN: [id number]  CC: URI   HPI Katherine Vaughan presents for f/up -  She complains of a 3-day history of sore throat, laryngitis, nonproductive cough.  Outpatient Medications Prior to Visit  Medication Sig Dispense Refill   acetaminophen (TYLENOL) 500 MG tablet Take 500 mg by mouth every 6 (six) hours as needed.     amoxicillin (AMOXIL) 500 MG capsule Take 1 capsule (500 mg total) by mouth 3 (three) times daily. 30 capsule 0   Ascorbic Acid (VITAMIN C) 1000 MG tablet Take 1,000 mg by mouth daily.     Cholecalciferol (VITAMIN D3) 2000 UNITS TABS Take by mouth daily.       esomeprazole (NEXIUM) 40 MG capsule Take 1 capsule (40 mg total) by mouth daily. 90 capsule 3   estradiol (VIVELLE-DOT) 0.0375 MG/24HR Apply 1 patch twice a week 24 patch 4   FIBER SELECT GUMMIES PO Take 2 tablets by mouth at bedtime.     Multiple Vitamin (MULTIVITAMIN) tablet Take 1 tablet by mouth daily.       Omega-3 Fatty Acids (FISH OIL ULTRA) 1400 MG CAPS Take 1 tablet by mouth daily.     tretinoin (RETIN-A) 0.05 % cream Apply topically at bedtime.      Ca Cit Malate-Cholecalciferol (CALCIUM CITRATE MALATE-VIT Vaughan) 250-100 MG-UNIT TABS      Multiple Vitamins-Minerals (MULTI-VITAMIN GUMMIES) CHEW Chew 1 tablet by mouth daily.     No facility-administered medications prior to visit.    ROS Review of Systems  Constitutional:  Negative for appetite change, chills, diaphoresis, fatigue and fever.  HENT:  Positive for sore throat and voice change. Negative for trouble swallowing.   Respiratory:  Positive for cough. Negative for chest tightness, shortness of breath and wheezing.   Cardiovascular:  Negative for chest pain, palpitations and leg swelling.  Gastrointestinal:  Negative for abdominal pain, diarrhea and vomiting.  Genitourinary:  Negative for difficulty urinating.  Musculoskeletal: Negative.  Negative for arthralgias.   Skin: Negative.  Negative for color change and rash.  Neurological:  Negative for dizziness, weakness and headaches.  Hematological:  Negative for adenopathy. Does not bruise/bleed easily.  Psychiatric/Behavioral: Negative.      Objective:  BP 118/66 (BP Location: Right Arm, Patient Position: Sitting, Cuff Size: Normal)   Pulse 78   Temp 97.9 F (36.6 C) (Oral)   Ht 5' 3.5" (1.613 m)   Wt 113 lb (51.3 kg)   SpO2 99%   BMI 19.70 kg/m   BP Readings from Last 3 Encounters:  05/21/22 118/66  12/27/21 102/64  09/29/21 100/65    Wt Readings from Last 3 Encounters:  05/21/22 113 lb (51.3 kg)  12/27/21 111 lb 9.6 oz (50.6 kg)  09/29/21 112 lb 8 oz (51 kg)    Physical Exam Vitals reviewed.  Constitutional:      Appearance: She is not ill-appearing.  HENT:     Mouth/Throat:     Lips: Pink.     Pharynx: Posterior oropharyngeal erythema present. No pharyngeal swelling, oropharyngeal exudate or uvula swelling.     Tonsils: No tonsillar exudate or tonsillar abscesses.  Eyes:     General: No scleral icterus.    Conjunctiva/sclera: Conjunctivae normal.  Cardiovascular:     Rate and Rhythm: Normal rate and regular rhythm.     Heart sounds: No murmur heard. Pulmonary:     Effort: Pulmonary effort is normal.  Breath sounds: No stridor. No wheezing, rhonchi or rales.  Abdominal:     General: Abdomen is flat.     Palpations: There is no mass.     Tenderness: There is no abdominal tenderness. There is no guarding.     Hernia: No hernia is present.  Musculoskeletal:        General: Normal range of motion.     Cervical back: Neck supple.     Right lower leg: No edema.     Left lower leg: No edema.  Lymphadenopathy:     Cervical: No cervical adenopathy.  Skin:    General: Skin is warm and dry.     Coloration: Skin is not pale.     Findings: No erythema.  Neurological:     General: No focal deficit present.     Mental Status: She is alert. Mental status is at baseline.   Psychiatric:        Mood and Affect: Mood normal.        Behavior: Behavior normal.     Lab Results  Component Value Date   WBC 5.0 05/21/2022   HGB 14.2 05/21/2022   HCT 42.3 05/21/2022   PLT 150.0 05/21/2022   GLUCOSE 82 12/27/2021   CHOL 173 12/27/2021   TRIG 47.0 12/27/2021   HDL 95.60 12/27/2021   LDLCALC 68 12/27/2021   ALT 25 12/27/2021   AST 25 12/27/2021   NA 140 12/27/2021   K 4.0 12/27/2021   CL 104 12/27/2021   CREATININE 0.76 12/27/2021   BUN 15 12/27/2021   CO2 30 12/27/2021   TSH 1.35 12/27/2021    MR Knee Left w/o contrast  Result Date: 01/29/2022 CLINICAL DATA:  Left knee pain, rule out meniscus tear EXAM: MRI OF THE LEFT KNEE WITHOUT CONTRAST TECHNIQUE: Multiplanar, multisequence MR imaging of the knee was performed. No intravenous contrast was administered. COMPARISON:  Left knee radiograph FINDINGS: MENISCI Medial: Intact. Lateral: Intact. LIGAMENTS Cruciates: ACL and PCL are intact. Collaterals: Medial collateral ligament is intact. Lateral collateral ligament complex is intact. CARTILAGE Patellofemoral: Moderate chondrosis with areas of fissuring along the medial and lateral patellar facets. Minimal subchondral marrow edema in the patella. Medial:  No chondral defect. Lateral: Mild chondrosis with focal chondral fibrillation and fissuring along the weight-bearing lateral femoral condyle with minimal associated subchondral marrow edema. JOINT: No significant joint effusion. There is mild medial fat pad edema along the medial plica, which is mildly prominent. POPLITEAL FOSSA: Small Baker's cyst. EXTENSOR MECHANISM: Intact quadriceps tendon. Intact patellar tendon. BONES: Mild marginal osteophyte formation. No evidence of acute fracture. No aggressive osseous lesion. Other: No fluid collection or hematoma. Muscles are normal. IMPRESSION: No evidence of meniscus tear or ligamentous injury. 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 Electronically Signed   By: Maurine Simmering M.Vaughan.   On: 01/29/2022 16:59    Assessment & Plan:   Katherine Vaughan was seen today for uri.  Diagnoses and all orders for this visit:  Thrombocytopenia ([place])- Her platelets are normal now.  B12 and folate are normal. -     CBC with Differential/Platelet; Future -     Folate; Future -     Vitamin B12; Future -     Vitamin B12 -     Folate -  CBC with Differential/Platelet  Acute laryngopharyngitis- Testing for COVID, RSV, and strep are negative.  She does not request something for symptom relief. -     POC COVID-19 -     POCT respiratory syncytial virus -     POCT rapid strep A -     CBC with Differential/Platelet; Future -     CBC with Differential/Platelet   I have discontinued Katherine Vaughan. Katherine Vaughan and Multi-Vitamin Gummies. I am also having her maintain her multivitamin, Vitamin D3, vitamin C, FIBER SELECT GUMMIES PO, tretinoin, Fish Oil Ultra, acetaminophen, estradiol, esomeprazole, and amoxicillin.  No orders of the defined types were placed in this encounter.    Follow-up: Return if symptoms worsen or fail to improve.  Scarlette Calico, MD

## 2022-05-21 NOTE — Patient Instructions (Signed)

## 2022-05-22 DIAGNOSIS — Z8041 Family history of malignant neoplasm of ovary: Secondary | ICD-10-CM | POA: Diagnosis not present

## 2022-05-22 DIAGNOSIS — Z803 Family history of malignant neoplasm of breast: Secondary | ICD-10-CM | POA: Diagnosis not present

## 2022-05-31 ENCOUNTER — Other Ambulatory Visit (HOSPITAL_COMMUNITY): Payer: Self-pay

## 2022-06-01 ENCOUNTER — Other Ambulatory Visit: Payer: Self-pay

## 2022-06-01 ENCOUNTER — Other Ambulatory Visit (HOSPITAL_COMMUNITY): Payer: Self-pay

## 2022-06-01 MED ORDER — ESTRADIOL 0.0375 MG/24HR TD PTTW
1.0000 | MEDICATED_PATCH | TRANSDERMAL | 4 refills | Status: AC
  Filled 2022-06-01: qty 24, 84d supply, fill #0

## 2022-06-08 ENCOUNTER — Telehealth: Payer: Self-pay | Admitting: Internal Medicine

## 2022-06-08 NOTE — Telephone Encounter (Signed)
[facility] needing [facility]   Pharmacy fax [phone]  Please advise

## 2022-06-11 MED ORDER — ESOMEPRAZOLE MAGNESIUM 40 MG PO CPDR: 40.0000 mg | DELAYED_RELEASE_CAPSULE | Freq: Every day | ORAL | 1 refills | Status: DC

## 2022-06-11 NOTE — Telephone Encounter (Signed)
Nexium refilled 

## 2022-06-20 ENCOUNTER — Other Ambulatory Visit (HOSPITAL_COMMUNITY): Payer: Self-pay

## 2022-06-20 ENCOUNTER — Other Ambulatory Visit (HOSPITAL_BASED_OUTPATIENT_CLINIC_OR_DEPARTMENT_OTHER): Payer: Self-pay

## 2022-06-20 MED ORDER — AREXVY 120 MCG/0.5ML IM SUSR
INTRAMUSCULAR | 0 refills | Status: DC
  Filled 2022-06-20 (×2): qty 0.5, 1d supply, fill #0

## 2022-06-21 ENCOUNTER — Other Ambulatory Visit (HOSPITAL_COMMUNITY): Payer: Self-pay

## 2022-06-21 ENCOUNTER — Other Ambulatory Visit (HOSPITAL_BASED_OUTPATIENT_CLINIC_OR_DEPARTMENT_OTHER): Payer: Self-pay

## 2022-08-03 ENCOUNTER — Other Ambulatory Visit (HOSPITAL_COMMUNITY): Payer: Self-pay

## 2022-08-03 MED ORDER — AMOXICILLIN 500 MG PO CAPS
500.0000 mg | ORAL_CAPSULE | Freq: Three times a day (TID) | ORAL | 0 refills | Status: DC
  Filled 2022-08-03 (×3): qty 21, 7d supply, fill #0

## 2022-08-03 MED ORDER — DEXAMETHASONE 4 MG PO TABS
4.0000 mg | ORAL_TABLET | Freq: Three times a day (TID) | ORAL | 0 refills | Status: DC
  Filled 2022-08-03 (×3): qty 9, 3d supply, fill #0

## 2022-08-03 MED ORDER — IBUPROFEN 400 MG PO TABS
400.0000 mg | ORAL_TABLET | ORAL | 0 refills | Status: AC
  Filled 2022-08-03 (×3): qty 30, 5d supply, fill #0

## 2022-08-09 ENCOUNTER — Other Ambulatory Visit: Payer: Self-pay | Admitting: Obstetrics and Gynecology

## 2022-08-09 DIAGNOSIS — Z1231 Encounter for screening mammogram for malignant neoplasm of breast: Secondary | ICD-10-CM

## 2022-09-20 ENCOUNTER — Telehealth: Payer: Self-pay | Admitting: Internal Medicine

## 2022-09-20 DIAGNOSIS — R739 Hyperglycemia, unspecified: Secondary | ICD-10-CM

## 2022-09-20 NOTE — Telephone Encounter (Signed)
Dr. Arelia Sneddon suggested the pt need to get her Blood sugar retested in 6 weeks pt blood sugars was a 113 pt thinks it is not call for but wanted to get advice from Dr. Lawerance Bach. Please call pt with update.

## 2022-09-20 NOTE — Telephone Encounter (Signed)
Blood work ordered - have her come in when she can

## 2022-09-21 NOTE — Telephone Encounter (Signed)
Patient scheduled for 11/13/22.

## 2022-11-13 ENCOUNTER — Other Ambulatory Visit (INDEPENDENT_AMBULATORY_CARE_PROVIDER_SITE_OTHER): Payer: 59

## 2022-11-13 DIAGNOSIS — R739 Hyperglycemia, unspecified: Secondary | ICD-10-CM

## 2022-11-13 LAB — BASIC METABOLIC PANEL
BUN: 17 mg/dL (ref 6–23)
CO2: 30 mEq/L (ref 19–32)
Calcium: 9.7 mg/dL (ref 8.4–10.5)
Chloride: 104 mEq/L (ref 96–112)
Creatinine, Ser: 0.68 mg/dL (ref 0.40–1.20)
GFR: 92.29 mL/min (ref >60.00)
Glucose, Bld: 86 mg/dL (ref 70–99)
Potassium: 4 mEq/L (ref 3.5–5.1)
Sodium: 140 mEq/L (ref 135–145)

## 2022-11-13 LAB — HEMOGLOBIN A1C: Hgb A1c MFr Bld: 5.5 % (ref 4.6–6.5)

## 2022-11-14 ENCOUNTER — Ambulatory Visit
Admission: RE | Admit: 2022-11-14 | Discharge: 2022-11-14 | Disposition: A | Discharge status: Home / Self Care | Payer: 59 | Source: Ambulatory Visit | Attending: Obstetrics and Gynecology | Admitting: Obstetrics and Gynecology

## 2022-11-14 DIAGNOSIS — Z1231 Encounter for screening mammogram for malignant neoplasm of breast: Secondary | ICD-10-CM

## 2022-11-15 ENCOUNTER — Other Ambulatory Visit: Payer: Self-pay | Admitting: Internal Medicine

## 2022-11-19 ENCOUNTER — Other Ambulatory Visit (HOSPITAL_COMMUNITY): Payer: Self-pay | Admitting: Obstetrics and Gynecology

## 2022-11-19 DIAGNOSIS — Z803 Family history of malignant neoplasm of breast: Secondary | ICD-10-CM

## 2022-12-05 ENCOUNTER — Ambulatory Visit (HOSPITAL_COMMUNITY)
Admission: RE | Admit: 2022-12-05 | Discharge: 2022-12-05 | Disposition: A | Discharge status: Home / Self Care | Payer: 59 | Source: Ambulatory Visit | Attending: Obstetrics and Gynecology | Admitting: Obstetrics and Gynecology

## 2022-12-05 DIAGNOSIS — Z1239 Encounter for other screening for malignant neoplasm of breast: Secondary | ICD-10-CM | POA: Insufficient documentation

## 2022-12-05 DIAGNOSIS — Z803 Family history of malignant neoplasm of breast: Secondary | ICD-10-CM | POA: Diagnosis present

## 2022-12-05 MED ORDER — GADOBUTROL 1 MMOL/ML IV SOLN
5.0000 mL | Freq: Once | INTRAVENOUS | Status: AC | PRN
  Administered 2022-12-05: 5 mL via INTRAVENOUS

## 2022-12-31 ENCOUNTER — Encounter: Payer: Self-pay | Admitting: Internal Medicine

## 2022-12-31 NOTE — Patient Instructions (Addendum)
Blood work was ordered.   The lab is on the first floor.    Medications changes include :   none    A referral was ordered and someone will call you to schedule an appointment.     Return in about 1 year (around 01/01/2024) for Physical Exam.    Health Maintenance, Female Adopting a healthy lifestyle and getting preventive care are important in promoting health and wellness. Ask your health care provider about: The right schedule for you to have regular tests and exams. Things you can do on your own to prevent diseases and keep yourself healthy. What should I know about diet, weight, and exercise? Eat a healthy diet  Eat a diet that includes plenty of vegetables, fruits, low-fat dairy products, and lean protein. Do not eat a lot of foods that are high in solid fats, added sugars, or sodium. Maintain a healthy weight Body mass index (BMI) is used to identify weight problems. It estimates body fat based on height and weight. Your health care provider can help determine your BMI and help you achieve or maintain a healthy weight. Get regular exercise Get regular exercise. This is one of the most important things you can do for your health. Most adults should: Exercise for at least 150 minutes each week. The exercise should increase your heart rate and make you sweat (moderate-intensity exercise). Do strengthening exercises at least twice a week. This is in addition to the moderate-intensity exercise. Spend less time sitting. Even light physical activity can be beneficial. Watch cholesterol and blood lipids Have your blood tested for lipids and cholesterol at 64 years of age, then have this test every 5 years. Have your cholesterol levels checked more often if: Your lipid or cholesterol levels are high. You are older than 64 years of age. You are at high risk for heart disease. What should I know about cancer screening? Depending on your health history and family history,  you may need to have cancer screening at various ages. This may include screening for: Breast cancer. Cervical cancer. Colorectal cancer. Skin cancer. Lung cancer. What should I know about heart disease, diabetes, and high blood pressure? Blood pressure and heart disease High blood pressure causes heart disease and increases the risk of stroke. This is more likely to develop in people who have high blood pressure readings or are overweight. Have your blood pressure checked: Every 3-5 years if you are 34-81 years of age. Every year if you are 70 years old or older. Diabetes Have regular diabetes screenings. This checks your fasting blood sugar level. Have the screening done: Once every three years after age 44 if you are at a normal weight and have a low risk for diabetes. More often and at a younger age if you are overweight or have a high risk for diabetes. What should I know about preventing infection? Hepatitis B If you have a higher risk for hepatitis B, you should be screened for this virus. Talk with your health care provider to find out if you are at risk for hepatitis B infection. Hepatitis C Testing is recommended for: Everyone born from [birth year] through 1967. Anyone with known risk factors for hepatitis C. Sexually transmitted infections (STIs) Get screened for STIs, including gonorrhea and chlamydia, if: You are sexually active and are younger than 64 years of age. You are older than 64 years of age and your health care provider tells you that you are at risk for this  type of infection. Your sexual activity has changed since you were last screened, and you are at increased risk for chlamydia or gonorrhea. Ask your health care provider if you are at risk. Ask your health care provider about whether you are at high risk for HIV. Your health care provider may recommend a prescription medicine to help prevent HIV infection. If you choose to take medicine to prevent HIV, you should  first get tested for HIV. You should then be tested every 3 months for as long as you are taking the medicine. Pregnancy If you are about to stop having your period (premenopausal) and you may become pregnant, seek counseling before you get pregnant. Take 400 to 800 micrograms (mcg) of folic acid every day if you become pregnant. Ask for birth control (contraception) if you want to prevent pregnancy. Osteoporosis and menopause Osteoporosis is a disease in which the bones lose minerals and strength with aging. This can result in bone fractures. If you are 108 years old or older, or if you are at risk for osteoporosis and fractures, ask your health care provider if you should: Be screened for bone loss. Take a calcium or vitamin D supplement to lower your risk of fractures. Be given hormone replacement therapy (HRT) to treat symptoms of menopause. Follow these instructions at home: Alcohol use Do not drink alcohol if: Your health care provider tells you not to drink. You are pregnant, may be pregnant, or are planning to become pregnant. If you drink alcohol: Limit how much you have to: 0-1 drink a day. Know how much alcohol is in your drink. In the [place] one drink equals one 12 oz bottle of beer (355 mL), one 5 oz glass of wine (148 mL), or one 1 oz glass of hard liquor (44 mL). Lifestyle Do not use any products that contain nicotine or tobacco. These products include cigarettes, chewing tobacco, and vaping devices, such as e-cigarettes. If you need help quitting, ask your health care provider. Do not use street drugs. Do not share needles. Ask your health care provider for help if you need support or information about quitting drugs. General instructions Schedule regular health, dental, and eye exams. Stay current with your vaccines. Tell your health care provider if: You often feel depressed. You have ever been abused or do not feel safe at home. Summary Adopting a healthy lifestyle  and getting preventive care are important in promoting health and wellness. Follow your health care provider's instructions about healthy diet, exercising, and getting tested or screened for diseases. Follow your health care provider's instructions on monitoring your cholesterol and blood pressure. This information is not intended to replace advice given to you by your health care provider. Make sure you discuss any questions you have with your health care provider. Document Revised: 10/10/2020 Document Reviewed: 10/10/2020 Elsevier Patient Education  2024 [employer].

## 2022-12-31 NOTE — Progress Notes (Unsigned)
Subjective:    Patient ID: Katherine Vaughan, female    DOB: 26-May-1961, 62 y.o.   MRN: [id number]      HPI Markel is here for a Physical exam and her chronic medical problems.    Doing well - no concerns.   Still has lots of stress with caring for parents.     Medications and allergies reviewed with patient and updated if appropriate.  Current Outpatient Medications on File Prior to Visit  Medication Sig Dispense Refill   acetaminophen (TYLENOL) 500 MG tablet Take 500 mg by mouth every 6 (six) hours as needed.     Ascorbic Acid (VITAMIN C) 1000 MG tablet Take 1,000 mg by mouth daily.     Calcium Citrate-Vitamin D (CALCIUM CITRATE +) 315-5 MG-MCG TABS      Cholecalciferol (VITAMIN D3) 2000 UNITS TABS Take by mouth daily.       esomeprazole (NEXIUM) 40 MG capsule TAKE 1 CAPSULE DAILY 90 capsule 3   estradiol (VIVELLE-DOT) 0.0375 MG/24HR Place 1 patch onto the skin 2 (two) times a week. 24 patch 4   FIBER SELECT GUMMIES PO Take 2 tablets by mouth at bedtime.     FORTEO 600 MCG/2.4ML SOPN      ibuprofen (ADVIL) 400 MG tablet Take 1 tablet (400 mg total) by mouth every 4 (four) hours for pain. 30 tablet 0   Multiple Vitamin (MULTIVITAMIN) tablet Take 1 tablet by mouth daily.       Omega-3 Fatty Acids (FISH OIL ULTRA) 1400 MG CAPS Take 1 tablet by mouth daily.     tretinoin (RETIN-A) 0.05 % cream Apply topically at bedtime.      No current facility-administered medications on file prior to visit.    Review of Systems  Constitutional:  Negative for fever.  Eyes:  Negative for visual disturbance.  Respiratory:  Negative for cough, shortness of breath and wheezing.   Cardiovascular:  Negative for chest pain, palpitations and leg swelling.  Gastrointestinal:  Negative for abdominal pain, blood in stool, constipation and diarrhea.       No gerd  Genitourinary:  Negative for dysuria.  Musculoskeletal:  Positive for arthralgias and back pain.  Skin:  Negative for rash.   Neurological:  Negative for light-headedness and headaches.  Psychiatric/Behavioral:  Negative for dysphoric mood. The patient is not nervous/anxious.        Objective:   Vitals:   01/01/23 0748  BP: 104/78  Pulse: 64  Temp: 97.9 F (36.6 C)  SpO2: 100%   Filed Weights   01/01/23 0748  Weight: 109 lb 12.8 oz (49.8 kg)   Body mass index is 19.15 kg/m.  BP Readings from Last 3 Encounters:  01/01/23 104/78  05/21/22 118/66  12/27/21 102/64    Wt Readings from Last 3 Encounters:  01/01/23 109 lb 12.8 oz (49.8 kg)  05/21/22 113 lb (51.3 kg)  12/27/21 111 lb 9.6 oz (50.6 kg)       Physical Exam Constitutional: She appears well-developed and well-nourished. No distress.  HENT:  Head: Normocephalic and atraumatic.  Right Ear: External ear normal. Normal ear canal and TM Left Ear: External ear normal.  Normal ear canal and TM Mouth/Throat: Oropharynx is clear and moist.  Eyes: Conjunctivae normal.  Neck: Neck supple. No tracheal deviation present. No thyromegaly present.  No carotid bruit  Cardiovascular: Normal rate, regular rhythm and normal heart sounds.   No murmur heard.  No edema. Pulmonary/Chest: Effort normal and breath sounds normal. No respiratory  distress. She has no wheezes. She has no rales.  Breast: deferred   Abdominal: Soft. She exhibits no distension. There is no tenderness.  Lymphadenopathy: She has no cervical adenopathy.  Skin: Skin is warm and dry. She is not diaphoretic.  Psychiatric: She has a normal mood and affect. Her behavior is normal.     Lab Results  Component Value Date   WBC 5.0 05/21/2022   HGB 14.2 05/21/2022   HCT 42.3 05/21/2022   PLT 150.0 05/21/2022   GLUCOSE 86 11/13/2022   CHOL 173 12/27/2021   TRIG 47.0 12/27/2021   HDL 95.60 12/27/2021   LDLCALC 68 12/27/2021   ALT 25 12/27/2021   AST 25 12/27/2021   NA 140 11/13/2022   K 4.0 11/13/2022   CL 104 11/13/2022   CREATININE 0.68 11/13/2022   BUN 17 11/13/2022    CO2 30 11/13/2022   TSH 1.35 12/27/2021   HGBA1C 5.5 11/13/2022         Assessment & Plan:   Physical exam: Screening blood work  ordered Exercise regular Weight  normal Substance abuse  none   Reviewed recommended immunizations.   Health Maintenance  Topic Date Due   DEXA SCAN  01/05/2022   INFLUENZA VACCINE  01/03/2023   MAMMOGRAM  12/04/2024   Colonoscopy  04/08/2025   DTaP/Tdap/Td (3 - Td or Tdap) 02/26/2027   COVID-19 Vaccine  Completed   Hepatitis C Screening  Completed   HIV Screening  Completed   Zoster Vaccines- Shingrix  Completed   HPV VACCINES  Aged Out          See Problem List for Assessment and Plan of chronic medical problems.

## 2023-01-01 ENCOUNTER — Ambulatory Visit (INDEPENDENT_AMBULATORY_CARE_PROVIDER_SITE_OTHER): Payer: 59 | Admitting: Internal Medicine

## 2023-01-01 VITALS — BP 104/78 | HR 64 | Temp 97.9°F | Ht 63.5 in | Wt 109.8 lb

## 2023-01-01 DIAGNOSIS — M810 Age-related osteoporosis without current pathological fracture: Secondary | ICD-10-CM

## 2023-01-01 DIAGNOSIS — E559 Vitamin D deficiency, unspecified: Secondary | ICD-10-CM

## 2023-01-01 DIAGNOSIS — Z0000 Encounter for general adult medical examination without abnormal findings: Secondary | ICD-10-CM | POA: Diagnosis not present

## 2023-01-01 DIAGNOSIS — D696 Thrombocytopenia, unspecified: Secondary | ICD-10-CM | POA: Diagnosis not present

## 2023-01-01 DIAGNOSIS — Z13220 Encounter for screening for lipoid disorders: Secondary | ICD-10-CM

## 2023-01-01 DIAGNOSIS — K219 Gastro-esophageal reflux disease without esophagitis: Secondary | ICD-10-CM

## 2023-01-01 DIAGNOSIS — M85859 Other specified disorders of bone density and structure, unspecified thigh: Secondary | ICD-10-CM

## 2023-01-01 LAB — CBC WITH DIFFERENTIAL/PLATELET
Basophils Absolute: 0 10*3/uL (ref 0.0–0.1)
Basophils Relative: 0.4 % (ref 0.0–3.0)
Eosinophils Absolute: 0.1 10*3/uL (ref 0.0–0.7)
Eosinophils Relative: 1.8 % (ref 0.0–5.0)
HCT: 40.3 % (ref 36.0–46.0)
Hemoglobin: 13.3 g/dL (ref 12.0–15.0)
Lymphocytes Relative: 31 % (ref 12.0–46.0)
Lymphs Abs: 1.6 10*3/uL (ref 0.7–4.0)
MCHC: 32.9 g/dL (ref 30.0–36.0)
MCV: 90.4 fl (ref 78.0–100.0)
Monocytes Absolute: 0.4 10*3/uL (ref 0.1–1.0)
Monocytes Relative: 7 % (ref 3.0–12.0)
Neutro Abs: 3.1 10*3/uL (ref 1.4–7.7)
Neutrophils Relative %: 59.8 % (ref 43.0–77.0)
Platelets: 150 10*3/uL (ref 150.0–400.0)
RBC: 4.46 Mil/uL (ref 3.87–5.11)
RDW: 13.6 % (ref 11.5–15.5)
WBC: 5.1 10*3/uL (ref 4.0–10.5)

## 2023-01-01 LAB — COMPREHENSIVE METABOLIC PANEL
ALT: 23 U/L (ref 0–35)
AST: 24 U/L (ref 0–37)
Albumin: 4.2 g/dL (ref 3.5–5.2)
Alkaline Phosphatase: 63 U/L (ref 39–117)
BUN: 17 mg/dL (ref 6–23)
CO2: 30 mEq/L (ref 19–32)
Calcium: 10.1 mg/dL (ref 8.4–10.5)
Chloride: 102 mEq/L (ref 96–112)
Creatinine, Ser: 0.68 mg/dL (ref 0.40–1.20)
GFR: 92.2 mL/min (ref >60.00)
Glucose, Bld: 88 mg/dL (ref 70–99)
Potassium: 3.7 mEq/L (ref 3.5–5.1)
Sodium: 139 mEq/L (ref 135–145)
Total Bilirubin: 0.8 mg/dL (ref 0.2–1.2)
Total Protein: 6.3 g/dL (ref 6.0–8.3)

## 2023-01-01 LAB — LIPID PANEL
Cholesterol: 184 mg/dL (ref 0–200)
HDL: 86.7 mg/dL (ref >39.00)
LDL Cholesterol: 87 mg/dL (ref 0–99)
NonHDL: 97.24
Total CHOL/HDL Ratio: 2
Triglycerides: 49 mg/dL (ref 0.0–149.0)
VLDL: 9.8 mg/dL (ref 0.0–40.0)

## 2023-01-01 LAB — VITAMIN D 25 HYDROXY (VIT D DEFICIENCY, FRACTURES): VITD: 66.59 ng/mL (ref 30.00–100.00)

## 2023-01-01 LAB — TSH: TSH: 1.49 u[IU]/mL (ref 0.35–5.50)

## 2023-01-01 NOTE — Assessment & Plan Note (Addendum)
Chronic dexa up to date -  Monitored by Dr Arelia Sneddon On calcium and vitamin d Continue regular exercise On estradiol On forteo

## 2023-01-01 NOTE — Assessment & Plan Note (Signed)
Chronic Taking vitamin D daily Check vitamin D level  

## 2023-01-01 NOTE — Assessment & Plan Note (Signed)
Chronic Following with GI GERD controlled Continue Nexium 40 mg daily

## 2023-01-01 NOTE — Assessment & Plan Note (Signed)
Chronic °Mild °CBC °

## 2023-01-23 ENCOUNTER — Other Ambulatory Visit (HOSPITAL_COMMUNITY): Payer: Self-pay

## 2023-01-23 ENCOUNTER — Ambulatory Visit: Payer: 59 | Admitting: Family Medicine

## 2023-01-23 VITALS — BP 102/82 | HR 103 | Temp 98.3°F | Wt 106.6 lb

## 2023-01-23 DIAGNOSIS — U071 COVID-19: Secondary | ICD-10-CM | POA: Diagnosis not present

## 2023-01-23 DIAGNOSIS — Z636 Dependent relative needing care at home: Secondary | ICD-10-CM | POA: Diagnosis not present

## 2023-01-23 MED ORDER — NIRMATRELVIR/RITONAVIR (PAXLOVID)TABLET
3.0000 | ORAL_TABLET | Freq: Two times a day (BID) | ORAL | 0 refills | Status: AC
Start: 2023-01-23 — End: 2023-01-28
  Filled 2023-01-23: qty 30, 5d supply, fill #0

## 2023-01-23 NOTE — Progress Notes (Unsigned)
Assessment/Plan:  Total time spent caring for the patient today was 30 minutes. This includes time spent before the visit reviewing the chart, time spent during the visit, and time spent after the visit on documentation, etc.  Problem List Items Addressed This Visit       Other   COVID-19 - Primary    Positive COVID-19 test today, symptoms include sore throat, fever, body aches, headache, and mild cough.  Plan:  Start Paxlovid for COVID-19 treatment Supportive care: Tylenol or Ibuprofen for fever and pain, hydration with Pedialyte or Gatorade Quarantine for 5 days from symptom onset, then wear a mask around others for another 5 days Monitor for any worsening symptoms such as chest pain, severe shortness of breath, increased fever      Relevant Medications   nirmatrelvir/ritonavir (PAXLOVID) 20 x 150 MG & 10 x 100MG  TABS   Caregiver stress    High emotional and physical stress due to primary caregiving responsibilities for father in hospice.  Plan: Referral to PCP for ongoing management and support Encourage self-care and adequate rest Consider counseling or support groups for caregivers Follow-up to monitor the patient's emotional well-being and mental health       There are no discontinued medications.  No follow-ups on file.    Subjective:   Encounter date: 01/23/2023  Katherine Vaughan is a 62 y.o. female who has Osteoporosis; GERD (gastroesophageal reflux disease); Vitamin D deficiency disease; Family history of malignant neoplasm of breast; Family history of malignant neoplasm of ovary; Acne; Thumb pain, left; Arthralgia; Arthritis; Hiatal hernia; Migraine; Pain in left knee; Thrombocytopenia (HCC); COVID-19; and Caregiver stress on their problem list..   She  has a past medical history of Arthritis, GERD (gastroesophageal reflux disease), History of colon polyps (2006), History of hiatal hernia, History of kidney stones, Low blood pressure, Migraines, Osteopenia,  Osteoporosis, PONV (postoperative nausea and vomiting), Skin cancer, and Urine incontinence.Marland Kitchen   COVID-19 Symptoms. Patient began experiencing symptoms on Monday, starting with a sore throat. Her mother had been ill prior, and tested positive for COVID-19. The patient initially took two COVID-19 tests on Monday and Tuesday, which were negative, but tested positive today. Symptoms include sore throat, headache, body aches, and a mild cough. She reports feeling cold and possibly having a fever, treated with aspirin which alleviated some symptoms. The patient has been fully vaccinated and boosted.  Caregiver Stress. The patient is under significant stress caring for her father who is in hospice. She has been closely involved in his care, including shaving and bathing him. Her father recently suffered a head injury and experienced increased bleeding in his head. He has been physically deteriorating and is no longer responsive.    Past Surgical History:  Procedure Laterality Date   BREAST EXCISIONAL BIOPSY Bilateral    BREAST SURGERY     breast biopsy x's 2 (L) 92 and (R) 98   COLONOSCOPY  2016   at [facility] polyps/polyps/small hems   COLONOSCOPY WITH PROPOFOL N/A 02/11/2015   Procedure: COLONOSCOPY WITH PROPOFOL;  Surgeon: Vida Rigger, MD;  Location: [facility] ENDOSCOPY;  Service: Endoscopy;  Laterality: N/A;   ESOPHAGOGASTRODUODENOSCOPY N/A 02/11/2015   Procedure: ESOPHAGOGASTRODUODENOSCOPY (EGD);  Surgeon: Vida Rigger, MD;  Location: [facility] ENDOSCOPY;  Service: Endoscopy;  Laterality: N/A;   Laser endometriosis     SEPTOPLASTY     SHOULDER ARTHROSCOPY WITH BICEPS TENDON REPAIR Left 10/14/2018   Procedure: SHOULDER ARTHROSCOPY BICEP TENODESIS, SHOULDER DECOMPRESSION  AND SHOULDER MANIPULATION;  Surgeon: Sheral Apley, MD;  Location: [facility];  Service: Orthopedics;  Laterality: Left;  REGIONAL BLOCK   TUBAL LIGATION     VAGINAL HYSTERECTOMY  2006    Outpatient Medications Prior to  Visit  Medication Sig Dispense Refill   acetaminophen (TYLENOL) 500 MG tablet Take 500 mg by mouth every 6 (six) hours as needed.     Ascorbic Acid (VITAMIN C) 1000 MG tablet Take 1,000 mg by mouth daily.     Calcium Citrate-Vitamin D (CALCIUM CITRATE +) 315-5 MG-MCG TABS      Cholecalciferol (VITAMIN D3) 2000 UNITS TABS Take by mouth daily.       esomeprazole (NEXIUM) 40 MG capsule TAKE 1 CAPSULE DAILY 90 capsule 3   estradiol (VIVELLE-DOT) 0.0375 MG/24HR Place 1 patch onto the skin 2 (two) times a week. 24 patch 4   FIBER SELECT GUMMIES PO Take 2 tablets by mouth at bedtime.     FORTEO 600 MCG/2.4ML SOPN      ibuprofen (ADVIL) 400 MG tablet Take 1 tablet (400 mg total) by mouth every 4 (four) hours for pain. 30 tablet 0   Multiple Vitamin (MULTIVITAMIN) tablet Take 1 tablet by mouth daily.       Omega-3 Fatty Acids (FISH OIL ULTRA) 1400 MG CAPS Take 1 tablet by mouth daily.     tretinoin (RETIN-A) 0.05 % cream Apply topically at bedtime.      No facility-administered medications prior to visit.    Family History  Problem Relation Age of Onset   Arthritis Mother    Diverticulitis Mother    Colon polyps Mother    Arthritis Father    COPD Father    Atrial fibrillation Father    Heart disease Father    Colon polyps Father    Diverticulitis Father    Stroke Other    Breast cancer Sister 67   Kidney cancer Sister 75   Colon polyps Sister    Diverticulitis Sister 41       s/p colectomy   Colon polyps Sister    Diverticulitis Brother 98   Colon polyps Brother    Pancreatic cancer Paternal Uncle 43   Stomach cancer Paternal Uncle 81   Ovarian cancer Other 70       mat great aunt through MGM with ovarian   Diabetes Maternal Grandfather    Heart disease Maternal Grandfather    Hyperlipidemia Maternal Grandfather    Colon cancer Neg Hx    Esophageal cancer Neg Hx    Rectal cancer Neg Hx     Social History   Socioeconomic History   Marital status: Married    Spouse name:  Not on file   Number of children: 0   Years of education: Not on file   Highest education level: Not on file  Occupational History   Occupation: nurse  Tobacco Use   Smoking status: Never   Smokeless tobacco: Never  Vaping Use   Vaping status: Never Used  Substance and Sexual Activity   Alcohol use: No   Drug use: No   Sexual activity: Not on file  Other Topics Concern   Not on file  Social History Narrative   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[employer] -   Married, lives with spouse - no children   Social Determinants of Health   Financial Resource Strain: Not on file  Food Insecurity: Not on file  Transportation Needs: Not on file  Physical Activity: Not on file  Stress: Not on file  Social Connections: Not on  file  Intimate Partner Violence: Not on file                                                                                                  Objective:  Physical Exam: BP 102/82 (BP Location: Left Arm, Patient Position: Sitting, Cuff Size: Large)   Pulse (!) 103   Temp 98.3 F (36.8 C) (Oral)   Wt 106 lb 9.6 oz (48.4 kg)   SpO2 98%   BMI 18.59 kg/m    Wt Readings from Last 3 Encounters:  01/23/23 106 lb 9.6 oz (48.4 kg)  01/01/23 109 lb 12.8 oz (49.8 kg)  05/21/22 113 lb (51.3 kg)    Physical Exam Constitutional:      General: She is not in acute distress.    Appearance: Normal appearance. She is not ill-appearing or toxic-appearing.  HENT:     Head: Normocephalic and atraumatic.     Nose: Nose normal. No congestion.  Eyes:     General: No scleral icterus.    Extraocular Movements: Extraocular movements intact.  Cardiovascular:     Rate and Rhythm: Regular rhythm. Tachycardia present.     Heart sounds: Normal heart sounds. No murmur heard.    No friction rub. No gallop.  Pulmonary:     Effort: Pulmonary effort is normal. No respiratory distress.     Breath sounds: Normal breath sounds.  Abdominal:     General: Abdomen is flat. Bowel sounds are normal.      Palpations: Abdomen is soft.  Musculoskeletal:        General: Normal range of motion.  Lymphadenopathy:     Cervical: No cervical adenopathy.  Skin:    General: Skin is warm and dry.     Findings: No rash.  Neurological:     General: No focal deficit present.     Mental Status: She is alert and oriented to person, place, and time. Mental status is at baseline.  Psychiatric:        Mood and Affect: Mood is anxious and depressed. Affect is tearful.        Behavior: Behavior is cooperative.     MR BREAST BILATERAL W WO CONTRAST INC CAD  Result Date: 12/07/2022 CLINICAL DATA:  62 year old with a personal history of benign excisional biopsies from both breasts in the 1990s. Family history of breast cancer in a sister at age 35. Elevated estimated lifetime risk of breast cancer of greater than 20%. Supplemental high-risk screening. EXAM: BILATERAL BREAST MRI WITH AND WITHOUT CONTRAST TECHNIQUE: Multiplanar, multisequence MR images of both breasts were obtained prior to and following the intravenous administration of 5 ml of VUEWAY IV. Three-dimensional MR images were rendered by post-processing of the original MR data on an independent workstation. The three-dimensional MR images were interpreted, and findings are reported in the following complete MRI report for this study. Three dimensional images were evaluated at the independent interpreting workstation using the DynaCAD thin client. COMPARISON:  Breast MRI 11/01/2021 and earlier. Mammography 11/14/2022 and earlier. FINDINGS: Breast composition: d. Extreme fibroglandular tissue. Background parenchymal enhancement: Moderate. RIGHT breast: No suspicious  mass or abnormal enhancement. LEFT breast: No suspicious mass or abnormal enhancement. Lymph nodes: No abnormal appearing lymph nodes. Ancillary findings:  None. IMPRESSION: No MRI evidence of malignancy involving either breast. RECOMMENDATION: 1. Annual BILATERAL screening mammography which is due in  June, 2025. 2. Annual supplemental high-risk screening MRI in 1 year as long as the patient's estimated lifetime risk of breast cancer remains greater than 20%. BI-RADS CATEGORY  2: Benign. Electronically Signed   By: Hulan Saas M.D.   On: 12/07/2022 13:25  MM 3D SCREENING MAMMOGRAM BILATERAL BREAST  Result Date: 11/15/2022 CLINICAL DATA:  Screening. EXAM: DIGITAL SCREENING BILATERAL MAMMOGRAM WITH TOMOSYNTHESIS AND CAD TECHNIQUE: Bilateral screening digital craniocaudal and mediolateral oblique mammograms were obtained. Bilateral screening digital breast tomosynthesis was performed. The images were evaluated with computer-aided detection. COMPARISON:  Previous exam(s). ACR Breast Density Category d: The breasts are extremely dense, which lowers the sensitivity of mammography. FINDINGS: There are no findings suspicious for malignancy. IMPRESSION: No mammographic evidence of malignancy. A result letter of this screening mammogram will be mailed directly to the patient. RECOMMENDATION: Screening mammogram in one year. (Code:SM-B-01Y) BI-RADS CATEGORY  1: Negative. Electronically Signed   By: Bary Richard M.D.   On: 11/15/2022 15:33    Recent Results (from the past 2160 hour(s))  Hemoglobin A1c     Status: None   Collection Time: 11/13/22  9:35 AM  Result Value Ref Range   Hgb A1c MFr Bld 5.5 4.6 - 6.5 %    Comment: Glycemic Control Guidelines for People with Diabetes:Non Diabetic:  <6%Goal of Therapy: <7%Additional Action Suggested:  >8%   Basic metabolic panel     Status: None   Collection Time: 11/13/22  9:35 AM  Result Value Ref Range   Sodium 140 135 - 145 mEq/L   Potassium 4.0 3.5 - 5.1 mEq/L   Chloride 104 96 - 112 mEq/L   CO2 30 19 - 32 mEq/L   Glucose, Bld 86 70 - 99 mg/dL   BUN 17 6 - 23 mg/dL   Creatinine, Ser 4.09 0.40 - 1.20 mg/dL   GFR 81.19 >14.78 mL/min    Comment: Calculated using the CKD-EPI Creatinine Equation (2021)   Calcium 9.7 8.4 - 10.5 mg/dL  VITAMIN D 25 Hydroxy  (Vit-D Deficiency, Fractures)     Status: None   Collection Time: 01/01/23  8:32 AM  Result Value Ref Range   VITD 66.59 30.00 - 100.00 ng/mL  TSH     Status: None   Collection Time: 01/01/23  8:32 AM  Result Value Ref Range   TSH 1.49 0.35 - 5.50 uIU/mL  Lipid panel     Status: None   Collection Time: 01/01/23  8:32 AM  Result Value Ref Range   Cholesterol 184 0 - 200 mg/dL    Comment: ATP III Classification       Desirable:  < 200 mg/dL               Borderline High:  200 - 239 mg/dL          High:  > = 295 mg/dL   Triglycerides 62.1 0.0 - 149.0 mg/dL    Comment: Normal:  <308 mg/dLBorderline High:  150 - 199 mg/dL   HDL 65.78 >46.96 mg/dL   VLDL 9.8 0.0 - 29.5 mg/dL   LDL Cholesterol 87 0 - 99 mg/dL   Total CHOL/HDL Ratio 2     Comment:  Men          Women1/2 Average Risk     3.4          3.3Average Risk          5.0          4.42X Average Risk          9.6          7.13X Average Risk          15.0          11.0                       NonHDL 97.24     Comment: NOTE:  Non-HDL goal should be 30 mg/dL higher than patient's LDL goal (i.e. LDL goal of < 70 mg/dL, would have non-HDL goal of < 100 mg/dL)  Comprehensive metabolic panel     Status: None   Collection Time: 01/01/23  8:32 AM  Result Value Ref Range   Sodium 139 135 - 145 mEq/L   Potassium 3.7 3.5 - 5.1 mEq/L   Chloride 102 96 - 112 mEq/L   CO2 30 19 - 32 mEq/L   Glucose, Bld 88 70 - 99 mg/dL   BUN 17 6 - 23 mg/dL   Creatinine, Ser 1.61 0.40 - 1.20 mg/dL   Total Bilirubin 0.8 0.2 - 1.2 mg/dL   Alkaline Phosphatase 63 39 - 117 U/L   AST 24 0 - 37 U/L   ALT 23 0 - 35 U/L   Total Protein 6.3 6.0 - 8.3 g/dL   Albumin 4.2 3.5 - 5.2 g/dL   GFR 09.60 >45.40 mL/min    Comment: Calculated using the CKD-EPI Creatinine Equation (2021)   Calcium 10.1 8.4 - 10.5 mg/dL  CBC with Differential/Platelet     Status: None   Collection Time: 01/01/23  8:32 AM  Result Value Ref Range   WBC 5.1 4.0 - 10.5 K/uL   RBC 4.46  3.87 - 5.11 Mil/uL   Hemoglobin 13.3 12.0 - 15.0 g/dL   HCT 98.1 19.1 - 47.8 %   MCV 90.4 78.0 - 100.0 fl   MCHC 32.9 30.0 - 36.0 g/dL   RDW 29.5 62.1 - 30.8 %   Platelets 150.0 150.0 - 400.0 K/uL   Neutrophils Relative % 59.8 43.0 - 77.0 %   Lymphocytes Relative 31.0 12.0 - 46.0 %   Monocytes Relative 7.0 3.0 - 12.0 %   Eosinophils Relative 1.8 0.0 - 5.0 %   Basophils Relative 0.4 0.0 - 3.0 %   Neutro Abs 3.1 1.4 - 7.7 K/uL   Lymphs Abs 1.6 0.7 - 4.0 K/uL   Monocytes Absolute 0.4 0.1 - 1.0 K/uL   Eosinophils Absolute 0.1 0.0 - 0.7 K/uL   Basophils Absolute 0.0 0.0 - 0.1 K/uL        Garner Nash, MD, MS

## 2023-01-24 DIAGNOSIS — Z636 Dependent relative needing care at home: Secondary | ICD-10-CM | POA: Insufficient documentation

## 2023-01-24 NOTE — Assessment & Plan Note (Signed)
High emotional and physical stress due to primary caregiving responsibilities for father in hospice.  Plan: Referral to PCP for ongoing management and support Encourage self-care and adequate rest Consider counseling or support groups for caregivers Follow-up to monitor the patient's emotional well-being and mental health

## 2023-01-24 NOTE — Assessment & Plan Note (Signed)
Positive COVID-19 test today, symptoms include sore throat, fever, body aches, headache, and mild cough.  Plan:  Start Paxlovid for COVID-19 treatment Supportive care: Tylenol or Ibuprofen for fever and pain, hydration with Pedialyte or Gatorade Quarantine for 5 days from symptom onset, then wear a mask around others for another 5 days Monitor for any worsening symptoms such as chest pain, severe shortness of breath, increased fever

## 2023-01-25 ENCOUNTER — Encounter: Payer: Self-pay | Admitting: Family Medicine

## 2023-02-06 ENCOUNTER — Encounter: Payer: Self-pay | Admitting: Family Medicine

## 2023-02-06 ENCOUNTER — Other Ambulatory Visit (HOSPITAL_COMMUNITY): Payer: Self-pay

## 2023-02-06 ENCOUNTER — Ambulatory Visit: Payer: 59 | Admitting: Family Medicine

## 2023-02-06 VITALS — BP 102/72 | HR 73 | Temp 97.9°F | Wt 102.4 lb

## 2023-02-06 DIAGNOSIS — U099 Post covid-19 condition, unspecified: Secondary | ICD-10-CM

## 2023-02-06 DIAGNOSIS — J029 Acute pharyngitis, unspecified: Secondary | ICD-10-CM

## 2023-02-06 DIAGNOSIS — J101 Influenza due to other identified influenza virus with other respiratory manifestations: Secondary | ICD-10-CM | POA: Diagnosis not present

## 2023-02-06 LAB — POCT RAPID STREP A (OFFICE): Rapid Strep A Screen: NEGATIVE

## 2023-02-06 LAB — POCT INFLUENZA A/B
Influenza A, POC: POSITIVE — AB
Influenza B, POC: NEGATIVE

## 2023-02-06 MED ORDER — OSELTAMIVIR PHOSPHATE 75 MG PO CAPS
75.0000 mg | ORAL_CAPSULE | Freq: Two times a day (BID) | ORAL | 0 refills | Status: DC
Start: 2023-02-06 — End: 2023-08-16
  Filled 2023-02-06: qty 10, 5d supply, fill #0

## 2023-02-06 MED ORDER — PROMETHAZINE-DM 6.25-15 MG/5ML PO SYRP
5.0000 mL | ORAL_SOLUTION | Freq: Four times a day (QID) | ORAL | 0 refills | Status: DC | PRN
Start: 2023-02-06 — End: 2023-08-16
  Filled 2023-02-06: qty 118, 6d supply, fill #0

## 2023-02-06 NOTE — Progress Notes (Unsigned)
Assessment/Plan:   Problem List Items Addressed This Visit   None Visit Diagnoses     Post-COVID syndrome    -  Primary       There are no discontinued medications.  No follow-ups on file.    Subjective:   Encounter date: 02/06/2023  Katherine Vaughan is a 62 y.o. female who has Osteoporosis; GERD (gastroesophageal reflux disease); Vitamin D deficiency disease; Family history of malignant neoplasm of breast; Family history of malignant neoplasm of ovary; Acne; Thumb pain, left; Arthralgia; Arthritis; Hiatal hernia; Migraine; Pain in left knee; Thrombocytopenia (HCC); COVID-19; and Caregiver stress on their problem list..   She  has a past medical history of Arthritis, GERD (gastroesophageal reflux disease), History of colon polyps (2006), History of hiatal hernia, History of kidney stones, Low blood pressure, Migraines, Osteopenia, Osteoporosis, PONV (postoperative nausea and vomiting), Skin cancer, and Urine incontinence..   She presents with chief complaint of Medical Management of Chronic Issues (Had covid on 8/21 and felt better and then she had a Sore throat, cough , right ear pain a couple days later. ) .   HPI:   ROS  Past Surgical History:  Procedure Laterality Date   BREAST EXCISIONAL BIOPSY Bilateral    BREAST SURGERY     breast biopsy x's 2 (L) 92 and (R) 98   COLONOSCOPY  2016   at [facility] polyps/polyps/small hems   COLONOSCOPY WITH PROPOFOL N/A 02/11/2015   Procedure: COLONOSCOPY WITH PROPOFOL;  Surgeon: Vida Rigger, MD;  Location: [facility] ENDOSCOPY;  Service: Endoscopy;  Laterality: N/A;   ESOPHAGOGASTRODUODENOSCOPY N/A 02/11/2015   Procedure: ESOPHAGOGASTRODUODENOSCOPY (EGD);  Surgeon: Vida Rigger, MD;  Location: [facility] ENDOSCOPY;  Service: Endoscopy;  Laterality: N/A;   Laser endometriosis     SEPTOPLASTY     SHOULDER ARTHROSCOPY WITH BICEPS TENDON REPAIR Left 10/14/2018   Procedure: SHOULDER ARTHROSCOPY BICEP TENODESIS, SHOULDER DECOMPRESSION  AND SHOULDER  MANIPULATION;  Surgeon: Sheral Apley, MD;  Location: [facility] ;  Service: Orthopedics;  Laterality: Left;  REGIONAL BLOCK   TUBAL LIGATION     VAGINAL HYSTERECTOMY  2006    Outpatient Medications Prior to Visit  Medication Sig Dispense Refill   acetaminophen (TYLENOL) 500 MG tablet Take 500 mg by mouth every 6 (six) hours as needed.     Ascorbic Acid (VITAMIN C) 1000 MG tablet Take 1,000 mg by mouth daily.     Calcium Citrate-Vitamin D (CALCIUM CITRATE +) 315-5 MG-MCG TABS      Cholecalciferol (VITAMIN D3) 2000 UNITS TABS Take by mouth daily.       esomeprazole (NEXIUM) 40 MG capsule TAKE 1 CAPSULE DAILY 90 capsule 3   estradiol (VIVELLE-DOT) 0.0375 MG/24HR Place 1 patch onto the skin 2 (two) times a week. 24 patch 4   FIBER SELECT GUMMIES PO Take 2 tablets by mouth at bedtime.     FORTEO 600 MCG/2.4ML SOPN      ibuprofen (ADVIL) 400 MG tablet Take 1 tablet (400 mg total) by mouth every 4 (four) hours for pain. 30 tablet 0   Multiple Vitamin (MULTIVITAMIN) tablet Take 1 tablet by mouth daily.       Omega-3 Fatty Acids (FISH OIL ULTRA) 1400 MG CAPS Take 1 tablet by mouth daily.     tretinoin (RETIN-A) 0.05 % cream Apply topically at bedtime.      No facility-administered medications prior to visit.    Family History  Problem Relation Age of Onset   Arthritis Mother    Diverticulitis Mother  Colon polyps Mother    Arthritis Father    COPD Father    Atrial fibrillation Father    Heart disease Father    Colon polyps Father    Diverticulitis Father    Stroke Other    Breast cancer Sister 46   Kidney cancer Sister 8   Colon polyps Sister    Diverticulitis Sister 31       s/p colectomy   Colon polyps Sister    Diverticulitis Brother 67   Colon polyps Brother    Pancreatic cancer Paternal Uncle 32   Stomach cancer Paternal Uncle 84   Ovarian cancer Other 70       mat great aunt through MGM with ovarian   Diabetes Maternal Grandfather    Heart disease  Maternal Grandfather    Hyperlipidemia Maternal Grandfather    Colon cancer Neg Hx    Esophageal cancer Neg Hx    Rectal cancer Neg Hx     Social History   Socioeconomic History   Marital status: Married    Spouse name: Not on file   Number of children: 0   Years of education: Not on file   Highest education level: Not on file  Occupational History   Occupation: nurse  Tobacco Use   Smoking status: Never   Smokeless tobacco: Never  Vaping Use   Vaping status: Never Used  Substance and Sexual Activity   Alcohol use: No   Drug use: No   Sexual activity: Not on file  Other Topics Concern   Not on file  Social History Narrative   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, lives with spouse - no children   Social Determinants of Health   Financial Resource Strain: Not on file  Food Insecurity: Not on file  Transportation Needs: Not on file  Physical Activity: Not on file  Stress: Not on file  Social Connections: Not on file  Intimate Partner Violence: Not on file                                                                                                  Objective:  Physical Exam: BP 102/72 (BP Location: Left Arm, Patient Position: Sitting, Cuff Size: Large)   Pulse 73   Temp 97.9 F (36.6 C) (Temporal)   Wt 102 lb 6.4 oz (46.4 kg)   SpO2 99%   BMI 17.85 kg/m      Physical Exam  MR BREAST BILATERAL W WO CONTRAST INC CAD  Result Date: 12/07/2022 CLINICAL DATA:  62 year old with a personal history of benign excisional biopsies from both breasts in the 1990s. Family history of breast cancer in a sister at age 38. Elevated estimated lifetime risk of breast cancer of greater than 20%. Supplemental high-risk screening. EXAM: BILATERAL BREAST MRI WITH AND WITHOUT CONTRAST TECHNIQUE: Multiplanar, multisequence MR images of both breasts were obtained prior to and following the intravenous administration of 5 ml of VUEWAY IV. Three-dimensional MR images were rendered by  post-processing of the original MR data on an independent workstation. The three-dimensional MR images were interpreted, and  findings are reported in the following complete MRI report for this study. Three dimensional images were evaluated at the independent interpreting workstation using the DynaCAD thin client. COMPARISON:  Breast MRI 11/01/2021 and earlier. Mammography 11/14/2022 and earlier. FINDINGS: Breast composition: d. Extreme fibroglandular tissue. Background parenchymal enhancement: Moderate. RIGHT breast: No suspicious mass or abnormal enhancement. LEFT breast: No suspicious mass or abnormal enhancement. Lymph nodes: No abnormal appearing lymph nodes. Ancillary findings:  None. IMPRESSION: No MRI evidence of malignancy involving either breast. RECOMMENDATION: 1. Annual BILATERAL screening mammography which is due in June, 2025. 2. Annual supplemental high-risk screening MRI in 1 year as long as the patient's estimated lifetime risk of breast cancer remains greater than 20%. BI-RADS CATEGORY  2: Benign. Electronically Signed   By: Hulan Saas M.D.   On: 12/07/2022 13:25  MM 3D SCREENING MAMMOGRAM BILATERAL BREAST  Result Date: 11/15/2022 CLINICAL DATA:  Screening. EXAM: DIGITAL SCREENING BILATERAL MAMMOGRAM WITH TOMOSYNTHESIS AND CAD TECHNIQUE: Bilateral screening digital craniocaudal and mediolateral oblique mammograms were obtained. Bilateral screening digital breast tomosynthesis was performed. The images were evaluated with computer-aided detection. COMPARISON:  Previous exam(s). ACR Breast Density Category d: The breasts are extremely dense, which lowers the sensitivity of mammography. FINDINGS: There are no findings suspicious for malignancy. IMPRESSION: No mammographic evidence of malignancy. A result letter of this screening mammogram will be mailed directly to the patient. RECOMMENDATION: Screening mammogram in one year. (Code:SM-B-01Y) BI-RADS CATEGORY  1: Negative. Electronically  Signed   By: Bary Richard M.D.   On: 11/15/2022 15:33    Recent Results (from the past 2160 hour(s))  Hemoglobin A1c     Status: None   Collection Time: 11/13/22  9:35 AM  Result Value Ref Range   Hgb A1c MFr Bld 5.5 4.6 - 6.5 %    Comment: Glycemic Control Guidelines for People with Diabetes:Non Diabetic:  <6%Goal of Therapy: <7%Additional Action Suggested:  >8%   Basic metabolic panel     Status: None   Collection Time: 11/13/22  9:35 AM  Result Value Ref Range   Sodium 140 135 - 145 mEq/L   Potassium 4.0 3.5 - 5.1 mEq/L   Chloride 104 96 - 112 mEq/L   CO2 30 19 - 32 mEq/L   Glucose, Bld 86 70 - 99 mg/dL   BUN 17 6 - 23 mg/dL   Creatinine, Ser 1.61 0.40 - 1.20 mg/dL   GFR 09.60 >45.40 mL/min    Comment: Calculated using the CKD-EPI Creatinine Equation (2021)   Calcium 9.7 8.4 - 10.5 mg/dL  VITAMIN D 25 Hydroxy (Vit-D Deficiency, Fractures)     Status: None   Collection Time: 01/01/23  8:32 AM  Result Value Ref Range   VITD 66.59 30.00 - 100.00 ng/mL  TSH     Status: None   Collection Time: 01/01/23  8:32 AM  Result Value Ref Range   TSH 1.49 0.35 - 5.50 uIU/mL  Lipid panel     Status: None   Collection Time: 01/01/23  8:32 AM  Result Value Ref Range   Cholesterol 184 0 - 200 mg/dL    Comment: ATP III Classification       Desirable:  < 200 mg/dL               Borderline High:  200 - 239 mg/dL          High:  > = 981 mg/dL   Triglycerides 19.1 0.0 - 149.0 mg/dL    Comment: Normal:  <478 mg/dLBorderline High:  150 - 199 mg/dL   HDL 21.30 >86.57 mg/dL   VLDL 9.8 0.0 - 84.6 mg/dL   LDL Cholesterol 87 0 - 99 mg/dL   Total CHOL/HDL Ratio 2     Comment:                Men          Women1/2 Average Risk     3.4          3.3Average Risk          5.0          4.42X Average Risk          9.6          7.13X Average Risk          15.0          11.0                       NonHDL 97.24     Comment: NOTE:  Non-HDL goal should be 30 mg/dL higher than patient's LDL goal (i.e. LDL goal of <  70 mg/dL, would have non-HDL goal of < 100 mg/dL)  Comprehensive metabolic panel     Status: None   Collection Time: 01/01/23  8:32 AM  Result Value Ref Range   Sodium 139 135 - 145 mEq/L   Potassium 3.7 3.5 - 5.1 mEq/L   Chloride 102 96 - 112 mEq/L   CO2 30 19 - 32 mEq/L   Glucose, Bld 88 70 - 99 mg/dL   BUN 17 6 - 23 mg/dL   Creatinine, Ser 9.62 0.40 - 1.20 mg/dL   Total Bilirubin 0.8 0.2 - 1.2 mg/dL   Alkaline Phosphatase 63 39 - 117 U/L   AST 24 0 - 37 U/L   ALT 23 0 - 35 U/L   Total Protein 6.3 6.0 - 8.3 g/dL   Albumin 4.2 3.5 - 5.2 g/dL   GFR 95.28 >41.32 mL/min    Comment: Calculated using the CKD-EPI Creatinine Equation (2021)   Calcium 10.1 8.4 - 10.5 mg/dL  CBC with Differential/Platelet     Status: None   Collection Time: 01/01/23  8:32 AM  Result Value Ref Range   WBC 5.1 4.0 - 10.5 K/uL   RBC 4.46 3.87 - 5.11 Mil/uL   Hemoglobin 13.3 12.0 - 15.0 g/dL   HCT 44.0 10.2 - 72.5 %   MCV 90.4 78.0 - 100.0 fl   MCHC 32.9 30.0 - 36.0 g/dL   RDW 36.6 44.0 - 34.7 %   Platelets 150.0 150.0 - 400.0 K/uL   Neutrophils Relative % 59.8 43.0 - 77.0 %   Lymphocytes Relative 31.0 12.0 - 46.0 %   Monocytes Relative 7.0 3.0 - 12.0 %   Eosinophils Relative 1.8 0.0 - 5.0 %   Basophils Relative 0.4 0.0 - 3.0 %   Neutro Abs 3.1 1.4 - 7.7 K/uL   Lymphs Abs 1.6 0.7 - 4.0 K/uL   Monocytes Absolute 0.4 0.1 - 1.0 K/uL   Eosinophils Absolute 0.1 0.0 - 0.7 K/uL   Basophils Absolute 0.0 0.0 - 0.1 K/uL        Garner Nash, MD, MS

## 2023-02-07 DIAGNOSIS — J101 Influenza due to other identified influenza virus with other respiratory manifestations: Secondary | ICD-10-CM | POA: Insufficient documentation

## 2023-02-07 DIAGNOSIS — U099 Post covid-19 condition, unspecified: Secondary | ICD-10-CM | POA: Insufficient documentation

## 2023-02-07 NOTE — Assessment & Plan Note (Signed)
Tamiflu (Oseltamivir) 75 mg, twice daily. Promethazine with dextromethorphan syrup, 5 ml, 4 times daily as needed for cough (preferably at night to aid in sleep). Over-the-counter options like Tylenol or ibuprofen for ear or throat discomfort. Flonase or Mucinex DM to address nasal congestion and cough. Monitor for worsening symptoms such as chest pain or shortness of breath. Ensure adequate rest and fluid intake. Follow up with primary care provider as needed for reassessment. Family members, particularly close contacts, advised on prophylaxis options like Tamiflu and to consult their healthcare providers.

## 2023-02-08 ENCOUNTER — Other Ambulatory Visit (HOSPITAL_COMMUNITY): Payer: Self-pay

## 2023-02-12 ENCOUNTER — Encounter: Payer: Self-pay | Admitting: Internal Medicine

## 2023-02-20 ENCOUNTER — Other Ambulatory Visit (HOSPITAL_BASED_OUTPATIENT_CLINIC_OR_DEPARTMENT_OTHER): Payer: Self-pay

## 2023-02-20 MED ORDER — INFLUENZA VIRUS VACC SPLIT PF (FLUZONE) 0.5 ML IM SUSY
0.5000 mL | PREFILLED_SYRINGE | Freq: Once | INTRAMUSCULAR | 0 refills | Status: AC
  Filled 2023-02-20: qty 0.5, 1d supply, fill #0

## 2023-04-23 ENCOUNTER — Other Ambulatory Visit (HOSPITAL_BASED_OUTPATIENT_CLINIC_OR_DEPARTMENT_OTHER): Payer: Self-pay | Admitting: Obstetrics and Gynecology

## 2023-04-23 DIAGNOSIS — Z8249 Family history of ischemic heart disease and other diseases of the circulatory system: Secondary | ICD-10-CM

## 2023-04-30 ENCOUNTER — Ambulatory Visit (HOSPITAL_COMMUNITY)
Admission: RE | Admit: 2023-04-30 | Discharge: 2023-04-30 | Disposition: A | Discharge status: Home / Self Care | Payer: 59 | Source: Ambulatory Visit | Attending: Obstetrics and Gynecology | Admitting: Obstetrics and Gynecology

## 2023-04-30 DIAGNOSIS — Z8249 Family history of ischemic heart disease and other diseases of the circulatory system: Secondary | ICD-10-CM | POA: Insufficient documentation

## 2023-05-03 ENCOUNTER — Encounter: Payer: Self-pay | Admitting: Internal Medicine

## 2023-05-10 ENCOUNTER — Other Ambulatory Visit: Payer: Self-pay | Admitting: Plastic Surgery

## 2023-05-15 LAB — DERMATOLOGY PATHOLOGY

## 2023-07-05 ENCOUNTER — Other Ambulatory Visit: Payer: Self-pay | Admitting: Plastic Surgery

## 2023-07-11 LAB — DERMATOLOGY PATHOLOGY

## 2023-07-28 NOTE — Progress Notes (Unsigned)
 Cardiology Office Note:    Date:  07/30/2023   ID:  Katherine Vaughan, DOB Mar 08, 1961, MRN [id number]  PCP:  Pincus Sanes, MD  Cardiologist:  None  Electrophysiologist:  None   Referring MD: Richardean Chimera, MD   Chief Complaint  Patient presents with   Pericardial Effusion    History of Present Illness:    Katherine Vaughan is a 63 y.o. female with a hx of GERD, nephrolithiasis, osteoporosis who is referred by Dr. Arelia Sneddon for evaluation of pericardial effusion.  Underwent calcium score on 04/30/2023, which was 0; CT was also noted for small pericardial effusion.  She reports she walks daily for 30 minutes, denies any exertional chest pain or dyspnea.  Does report she has been having back pain but otherwise has no complaints.  She denies any lightheadedness, syncope, lower extremity edema.  Reports occasional palpitations but just lasts for a second and then resolves.  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  Family's includes mother has SVT, and father had severe three-vessel disease diagnosed in his 16s and managed medically.   Past Medical History:  Diagnosis Date   Arthritis    GERD (gastroesophageal reflux disease)    on meds   History of colon polyps 2006   adenomatous, no polyps in 2011   History of hiatal hernia    History of kidney stones    Low blood pressure    occ.   Migraines    decreased since menopause   Osteopenia    dexa 08/2010: -2.3 L fem   Osteoporosis    hx of -   PONV (postoperative nausea and vomiting)    only with Demerol   Skin cancer    Middle of chest   Urine incontinence     Past Surgical History:  Procedure Laterality Date   BREAST EXCISIONAL BIOPSY Bilateral    BREAST SURGERY     breast biopsy x's 2 (L) 92 and (R) 98   COLONOSCOPY  2016   at [facility] polyps/polyps/small hems   COLONOSCOPY WITH PROPOFOL N/A 02/11/2015   Procedure: COLONOSCOPY WITH PROPOFOL;  Surgeon: Vida Rigger, MD;  Location: [facility] ENDOSCOPY;  Service: Endoscopy;   Laterality: N/A;   ESOPHAGOGASTRODUODENOSCOPY N/A 02/11/2015   Procedure: ESOPHAGOGASTRODUODENOSCOPY (EGD);  Surgeon: Vida Rigger, MD;  Location: [facility] ENDOSCOPY;  Service: Endoscopy;  Laterality: N/A;   Laser endometriosis     SEPTOPLASTY     SHOULDER ARTHROSCOPY WITH BICEPS TENDON REPAIR Left 10/14/2018   Procedure: SHOULDER ARTHROSCOPY BICEP TENODESIS, SHOULDER DECOMPRESSION  AND SHOULDER MANIPULATION;  Surgeon: Sheral Apley, MD;  Location: [facility];  Service: Orthopedics;  Laterality: Left;  REGIONAL BLOCK   TUBAL LIGATION     VAGINAL HYSTERECTOMY  2006    Current Medications: Current Meds  Medication Sig   acetaminophen (TYLENOL) 500 MG tablet Take 500 mg by mouth every 6 (six) hours as needed.   Ascorbic Acid (VITAMIN C) 1000 MG tablet Take 1,000 mg by mouth daily.   Calcium Citrate-Vitamin D (CALCIUM CITRATE +) 315-5 MG-MCG TABS    Cholecalciferol (VITAMIN D3) 2000 UNITS TABS Take by mouth daily.     esomeprazole (NEXIUM) 40 MG capsule TAKE 1 CAPSULE DAILY   estradiol (VIVELLE-DOT) 0.0375 MG/24HR Place 1 patch onto the skin 2 (two) times a week.   FIBER SELECT GUMMIES PO Take 2 tablets by mouth at bedtime.   FORTEO 600 MCG/2.4ML SOPN    ibuprofen (ADVIL) 400 MG tablet Take 1 tablet (400 mg  total) by mouth every 4 (four) hours for pain.   Multiple Vitamin (MULTIVITAMIN) tablet Take 1 tablet by mouth daily.     Omega-3 Fatty Acids (FISH OIL ULTRA) 1400 MG CAPS Take 1 tablet by mouth daily.   tretinoin (RETIN-A) 0.05 % cream Apply topically at bedtime.      Allergies:   Demerol, Percocet [oxycodone-acetaminophen], and Sulfa antibiotics   Social History   Socioeconomic History   Marital status: Married    Spouse name: Not on file   Number of children: 0   Years of education: Not on file   Highest education level: Not on file  Occupational History   Occupation: nurse  Tobacco Use   Smoking status: Never   Smokeless tobacco: Never  Vaping Use   Vaping  status: Never Used  Substance and Sexual Activity   Alcohol use: No   Drug use: No   Sexual activity: Yes    Partners: Male    Birth control/protection: Post-menopausal, Surgical    Comment: married  Other Topics Concern   Not on file  Social History Narrative   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, lives with spouse - no children   Social Drivers of Corporate investment banker Strain: Not on file  Food Insecurity: Not on file  Transportation Needs: Not on file  Physical Activity: Not on file  Stress: Not on file  Social Connections: Not on file     Family History: The patient's family history includes Arthritis in her father and mother; Atrial fibrillation in her father; Breast cancer (age of onset: 110) in her sister; COPD in her father; Colon polyps in her brother, father, mother, sister, and sister; Diabetes in her maternal grandfather; Diverticulitis in her father and mother; Diverticulitis (age of onset: 4) in her brother and sister; Heart disease in her father and maternal grandfather; Hyperlipidemia in her maternal grandfather; Kidney cancer (age of onset: 69) in her sister; Ovarian cancer (age of onset: 80) in an other family member; Pancreatic cancer (age of onset: 27) in her paternal uncle; Stomach cancer (age of onset: 73) in her paternal uncle; Stroke in an other family member. There is no history of Colon cancer, Esophageal cancer, or Rectal cancer.  ROS:   Please see the history of present illness.     All other systems reviewed and are negative.  EKGs/Labs/Other Studies Reviewed:    The following studies were reviewed today:   EKG:   07/30/23: Sinus rhythm, rate 74, Q waves in V1/2  Recent Labs: 01/01/2023: ALT 23; BUN 17; Creatinine, Ser 0.68; Hemoglobin 13.3; Platelets 150.0; Potassium 3.7; Sodium 139; TSH 1.49  Recent Lipid Panel    Component Value Date/Time   CHOL 184 01/01/2023 0832   TRIG 49.0 01/01/2023 0832   HDL 86.70 01/01/2023 0832   CHOLHDL 2  01/01/2023 0832   VLDL 9.8 01/01/2023 0832   LDLCALC 87 01/01/2023 0832   LDLCALC 83 12/14/2019 0830    Physical Exam:    VS:  BP 111/69 (BP Location: Left Arm, Patient Position: Sitting, Cuff Size: Normal)   Pulse 74   Ht 5' 3.6" (1.615 m)   Wt 108 lb 3.2 oz (49.1 kg)   BMI 18.81 kg/m     Wt Readings from Last 3 Encounters:  07/30/23 108 lb 3.2 oz (49.1 kg)  02/06/23 102 lb 6.4 oz (46.4 kg)  01/23/23 106 lb 9.6 oz (48.4 kg)     GEN:  in no acute distress HEENT:  Normal NECK: No JVD; No carotid bruits LYMPHATICS: No lymphadenopathy CARDIAC: RRR, no murmurs, rubs, gallops RESPIRATORY:  Clear to auscultation without rales, wheezing or rhonchi  ABDOMEN: Soft, non-tender, non-distended MUSCULOSKELETAL:  No edema; No deformity  SKIN: Warm and dry NEUROLOGIC:  Alert and oriented x 3 PSYCHIATRIC:  Normal affect   ASSESSMENT:    1. Pericardial effusion   2. Palpitations    PLAN:    Pericardial effusion: Underwent calcium score on 04/30/2023, which was 0; CT was also noted for small pericardial effusion. -Recommend echocardiogram for further evaluation.  Check ESR/CRP  Palpitations: Reports occasional palpitations that last for a second, suspect likely PAC or PVC.  Can plan to monitor if increased frequency or duration  RTC as needed   Medication Adjustments/Labs and Tests Ordered: Current medicines are reviewed at length with the patient today.  Concerns regarding medicines are outlined above.  Orders Placed This Encounter  Procedures   Sed Rate (ESR)   C-reactive protein   EKG 12-Lead   ECHOCARDIOGRAM COMPLETE   No orders of the defined types were placed in this encounter.   Patient Instructions  Medication Instructions:  Continue current medications *If you need a refill on your cardiac medications before your next appointment, please call your pharmacy*   Lab Work: Esr, crp If you have labs (blood work) drawn today and your tests are completely normal,  you will receive your results only by: MyChart Message (if you have MyChart) OR A paper copy in the mail If you have any lab test that is abnormal or we need to change your treatment, we will call you to review the results.   Testing/Procedures: Echo  Your physician has requested that you have an echocardiogram. Echocardiography is a painless test that uses sound waves to create images of your heart. It provides your doctor with information about the size and shape of your heart and how well your heart's chambers and valves are working. This procedure takes approximately one hour. There are no restrictions for this procedure. Please do NOT wear cologne, perfume, aftershave, or lotions (deodorant is allowed). Please arrive 15 minutes prior to your appointment time.  Please note: We ask at that you not bring children with you during ultrasound (echo/ vascular) testing. Due to room size and safety concerns, children are not allowed in the ultrasound rooms during exams. Our front office staff cannot provide observation of children in our lobby area while testing is being conducted. An adult accompanying a patient to their appointment will only be allowed in the ultrasound room at the discretion of the ultrasound technician under special circumstances. We apologize for any inconvenience.    Follow-Up: At [facility], you and your health needs are our priority.  As part of our continuing mission to provide you with exceptional heart care, we have created designated Provider Care Teams.  These Care Teams include your primary Cardiologist (physician) and Advanced Practice Providers (APPs -  Physician Assistants and Nurse Practitioners) who all work together to provide you with the care you need, when you need it.  We recommend signing up for the patient portal called "MyChart".  Sign up information is provided on this After Visit Summary.  MyChart is used to connect with patients for Virtual  Visits (Telemedicine).  Patients are able to view lab/test results, encounter notes, upcoming appointments, etc.  Non-urgent messages can be sent to your provider as well.   To learn more about what you can do with MyChart, go to  [email or web address].    Your next appointment:   As needed  Provider:   Dr. Bjorn Pippin  Other Instructions none       Signed, Little Ishikawa, MD  07/30/2023 2:09 PM    [facility]

## 2023-07-30 ENCOUNTER — Encounter: Payer: Self-pay | Admitting: Cardiology

## 2023-07-30 ENCOUNTER — Ambulatory Visit: Payer: 59 | Attending: Cardiology | Admitting: Cardiology

## 2023-07-30 VITALS — BP 111/69 | HR 74 | Ht 63.6 in | Wt 108.2 lb

## 2023-07-30 DIAGNOSIS — I3139 Other pericardial effusion (noninflammatory): Secondary | ICD-10-CM | POA: Diagnosis not present

## 2023-07-30 DIAGNOSIS — R002 Palpitations: Secondary | ICD-10-CM

## 2023-07-30 NOTE — Patient Instructions (Signed)
 Medication Instructions:  Continue current medications *If you need a refill on your cardiac medications before your next appointment, please call your pharmacy*   Lab Work: Esr, crp If you have labs (blood work) drawn today and your tests are completely normal, you will receive your results only by: MyChart Message (if you have MyChart) OR A paper copy in the mail If you have any lab test that is abnormal or we need to change your treatment, we will call you to review the results.   Testing/Procedures: Echo  Your physician has requested that you have an echocardiogram. Echocardiography is a painless test that uses sound waves to create images of your heart. It provides your doctor with information about the size and shape of your heart and how well your heart's chambers and valves are working. This procedure takes approximately one hour. There are no restrictions for this procedure. Please do NOT wear cologne, perfume, aftershave, or lotions (deodorant is allowed). Please arrive 15 minutes prior to your appointment time.  Please note: We ask at that you not bring children with you during ultrasound (echo/ vascular) testing. Due to room size and safety concerns, children are not allowed in the ultrasound rooms during exams. Our front office staff cannot provide observation of children in our lobby area while testing is being conducted. An adult accompanying a patient to their appointment will only be allowed in the ultrasound room at the discretion of the ultrasound technician under special circumstances. We apologize for any inconvenience.    Follow-Up: At [facility], you and your health needs are our priority.  As part of our continuing mission to provide you with exceptional heart care, we have created designated Provider Care Teams.  These Care Teams include your primary Cardiologist (physician) and Advanced Practice Providers (APPs -  Physician Assistants and Nurse  Practitioners) who all work together to provide you with the care you need, when you need it.  We recommend signing up for the patient portal called "MyChart".  Sign up information is provided on this After Visit Summary.  MyChart is used to connect with patients for Virtual Visits (Telemedicine).  Patients are able to view lab/test results, encounter notes, upcoming appointments, etc.  Non-urgent messages can be sent to your provider as well.   To learn more about what you can do with MyChart, go to [email or web address].    Your next appointment:   As needed  Provider:   Dr. Bjorn Pippin  Other Instructions none

## 2023-07-31 LAB — C-REACTIVE PROTEIN: CRP: <1 mg/L (ref 0–10)

## 2023-07-31 LAB — SEDIMENTATION RATE: Sed Rate: 2 mm/h (ref 0–40)

## 2023-08-16 ENCOUNTER — Telehealth: Admitting: Nurse Practitioner

## 2023-08-16 DIAGNOSIS — J019 Acute sinusitis, unspecified: Secondary | ICD-10-CM | POA: Diagnosis not present

## 2023-08-16 DIAGNOSIS — B9689 Other specified bacterial agents as the cause of diseases classified elsewhere: Secondary | ICD-10-CM | POA: Insufficient documentation

## 2023-08-16 MED ORDER — DOXYCYCLINE HYCLATE 100 MG PO TABS: 100.0000 mg | ORAL_TABLET | Freq: Two times a day (BID) | ORAL | 0 refills | Status: DC

## 2023-08-16 NOTE — Progress Notes (Signed)
   Established Patient Office Visit  An audio/visual tele-health visit was completed today for this patient. I connected with  Katherine Vaughan on 08/16/23 utilizing audio/visual technology and verified that I am speaking with the correct person using two identifiers. The patient was located at the hospital visiting her mom, and I was located at the office of [facility] during the encounter. I discussed the limitations of evaluation and management by telemedicine. The patient expressed understanding and agreed to proceed.    Subjective   Patient ID: Katherine Vaughan, female    DOB: [birth year]  Age: 63 y.o. MRN: [id number]  Chief Complaint  Patient presents with   Cough    Been two weeks since cough, same sore throat, nasal / chest congestion. Fatigue     Symptom onset >2 weeks ago. Has congestion, sore throat, sinus pain, productive cough. Took at-home covid test x 2 both of which was negative. She has been treating with mucinex and saline nasal spray.     Review of Systems  Constitutional:  Negative for chills and fever.  HENT:  Positive for congestion, sinus pain and sore throat.   Respiratory:  Positive for cough and sputum production (green). Negative for shortness of breath and wheezing.   Cardiovascular:  Negative for chest pain.  Psychiatric/Behavioral:  The patient does not have insomnia.       Objective:     There were no vitals taken for this visit. BP Readings from Last 3 Encounters:  07/30/23 111/69  02/06/23 102/72  01/23/23 102/82   Wt Readings from Last 3 Encounters:  07/30/23 108 lb 3.2 oz (49.1 kg)  02/06/23 102 lb 6.4 oz (46.4 kg)  01/23/23 106 lb 9.6 oz (48.4 kg)      Physical Exam Comprehensive physical exam not completed today as office visit was conducted remotely.  Appears well over video and no evidence of acute respiratory distress.  Patient was alert and oriented, and appeared to have appropriate judgment.   No  results found for any visits on 08/16/23.    The 10-year ASCVD risk score (Arnett DK, et al., 2019) is: 3.5%    Assessment & Plan:   Problem List Items Addressed This Visit       Respiratory   Acute bacterial sinusitis - Primary   Acute Concern for secondary bacterial infection Per shared decision making will treat with course of doxycycline Patient to continue using mucinex and saline nasal spray as needed RTC if symptoms do not improve, she reports her understanding.       Relevant Medications   doxycycline (VIBRA-TABS) 100 MG tablet    Return if symptoms worsen or fail to improve.    Elenore Paddy, NP

## 2023-08-16 NOTE — Assessment & Plan Note (Signed)
 Acute Concern for secondary bacterial infection Per shared decision making will treat with course of doxycycline Patient to continue using mucinex and saline nasal spray as needed RTC if symptoms do not improve, she reports her understanding.

## 2023-08-23 ENCOUNTER — Other Ambulatory Visit: Payer: Self-pay | Admitting: Plastic Surgery

## 2023-08-23 ENCOUNTER — Ambulatory Visit (HOSPITAL_COMMUNITY): Payer: 59 | Attending: Cardiology

## 2023-08-23 DIAGNOSIS — I3139 Other pericardial effusion (noninflammatory): Secondary | ICD-10-CM | POA: Insufficient documentation

## 2023-08-23 LAB — ECHOCARDIOGRAM COMPLETE
Area-P 1/2: 3.84 cm2
S' Lateral: 2.2 cm

## 2023-08-27 LAB — DERMATOLOGY PATHOLOGY

## 2023-09-18 ENCOUNTER — Encounter: Payer: Self-pay | Admitting: Cardiology

## 2023-09-20 ENCOUNTER — Encounter: Payer: Self-pay | Admitting: Internal Medicine

## 2023-10-01 ENCOUNTER — Other Ambulatory Visit: Payer: Self-pay | Admitting: Obstetrics and Gynecology

## 2023-10-01 DIAGNOSIS — Z1231 Encounter for screening mammogram for malignant neoplasm of breast: Secondary | ICD-10-CM

## 2023-11-11 ENCOUNTER — Other Ambulatory Visit: Payer: Self-pay | Admitting: Internal Medicine

## 2023-11-14 ENCOUNTER — Other Ambulatory Visit (HOSPITAL_COMMUNITY): Payer: Self-pay

## 2023-11-14 MED ORDER — COVID-19 MRNA VAC-TRIS(PFIZER) 30 MCG/0.3ML IM SUSY
0.3000 mL | PREFILLED_SYRINGE | Freq: Once | INTRAMUSCULAR | 0 refills | Status: AC
  Filled 2023-11-14: qty 0.3, 1d supply, fill #0

## 2023-11-28 ENCOUNTER — Ambulatory Visit
Admission: RE | Admit: 2023-11-28 | Discharge: 2023-11-28 | Disposition: A | Discharge status: Home / Self Care | Source: Ambulatory Visit | Attending: Obstetrics and Gynecology | Admitting: Obstetrics and Gynecology

## 2023-11-28 DIAGNOSIS — Z1231 Encounter for screening mammogram for malignant neoplasm of breast: Secondary | ICD-10-CM

## 2023-12-02 ENCOUNTER — Other Ambulatory Visit (HOSPITAL_COMMUNITY): Payer: Self-pay | Admitting: Obstetrics and Gynecology

## 2023-12-02 DIAGNOSIS — Z803 Family history of malignant neoplasm of breast: Secondary | ICD-10-CM

## 2023-12-11 ENCOUNTER — Encounter: Payer: Self-pay | Admitting: Emergency Medicine

## 2023-12-11 ENCOUNTER — Telehealth: Admitting: Emergency Medicine

## 2023-12-11 ENCOUNTER — Other Ambulatory Visit (HOSPITAL_COMMUNITY): Payer: Self-pay

## 2023-12-11 ENCOUNTER — Ambulatory Visit (HOSPITAL_COMMUNITY)
Admission: RE | Admit: 2023-12-11 | Discharge: 2023-12-11 | Disposition: A | Discharge status: Home / Self Care | Source: Ambulatory Visit | Attending: Obstetrics and Gynecology | Admitting: Obstetrics and Gynecology

## 2023-12-11 DIAGNOSIS — J019 Acute sinusitis, unspecified: Secondary | ICD-10-CM | POA: Diagnosis not present

## 2023-12-11 DIAGNOSIS — Z803 Family history of malignant neoplasm of breast: Secondary | ICD-10-CM | POA: Insufficient documentation

## 2023-12-11 DIAGNOSIS — Z1239 Encounter for other screening for malignant neoplasm of breast: Secondary | ICD-10-CM | POA: Diagnosis present

## 2023-12-11 DIAGNOSIS — R0981 Nasal congestion: Secondary | ICD-10-CM

## 2023-12-11 DIAGNOSIS — B9689 Other specified bacterial agents as the cause of diseases classified elsewhere: Secondary | ICD-10-CM | POA: Diagnosis not present

## 2023-12-11 MED ORDER — GADOBUTROL 1 MMOL/ML IV SOLN
5.0000 mL | Freq: Once | INTRAVENOUS | Status: AC | PRN
  Administered 2023-12-11: 5 mL via INTRAVENOUS

## 2023-12-11 MED ORDER — PSEUDOEPHEDRINE-GUAIFENESIN ER 60-600 MG PO TB12
1.0000 | ORAL_TABLET | Freq: Two times a day (BID) | ORAL | 1 refills | Status: AC
  Filled 2023-12-11 (×2): qty 12, 6d supply, fill #0

## 2023-12-11 MED ORDER — AMOXICILLIN-POT CLAVULANATE 875-125 MG PO TABS
1.0000 | ORAL_TABLET | Freq: Two times a day (BID) | ORAL | 0 refills | Status: AC
Start: 2023-12-11 — End: 2023-12-18
  Filled 2023-12-11 (×2): qty 14, 7d supply, fill #0

## 2023-12-11 NOTE — Assessment & Plan Note (Signed)
 Acute viral infection now with secondary bacterial infection Recommend to start Augmentin  875 mg twice a day for 7 days Symptom management discussed. Recommend Mucinex  D and saline nasal spray Advised to rest and stay well-hydrated Advised to contact the office if no better or worse during the next several days.

## 2023-12-11 NOTE — Assessment & Plan Note (Signed)
 Recommend to use Mucinex  D every 12 hours along with nasal saline sprays frequently during the day Advised to rest and stay well-hydrated May take Tylenol  and/or Advil  for headaches as needed Advised to contact the office if no better or worse during the next several days.

## 2023-12-11 NOTE — Progress Notes (Signed)
 Telemedicine Encounter- SOAP NOTE Established Patient MyChart video encounter Patient: Home  Provider: Office   Patient present only  This video encounter was conducted with the patient's (or proxy's) verbal consent via video telecommunications: yes/no: Yes Patient was instructed to have this encounter in a suitably private space; and to only have persons present to whom they give permission to participate. In addition, patient identity was confirmed by use of name plus two identifiers (DOB and address).  Chief complaint: Sinus congestion and productive cough  Subjective  Katherine Vaughan is a 63 y.o. established patient.  Visit today complaining of flulike symptoms that started about 10 days ago progressively getting worse. Tested negative for flu and COVID at home.  Complaining of sinus congestion and productive cough.  It all started with a sore throat.  No fever. Able to eat and drink.  Denies nausea or vomiting.  Denies abdominal pain or diarrhea.  No other associated symptoms. No other complaints or medical concerns today.  HPI ? 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   Caregiver stress 01/24/2023   COVID-19 01/23/2023   Thrombocytopenia (HCC) 05/21/2022   Pain in left knee 01/24/2022   Arthritis 03/23/2020   Hiatal hernia 03/23/2020   Migraine 03/23/2020   Arthralgia 12/29/2018   Thumb pain, left 05/02/2017   Acne 08/01/2015   Family history of malignant neoplasm of breast 01/04/2014   Family history of malignant neoplasm of ovary 01/04/2014   Vitamin D  deficiency disease 07/04/2011   Osteoporosis    GERD (gastroesophageal reflux disease)    Past Medical History:  Diagnosis Date   Arthritis    GERD (gastroesophageal reflux disease)    on meds   History of colon polyps 2006   adenomatous, no polyps in 2011   History of hiatal  hernia    History of kidney stones    Low blood pressure    occ.   Migraines    decreased since menopause   Osteopenia    dexa 08/2010: -2.3 L fem   Osteoporosis    hx of -   PONV (postoperative nausea and vomiting)    only with Demerol   Skin cancer    Middle of chest   Urine incontinence    Current Outpatient Medications  Medication Sig Dispense Refill   amoxicillin -clavulanate (AUGMENTIN ) 875-125 MG tablet Take 1 tablet by mouth 2 (two) times daily for 7 days. 14 tablet 0   pseudoephedrine -guaifenesin  (MUCINEX  D) 60-600 MG 12 hr tablet Take 1 tablet by mouth every 12 (twelve) hours for 5 days. 12 tablet 1   acetaminophen  (TYLENOL ) 500 MG tablet Take 500 mg by mouth every 6 (six) hours as needed.     Ascorbic Acid (VITAMIN C) 1000 MG tablet Take 1,000 mg by mouth daily.     Calcium Citrate-Vitamin D  (CALCIUM CITRATE +) 315-5 MG-MCG TABS      Cholecalciferol (VITAMIN D3) 2000 UNITS TABS Take by mouth daily.       doxycycline  (VIBRA -TABS) 100 MG tablet Take 1 tablet (100 mg total) by mouth 2 (two) times daily. 20 tablet 0   esomeprazole  (NEXIUM ) 40 MG capsule Take 1 capsule (40 mg total) by mouth daily. NEEDS APPOINTMENT FOR ADDITIONAL REFILLS 90 capsule 0   estradiol  (VIVELLE -DOT) 0.0375 MG/24HR Place 1 patch onto the skin 2 (two) times a week. 24 patch 4   FIBER SELECT  GUMMIES PO Take 2 tablets by mouth at bedtime.     FORTEO 600 MCG/2.4ML SOPN      ibuprofen  (ADVIL ) 400 MG tablet Take 1 tablet (400 mg total) by mouth every 4 (four) hours for pain. 30 tablet 0   Multiple Vitamin (MULTIVITAMIN) tablet Take 1 tablet by mouth daily.       Omega-3 Fatty Acids (FISH OIL ULTRA) 1400 MG CAPS Take 1 tablet by mouth daily.     tretinoin (RETIN-A) 0.05 % cream Apply topically at bedtime.      No current facility-administered medications for this visit.   Allergies  Allergen Reactions   Demerol    Percocet [Oxycodone-Acetaminophen ]    Sulfa Antibiotics    Social History    Socioeconomic History   Marital status: Married    Spouse name: Not on file   Number of children: 0   Years of education: Not on file   Highest education level: Associate degree: occupational, Scientist, product/process development, or vocational program  Occupational History   Occupation: nurse  Tobacco Use   Smoking status: Never   Smokeless tobacco: Never  Vaping Use   Vaping status: Never Used  Substance and Sexual Activity   Alcohol use: No   Drug use: No   Sexual activity: Yes    Partners: Male    Birth control/protection: Post-menopausal, Surgical    Comment: married  Other Topics Concern   Not on file  Social History Narrative   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[facility] -   Married, lives with spouse - no children   Social Drivers of Corporate investment banker Strain: Low Risk  (08/16/2023)   Overall Financial Resource Strain (CARDIA)    Difficulty of Paying Living Expenses: Not hard at all  Food Insecurity: No Food Insecurity (08/16/2023)   Hunger Vital Sign    Worried About Running Out of Food in the Last Year: Never true    Ran Out of Food in the Last Year: Never true  Transportation Needs: No Transportation Needs (08/16/2023)   PRAPARE - Administrator, Civil Service (Medical): No    Lack of Transportation (Non-Medical): No  Physical Activity: Sufficiently Active (08/16/2023)   Exercise Vital Sign    Days of Exercise per Week: 6 days    Minutes of Exercise per Session: 150+ min  Stress: No Stress Concern Present (08/16/2023)   [employer] of Occupational Health - Occupational Stress Questionnaire    Feeling of Stress : Only a little  Social Connections: Socially Integrated (08/16/2023)   Social Connection and Isolation Panel    Frequency of Communication with Friends and Family: More than three times a week    Frequency of Social Gatherings with Friends and Family: Twice a week    Attends Religious Services: More than 4 times per year    Active Member of [employer] or Organizations: Yes     Attends Banker Meetings: 1 to 4 times per year    Marital Status: Married  Catering manager Violence: Not on file   Review of Systems  Constitutional: Negative.  Negative for chills and fever.  HENT:  Positive for congestion and sore throat.   Respiratory:  Positive for cough and sputum production. Negative for shortness of breath.   Gastrointestinal:  Negative for abdominal pain, diarrhea, nausea and vomiting.  Genitourinary: Negative.  Negative for dysuria and hematuria.  Skin: Negative.  Negative for rash.  Neurological: Negative.  Negative for dizziness and headaches.  All other systems reviewed  and are negative.  Objective  Alert and oriented x 3 in no apparent respiratory distress Vitals as reported by the patient: There were no vitals filed for this visit. Problem List Items Addressed This Visit       Respiratory   Acute bacterial sinusitis - Primary   Acute viral infection now with secondary bacterial infection Recommend to start Augmentin  875 mg twice a day for 7 days Symptom management discussed. Recommend Mucinex  D and saline nasal spray Advised to rest and stay well-hydrated Advised to contact the office if no better or worse during the next several days.      Relevant Medications   amoxicillin -clavulanate (AUGMENTIN ) 875-125 MG tablet   pseudoephedrine -guaifenesin  (MUCINEX  D) 60-600 MG 12 hr tablet   Sinus congestion   Recommend to use Mucinex  D every 12 hours along with nasal saline sprays frequently during the day Advised to rest and stay well-hydrated May take Tylenol  and/or Advil  for headaches as needed Advised to contact the office if no better or worse during the next several days.      Relevant Medications   pseudoephedrine -guaifenesin  (MUCINEX  D) 60-600 MG 12 hr tablet     I discussed the assessment and treatment plan with the patient. The patient was provided an opportunity to ask questions and all were answered. The patient agreed  with the plan and demonstrated an understanding of the instructions.   The patient was advised to call back or seek an in-person evaluation if the symptoms worsen or if the condition fails to improve as anticipated.  I provided 30 minutes minutes of face-to-face time during this encounter including time preparing for this visit, review of most recent office visit notes, review of chronic medical conditions under management, review of all medications, diagnosis of bacterial sinusitis and need for antibiotics, symptom management, prognosis, documentation and need for follow-up if no better or worse during the next several days.  Dr. Emil Schaumann, MD [facility]

## 2023-12-12 ENCOUNTER — Encounter: Payer: Self-pay | Admitting: "Endocrinology

## 2023-12-12 ENCOUNTER — Ambulatory Visit: Admitting: "Endocrinology

## 2023-12-12 ENCOUNTER — Other Ambulatory Visit (HOSPITAL_COMMUNITY): Payer: Self-pay

## 2023-12-12 VITALS — BP 102/70 | HR 68 | Ht 63.5 in | Wt 107.4 lb

## 2023-12-12 DIAGNOSIS — E209 Hypoparathyroidism, unspecified: Secondary | ICD-10-CM

## 2023-12-12 NOTE — Progress Notes (Signed)
 Endocrinology Consult Note                                            12/12/2023, 3:00 PM   Subjective:    Patient ID: Katherine Vaughan, female    DOB: 28-Apr-1961, PCP Geofm Glade PARAS, MD   Past Medical History:  Diagnosis Date   Arthritis    GERD (gastroesophageal reflux disease)    on meds   History of colon polyps 2006   adenomatous, no polyps in 2011   History of hiatal hernia    History of kidney stones    Low blood pressure    occ.   Migraines    decreased since menopause   Osteopenia    dexa 08/2010: -2.3 L fem   Osteoporosis    hx of -   PONV (postoperative nausea and vomiting)    only with Demerol   Skin cancer    Middle of chest   Urine incontinence    Past Surgical History:  Procedure Laterality Date   BREAST EXCISIONAL BIOPSY Bilateral    BREAST SURGERY     breast biopsy x's 2 (L) 92 and (R) 98   COLONOSCOPY  2016   at [facility] polyps/polyps/small hems   COLONOSCOPY WITH PROPOFOL  N/A 02/11/2015   Procedure: COLONOSCOPY WITH PROPOFOL ;  Surgeon: Oliva Boots, MD;  Location: [facility] ENDOSCOPY;  Service: Endoscopy;  Laterality: N/A;   ESOPHAGOGASTRODUODENOSCOPY N/A 02/11/2015   Procedure: ESOPHAGOGASTRODUODENOSCOPY (EGD);  Surgeon: Oliva Boots, MD;  Location: [facility] ENDOSCOPY;  Service: Endoscopy;  Laterality: N/A;   Laser endometriosis     SEPTOPLASTY     SHOULDER ARTHROSCOPY WITH BICEPS TENDON REPAIR Left 10/14/2018   Procedure: SHOULDER ARTHROSCOPY BICEP TENODESIS, SHOULDER DECOMPRESSION  AND SHOULDER MANIPULATION;  Surgeon: Beverley Evalene BIRCH, MD;  Location: [facility];  Service: Orthopedics;  Laterality: Left;  REGIONAL BLOCK   TUBAL LIGATION     VAGINAL HYSTERECTOMY  2006   Social History   Socioeconomic History   Marital status: Married    Spouse name: Not on file   Number of children: 0   Years of education: Not on file   Highest education level: Associate degree: occupational, Scientist, product/process development, or vocational program  Occupational History    Occupation: nurse  Tobacco Use   Smoking status: Never   Smokeless tobacco: Never  Vaping Use   Vaping status: Never Used  Substance and Sexual Activity   Alcohol use: No   Drug use: No   Sexual activity: Yes    Partners: Male    Birth control/protection: Post-menopausal, Surgical    Comment: married  Other Topics Concern   Not on file  Social History Narrative   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[facility] -   Married, lives with spouse - no children   Social Drivers of Corporate investment banker Strain: Low Risk  (08/16/2023)   Overall Financial Resource Strain (CARDIA)    Difficulty of Paying Living Expenses: Not hard at all  Food Insecurity: No Food Insecurity (08/16/2023)   Hunger Vital Sign    Worried About Running Out of Food in the Last Year: Never true    Ran Out of Food in the Last Year: Never true  Transportation Needs: No Transportation Needs (08/16/2023)   PRAPARE - Transportation    Lack of Transportation (Medical): No    Lack of  Transportation (Non-Medical): No  Physical Activity: Sufficiently Active (08/16/2023)   Exercise Vital Sign    Days of Exercise per Week: 6 days    Minutes of Exercise per Session: 150+ min  Stress: No Stress Concern Present (08/16/2023)   [employer] of Occupational Health - Occupational Stress Questionnaire    Feeling of Stress : Only a little  Social Connections: Socially Integrated (08/16/2023)   Social Connection and Isolation Panel    Frequency of Communication with Friends and Family: More than three times a week    Frequency of Social Gatherings with Friends and Family: Twice a week    Attends Religious Services: More than 4 times per year    Active Member of Clubs or Organizations: Yes    Attends Banker Meetings: 1 to 4 times per year    Marital Status: Married   Family History  Problem Relation Age of Onset   Arthritis Mother    Diverticulitis Mother    Colon polyps Mother    Arthritis Father    COPD Father     Atrial fibrillation Father    Heart disease Father    Colon polyps Father    Diverticulitis Father    Stroke Other    Breast cancer Sister 53   Kidney cancer Sister 80   Colon polyps Sister    Diverticulitis Sister 61       s/p colectomy   Colon polyps Sister    Diverticulitis Brother 3   Colon polyps Brother    Pancreatic cancer Paternal Uncle 30   Stomach cancer Paternal Uncle 86   Ovarian cancer Other 70       mat great aunt through MGM with ovarian   Diabetes Maternal Grandfather    Heart disease Maternal Grandfather    Hyperlipidemia Maternal Grandfather    Colon cancer Neg Hx    Esophageal cancer Neg Hx    Rectal cancer Neg Hx    Outpatient Encounter Medications as of 12/12/2023  Medication Sig   acetaminophen  (TYLENOL ) 500 MG tablet Take 500 mg by mouth every 6 (six) hours as needed.   amoxicillin -clavulanate (AUGMENTIN ) 875-125 MG tablet Take 1 tablet by mouth 2 (two) times daily for 7 days.   Ascorbic Acid (VITAMIN C) 1000 MG tablet Take 1,000 mg by mouth daily.   Calcium Citrate-Vitamin D  (CALCIUM CITRATE +) 315-5 MG-MCG TABS    Cholecalciferol (VITAMIN D3) 2000 UNITS TABS Take by mouth daily.     doxycycline  (VIBRA -TABS) 100 MG tablet Take 1 tablet (100 mg total) by mouth 2 (two) times daily.   esomeprazole  (NEXIUM ) 40 MG capsule Take 1 capsule (40 mg total) by mouth daily. NEEDS APPOINTMENT FOR ADDITIONAL REFILLS   estradiol  (VIVELLE -DOT) 0.0375 MG/24HR Place 1 patch onto the skin 2 (two) times a week.   FIBER SELECT GUMMIES PO Take 2 tablets by mouth at bedtime.   FORTEO 600 MCG/2.4ML SOPN    ibuprofen  (ADVIL ) 400 MG tablet Take 1 tablet (400 mg total) by mouth every 4 (four) hours for pain.   Multiple Vitamin (MULTIVITAMIN) tablet Take 1 tablet by mouth daily.     Omega-3 Fatty Acids (FISH OIL ULTRA) 1400 MG CAPS Take 1 tablet by mouth daily.   pseudoephedrine -guaifenesin  (MUCINEX  D) 60-600 MG 12 hr tablet Take 1 tablet by mouth every 12 (twelve) hours for 5 days.    tretinoin (RETIN-A) 0.05 % cream Apply topically at bedtime.    No facility-administered encounter medications on file as of 12/12/2023.   ALLERGIES:  Allergies  Allergen Reactions   Demerol    Percocet [Oxycodone-Acetaminophen ]    Sulfa Antibiotics     VACCINATION STATUS: Immunization History  Administered Date(s) Administered   Influenza Split 03/05/2011   Influenza Whole 03/04/2012   Influenza,inj,Quad PF,6+ Mos 03/04/2013, 02/23/2014, 03/24/2015, 02/24/2021   Influenza-Unspecified 02/28/2016, 02/17/2017, 02/17/2018, 02/25/2020, 03/07/2020, 02/27/2022, 02/13/2023   Pfizer Covid-19 Vaccine Bivalent Booster 34yrs & up 03/17/2021   Pfizer(Comirnaty )Fall Seasonal Vaccine 12 years and older 04/19/2022, 05/01/2023, 11/14/2023   Respiratory Syncytial Virus Vaccine ,Recomb Aduvanted(Arexvy ) 06/20/2022   Tdap 01/06/2007, 02/25/2017   Unspecified SARS-COV-2 Vaccination 05/27/2019, 02/25/2020, 03/19/2020, 09/23/2020   Zoster Recombinant(Shingrix ) 11/08/2017, 01/15/2018   Zoster, Live 11/11/2017, 01/15/2018    HPI Latayna Ritchie is 63 y.o. female who presents today with a medical history as above. she is being seen in consultation for low PTH requested by Geofm Glade PARAS, MD.  History was obtained from the patient as well as her chart review.  She gives a medical history of osteoporosis since age 31 for which she was treated with Fosamax for several years before she switched to Forteo last year.  She tolerates this medication very well at 20 mcg subcutaneous daily injection.   Routine lab in May 2025 showed mildly decreased PTH of 13 (normal 15-65) without hypo or hypercalcemia. Interview with the patient did not reveal any prior history of neck surgery, radiation, nor infiltrative disease. She denies any familial or personal syndromic conditions against her parathyroid. She is on calcium and vitamin D  supplements as well as multivitamin with some magnesium  daily. Her labs did not include  magnesium  measurement, however did have normal renal function. She has 1 remote past history of wrist fracture from a fall while running, no recent history of fragility fractures ;  nor any height loss.  Always been light weight for most of her adult life.  She is on hormone replacement therapy with estrogen patch status post partial hysterectomy 1996 for endometriosis.  She reports thyroid  dysfunction in her mother who also has osteoporosis. Her other medications include esomeprazole , estradiol , vitamin C, calcium citrate, fish oil, multivitamin, vitamin D .  Parathyroid 20 mcg subcutaneously daily. She is not a smoker, does not use any alcohol.  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  Her maximum weight has been 125 pounds during her early 5s, currently 107 pounds.  Recently lost some weight mourning death of her father. No family history of hemochromatosis.  Review of Systems  Constitutional: +mildly fluctuating body weight , no fatigue, no subjective hyperthermia, no subjective hypothermia Eyes: no blurry vision, no xerophthalmia ENT: no sore throat, no nodules palpated in throat, no dysphagia/odynophagia, no hoarseness Cardiovascular: no Chest Pain, no Shortness of Breath, no palpitations, no leg swelling Respiratory: no cough, no shortness of breath Gastrointestinal: no Nausea/Vomiting/Diarhhea Musculoskeletal: no muscle/joint aches Skin: no rashes Neurological: no tremors, no numbness, no tingling, no dizziness Psychiatric: no depression, no anxiety  Objective:       12/12/2023    1:28 PM 08/16/2023   11:35 AM 07/30/2023    1:43 PM  Vitals with BMI  Height 5' 3.5  5' 3.6  Weight 107 lbs 6 oz  108 lbs 3 oz  BMI 18.72  18.82  Systolic 102 -- 111  Diastolic 70 -- 69  Pulse 68  74    BP 102/70   Pulse 68   Ht 5' 3.5 (1.613 m)   Wt 107 lb 6.4 oz (48.7 kg)   BMI 18.73 kg/m   Wt Readings  from Last 3 Encounters:  12/12/23 107 lb 6.4 oz (48.7 kg)  07/30/23 108 lb 3.2  oz (49.1 kg)  02/06/23 102 lb 6.4 oz (46.4 kg)    Physical Exam  Constitutional:  Body mass index is 18.73 kg/m.,  not in acute distress, normal state of mind Eyes: PERRLA, EOMI, no exophthalmos ENT: moist mucous membranes, no gross thyromegaly, no gross cervical lymphadenopathy Cardiovascular: normal precordial activity, Regular Rate and Rhythm, no Murmur/Rubs/Gallops Respiratory:  adequate breathing efforts, no gross chest deformity, Clear to auscultation bilaterally Gastrointestinal: abdomen soft, Non -tender, No distension, Bowel Sounds present, no gross organomegaly Musculoskeletal: no gross deformities, strength intact in all four extremities, no peripheral edema Skin: moist, warm, no rashes Neurological: no tremor with outstretched hands, Deep tendon reflexes normal in bilateral lower extremities.  CMP ( most recent) CMP     Component Value Date/Time   NA 139 01/01/2023 0832   NA 142 03/31/2010 0000   K 3.7 01/01/2023 0832   CL 102 01/01/2023 0832   CO2 30 01/01/2023 0832   GLUCOSE 88 01/01/2023 0832   BUN 17 01/01/2023 0832   BUN 16 03/31/2010 0000   CREATININE 0.68 01/01/2023 0832   CREATININE 0.69 12/14/2019 0830   CALCIUM 10.1 01/01/2023 0832   PROT 6.3 01/01/2023 0832   ALBUMIN 4.2 01/01/2023 0832   AST 24 01/01/2023 0832   ALT 23 01/01/2023 0832   ALKPHOS 63 01/01/2023 0832   BILITOT 0.8 01/01/2023 0832   GFR 92.20 01/01/2023 0832    Diabetic Labs (most recent): Lab Results  Component Value Date   HGBA1C 5.5 11/13/2022     Lipid Panel ( most recent) Lipid Panel     Component Value Date/Time   CHOL 184 01/01/2023 0832   TRIG 49.0 01/01/2023 0832   HDL 86.70 01/01/2023 0832   CHOLHDL 2 01/01/2023 0832   VLDL 9.8 01/01/2023 0832   LDLCALC 87 01/01/2023 0832   LDLCALC 83 12/14/2019 0830      Lab Results  Component Value Date   TSH 1.49 01/01/2023   TSH 1.35 12/27/2021   TSH 1.61 12/26/2020   TSH 1.70 12/14/2019   TSH 1.27 12/08/2018     08/10/2023 labs: Calcium 9.7, PTH 13 (normal 50-65), vitamin D47.1    Assessment & Plan:   1. Hypoparathyroidism, unspecified hypoparathyroidism   - Jeanita Carneiro  is being seen at a kind request of Burns, Glade PARAS, MD. - I have reviewed her available  records and clinically evaluated the patient.  - Based on these reviews, she has low PTH of 13,  however,  there is not sufficient information to proceed with definitive treatment plan. Her history does not lead to any specific etiology for hyperparathyroidism.  - she will need a repeat,  more complete labs towards confirming the diagnosis.  She will be sent to lab today to get - Magnesium  - PTH, intact and calcium - Phosphorus - Ferritin - TSH / T4, free / T3 - Comprehensive metabolic panel with GFR She will return in 1 week to discuss her labs and treatment decisions as needed. She is encouraged to continue Forteo 20 mcg subcutaneously daily based on her current arrangements.  She reports that she has completed first year of treatment and planned for another year for a total of 2 years of treatment.  Her osteoporosis treatment as well as DEXA imaging are handled by her OB/GYN provider.   -She is encouraged to stay active, advised on proper protein intake. - I did  not initiate any new prescriptions today. - she is advised to maintain close follow up with Geofm, Glade PARAS, MD for primary care needs.   -Thank you for involving me in the care of this pleasant patient.  Time spent with the patient: 45  minutes spent in  counseling her about low PTH and the rest in obtaining information about her symptoms, reviewing her previous labs/studies (including abstractions from other facilities),  evaluations, and treatments,  and developing a plan to confirm diagnosis and long term treatment based on the latest standards of care/guidelines; and documenting her care.  Katherine Vaughan participated in the discussions, expressed understanding, and  voiced agreement with the above plans.  All questions were answered to her satisfaction. she is encouraged to contact clinic should she have any questions or concerns prior to her return visit.  Follow up plan: Return in about 1 week (around 12/19/2023) for Labs Today- Non-Fasting Ok.   Ranny Earl, MD [facility] [address] [place], [place] [address] Phone: [phone]  Fax: [phone]     12/12/2023, 3:00 PM  This note was partially dictated with voice recognition software. Similar sounding words can be transcribed inadequately or may not  be corrected upon review.

## 2023-12-14 LAB — PTH, INTACT AND CALCIUM: PTH: 17 pg/mL (ref 15–65)

## 2023-12-14 LAB — FERRITIN: Ferritin: 100 ng/mL (ref 15–150)

## 2023-12-14 LAB — COMPREHENSIVE METABOLIC PANEL WITH GFR
ALT: 20 IU/L (ref 0–32)
AST: 23 IU/L (ref 0–40)
Albumin: 4.4 g/dL (ref 3.9–4.9)
Alkaline Phosphatase: 101 IU/L (ref 44–121)
BUN/Creatinine Ratio: 27 (ref 12–28)
BUN: 17 mg/dL (ref 8–27)
Bilirubin Total: 0.7 mg/dL (ref 0.0–1.2)
CO2: 25 mmol/L (ref 20–29)
Calcium: 10 mg/dL (ref 8.7–10.3)
Chloride: 99 mmol/L (ref 96–106)
Creatinine, Ser: 0.62 mg/dL (ref 0.57–1.00)
Globulin, Total: 2.2 g/dL (ref 1.5–4.5)
Glucose: 78 mg/dL (ref 70–99)
Potassium: 4.9 mmol/L (ref 3.5–5.2)
Sodium: 139 mmol/L (ref 134–144)
Total Protein: 6.6 g/dL (ref 6.0–8.5)
eGFR: 99 mL/min/1.73 (ref >59)

## 2023-12-14 LAB — PHOSPHORUS: Phosphorus: 4 mg/dL (ref 3.0–4.3)

## 2023-12-14 LAB — MAGNESIUM: Magnesium: 1.9 mg/dL (ref 1.6–2.3)

## 2023-12-14 LAB — T3: T3, Total: 115 ng/dL (ref 71–180)

## 2023-12-14 LAB — TSH: TSH: 1.76 u[IU]/mL (ref 0.450–4.500)

## 2023-12-14 LAB — T4, FREE: Free T4: 1.25 ng/dL (ref 0.82–1.77)

## 2023-12-19 ENCOUNTER — Ambulatory Visit: Admitting: "Endocrinology

## 2023-12-19 ENCOUNTER — Encounter: Payer: Self-pay | Admitting: "Endocrinology

## 2023-12-19 VITALS — BP 98/60 | HR 64 | Ht 63.5 in | Wt 108.4 lb

## 2023-12-19 DIAGNOSIS — M810 Age-related osteoporosis without current pathological fracture: Secondary | ICD-10-CM | POA: Diagnosis not present

## 2023-12-19 DIAGNOSIS — E20819 Hypoparathyroidism due to impaired parathyroid hormone secretion, unspecified: Secondary | ICD-10-CM | POA: Insufficient documentation

## 2023-12-19 DIAGNOSIS — E209 Hypoparathyroidism, unspecified: Secondary | ICD-10-CM | POA: Diagnosis not present

## 2023-12-19 NOTE — Progress Notes (Signed)
 12/19/2023, 1:44 PM   Endocrinology follow-up note  Subjective:    Patient ID: Katherine Vaughan, female    DOB: 02-22-1961, PCP Geofm Glade PARAS, MD   Past Medical History:  Diagnosis Date   Arthritis    GERD (gastroesophageal reflux disease)    on meds   History of colon polyps 2006   adenomatous, no polyps in 2011   History of hiatal hernia    History of kidney stones    Low blood pressure    occ.   Migraines    decreased since menopause   Osteopenia    dexa 08/2010: -2.3 L fem   Osteoporosis    hx of -   PONV (postoperative nausea and vomiting)    only with Demerol   Skin cancer    Middle of chest   Urine incontinence    Past Surgical History:  Procedure Laterality Date   BREAST EXCISIONAL BIOPSY Bilateral    BREAST SURGERY     breast biopsy x's 2 (L) 92 and (R) 98   COLONOSCOPY  2016   at [facility] polyps/polyps/small hems   COLONOSCOPY WITH PROPOFOL  N/A 02/11/2015   Procedure: COLONOSCOPY WITH PROPOFOL ;  Surgeon: Oliva Boots, MD;  Location: [facility] ENDOSCOPY;  Service: Endoscopy;  Laterality: N/A;   ESOPHAGOGASTRODUODENOSCOPY N/A 02/11/2015   Procedure: ESOPHAGOGASTRODUODENOSCOPY (EGD);  Surgeon: Oliva Boots, MD;  Location: [facility] ENDOSCOPY;  Service: Endoscopy;  Laterality: N/A;   Laser endometriosis     SEPTOPLASTY     SHOULDER ARTHROSCOPY WITH BICEPS TENDON REPAIR Left 10/14/2018   Procedure: SHOULDER ARTHROSCOPY BICEP TENODESIS, SHOULDER DECOMPRESSION  AND SHOULDER MANIPULATION;  Surgeon: Beverley Evalene BIRCH, MD;  Location: [facility];  Service: Orthopedics;  Laterality: Left;  REGIONAL BLOCK   TUBAL LIGATION     VAGINAL HYSTERECTOMY  2006   Social History   Socioeconomic History   Marital status: Married    Spouse name: Not on file   Number of children: 0   Years of education: Not on file   Highest education level: Associate degree: occupational, Scientist, product/process development, or vocational program  Occupational  History   Occupation: nurse  Tobacco Use   Smoking status: Never   Smokeless tobacco: Never  Vaping Use   Vaping status: Never Used  Substance and Sexual Activity   Alcohol use: No   Drug use: No   Sexual activity: Yes    Partners: Male    Birth control/protection: Post-menopausal, Surgical    Comment: married  Other Topics Concern   Not on file  Social History Narrative   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[facility] -   Married, lives with spouse - no children   Social Drivers of Corporate investment banker Strain: Low Risk  (08/16/2023)   Overall Financial Resource Strain (CARDIA)    Difficulty of Paying Living Expenses: Not hard at all  Food Insecurity: No Food Insecurity (08/16/2023)   Hunger Vital Sign    Worried About Running Out of Food in the Last Year: Never true    Ran Out of Food in the Last Year: Never true  Transportation Needs: No Transportation Needs (08/16/2023)   PRAPARE - Administrator, Civil Service (Medical): No  Lack of Transportation (Non-Medical): No  Physical Activity: Sufficiently Active (08/16/2023)   Exercise Vital Sign    Days of Exercise per Week: 6 days    Minutes of Exercise per Session: 150+ min  Stress: No Stress Concern Present (08/16/2023)   [employer] of Occupational Health - Occupational Stress Questionnaire    Feeling of Stress : Only a little  Social Connections: Socially Integrated (08/16/2023)   Social Connection and Isolation Panel    Frequency of Communication with Friends and Family: More than three times a week    Frequency of Social Gatherings with Friends and Family: Twice a week    Attends Religious Services: More than 4 times per year    Active Member of Clubs or Organizations: Yes    Attends Banker Meetings: 1 to 4 times per year    Marital Status: Married   Family History  Problem Relation Age of Onset   Arthritis Mother    Diverticulitis Mother    Colon polyps Mother    Arthritis Father    COPD Father     Atrial fibrillation Father    Heart disease Father    Colon polyps Father    Diverticulitis Father    Stroke Other    Breast cancer Sister 24   Kidney cancer Sister 43   Colon polyps Sister    Diverticulitis Sister 72       s/p colectomy   Colon polyps Sister    Diverticulitis Brother 77   Colon polyps Brother    Pancreatic cancer Paternal Uncle 75   Stomach cancer Paternal Uncle 58   Ovarian cancer Other 70       mat great aunt through MGM with ovarian   Diabetes Maternal Grandfather    Heart disease Maternal Grandfather    Hyperlipidemia Maternal Grandfather    Colon cancer Neg Hx    Esophageal cancer Neg Hx    Rectal cancer Neg Hx    Outpatient Encounter Medications as of 12/19/2023  Medication Sig   acetaminophen  (TYLENOL ) 500 MG tablet Take 500 mg by mouth every 6 (six) hours as needed.   Ascorbic Acid (VITAMIN C) 1000 MG tablet Take 1,000 mg by mouth daily.   Calcium Citrate-Vitamin D  (CALCIUM CITRATE +) 315-5 MG-MCG TABS    Cholecalciferol (VITAMIN D3) 2000 UNITS TABS Take by mouth daily.     doxycycline  (VIBRA -TABS) 100 MG tablet Take 1 tablet (100 mg total) by mouth 2 (two) times daily. (Patient not taking: Reported on 12/19/2023)   esomeprazole  (NEXIUM ) 40 MG capsule Take 1 capsule (40 mg total) by mouth daily. NEEDS APPOINTMENT FOR ADDITIONAL REFILLS   estradiol  (VIVELLE -DOT) 0.0375 MG/24HR Place 1 patch onto the skin 2 (two) times a week.   FIBER SELECT GUMMIES PO Take 2 tablets by mouth at bedtime.   FORTEO 600 MCG/2.4ML SOPN    ibuprofen  (ADVIL ) 400 MG tablet Take 1 tablet (400 mg total) by mouth every 4 (four) hours for pain.   Multiple Vitamin (MULTIVITAMIN) tablet Take 1 tablet by mouth daily.     Omega-3 Fatty Acids (FISH OIL ULTRA) 1400 MG CAPS Take 1 tablet by mouth daily.   tretinoin (RETIN-A) 0.05 % cream Apply topically at bedtime.    No facility-administered encounter medications on file as of 12/19/2023.   ALLERGIES: Allergies  Allergen Reactions    Demerol    Percocet [Oxycodone-Acetaminophen ]    Sulfa Antibiotics     VACCINATION STATUS: Immunization History  Administered Date(s) Administered   Influenza Split  03/05/2011   Influenza Whole 03/04/2012   Influenza,inj,Quad PF,6+ Mos 03/04/2013, 02/23/2014, 03/24/2015, 02/24/2021   Influenza-Unspecified 02/28/2016, 02/17/2017, 02/17/2018, 02/25/2020, 03/07/2020, 02/27/2022, 02/13/2023   Pfizer Covid-19 Vaccine Bivalent Booster 86yrs & up 03/17/2021   Pfizer(Comirnaty )Fall Seasonal Vaccine 12 years and older 04/19/2022, 05/01/2023, 11/14/2023   Respiratory Syncytial Virus Vaccine ,Recomb Aduvanted(Arexvy ) 06/20/2022   Tdap 01/06/2007, 02/25/2017   Unspecified SARS-COV-2 Vaccination 05/27/2019, 02/25/2020, 03/19/2020, 09/23/2020   Zoster Recombinant(Shingrix ) 11/08/2017, 01/15/2018   Zoster, Live 11/11/2017, 01/15/2018    HPI Katherine Vaughan is 63 y.o. female who presents today with a medical history as above. she is being seen in follow-up after she was seen in consultation for low PTH requested by Geofm Glade PARAS, MD.  She returns to discuss the labs ordered during her first visit.  She gives a medical history of osteoporosis since age 5 for which she was treated with Fosamax for several years before she switched to Forteo last year.  She tolerates this medication very well at 20 mcg subcutaneous daily injection.   Routine lab in May 2025 showed mildly decreased PTH of 13 (normal 15-65) without hypo or hypercalcemia.  Her repeat labs showed normalization of PTH 217 along with normocalcemia, normal serum magnesium , renal function. Interview with the patient did not reveal any prior history of neck surgery, radiation, nor infiltrative disease. She denies any familial or personal syndromic conditions against her parathyroid. She is on calcium and vitamin D  supplements as well as multivitamin with some magnesium  daily. She is on ongoing treatment with Forteo for osteoporosis, completed  1 year of treatment so far. She has 1 remote past history of wrist fracture from a fall while running, no recent history of fragility fractures ;  nor any height loss.  Always been light weight for most of her adult life.  She is on hormone replacement therapy with estrogen patch status post partial hysterectomy 1996 for endometriosis.  She reports thyroid  dysfunction in her mother who also has osteoporosis. Her other medications include esomeprazole , estradiol , vitamin C, calcium citrate, fish oil, multivitamin, vitamin D .  Forteo 20 mcg subcutaneously daily. She is not a smoker, does not use any alcohol.  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  Her maximum weight has been 125 pounds during her early 70s, currently 108 pounds.  Recently lost some weight mourning the death of her father. No family history of hemochromatosis.  Review of Systems  Constitutional: +mildly fluctuating body weight , no fatigue, no subjective hyperthermia, no subjective hypothermia Eyes: no blurry vision, no xerophthalmia ENT: no sore throat, no nodules palpated in throat, no dysphagia/odynophagia, no hoarseness Cardiovascular: no Chest Pain, no Shortness of Breath, no palpitations, no leg swelling Respiratory: no cough, no shortness of breath Gastrointestinal: no Nausea/Vomiting/Diarhhea Musculoskeletal: no muscle/joint aches Skin: no rashes Neurological: no tremors, no numbness, no tingling, no dizziness Psychiatric: no depression, no anxiety  Objective:       12/19/2023   11:12 AM 12/12/2023    1:28 PM 08/16/2023   11:35 AM  Vitals with BMI  Height 5' 3.5 5' 3.5   Weight 108 lbs 6 oz 107 lbs 6 oz   BMI 18.9 18.72   Systolic 98 102 --  Diastolic 60 70 --  Pulse 64 68     BP 98/60   Pulse 64   Ht 5' 3.5 (1.613 m)   Wt 108 lb 6.4 oz (49.2 kg)   BMI 18.90 kg/m   Wt Readings from Last 3 Encounters:  12/19/23 108 lb  6.4 oz (49.2 kg)  12/12/23 107 lb 6.4 oz (48.7 kg)  07/30/23 108 lb 3.2 oz  (49.1 kg)    Physical Exam  Constitutional:  Body mass index is 18.9 kg/m.,  not in acute distress, normal state of mind Eyes: PERRLA, EOMI, no exophthalmos ENT: moist mucous membranes, no gross thyromegaly, no gross cervical lymphadenopathy Cardiovascular: normal precordial activity, Regular Rate and Rhythm, no Murmur/Rubs/Gallops Respiratory:  adequate breathing efforts, no gross chest deformity, Clear to auscultation bilaterally Gastrointestinal: abdomen soft, Non -tender, No distension, Bowel Sounds present, no gross organomegaly Musculoskeletal: no gross deformities, strength intact in all four extremities, no peripheral edema Skin: moist, warm, no rashes Neurological: no tremor with outstretched hands, Deep tendon reflexes normal in bilateral lower extremities.   Recent Results (from the past 2160 hours)  Magnesium      Status: None   Collection Time: 12/12/23  2:23 PM  Result Value Ref Range   Magnesium  1.9 1.6 - 2.3 mg/dL  PTH, intact and calcium     Status: None   Collection Time: 12/12/23  2:23 PM  Result Value Ref Range   PTH 17 15 - 65 pg/mL   PTH Interp Comment     Comment: Interpretation                 Intact PTH    Calcium                                 (pg/mL)      (mg/dL) Normal                          15 - 65     8.6 - 10.2 Primary Hyperparathyroidism         >65          >10.2 Secondary Hyperparathyroidism       >65          <10.2 Non-Parathyroid Hypercalcemia       <65          >10.2 Hypoparathyroidism                  <15          < 8.6 Non-Parathyroid Hypocalcemia    15 - 65          < 8.6   Phosphorus     Status: None   Collection Time: 12/12/23  2:23 PM  Result Value Ref Range   Phosphorus 4.0 3.0 - 4.3 mg/dL  Ferritin     Status: None   Collection Time: 12/12/23  2:23 PM  Result Value Ref Range   Ferritin 100 15 - 150 ng/mL  TSH     Status: None   Collection Time: 12/12/23  2:23 PM  Result Value Ref Range   TSH 1.760 0.450 - 4.500 uIU/mL  T4,  free     Status: None   Collection Time: 12/12/23  2:23 PM  Result Value Ref Range   Free T4 1.25 0.82 - 1.77 ng/dL  T3     Status: None   Collection Time: 12/12/23  2:23 PM  Result Value Ref Range   T3, Total 115 71 - 180 ng/dL  Comprehensive metabolic panel with GFR     Status: None   Collection Time: 12/12/23  2:23 PM  Result Value Ref Range   Glucose 78 70 - 99 mg/dL   BUN  17 8 - 27 mg/dL   Creatinine, Ser 9.37 0.57 - 1.00 mg/dL   eGFR 99 >40 fO/fpw/8.26   BUN/Creatinine Ratio 27 12 - 28   Sodium 139 134 - 144 mmol/L   Potassium 4.9 3.5 - 5.2 mmol/L   Chloride 99 96 - 106 mmol/L   CO2 25 20 - 29 mmol/L   Calcium 10.0 8.7 - 10.3 mg/dL   Total Protein 6.6 6.0 - 8.5 g/dL   Albumin 4.4 3.9 - 4.9 g/dL   Globulin, Total 2.2 1.5 - 4.5 g/dL   Bilirubin Total 0.7 0.0 - 1.2 mg/dL   Alkaline Phosphatase 101 44 - 121 IU/L   AST 23 0 - 40 IU/L   ALT 20 0 - 32 IU/L     Diabetic Labs (most recent): Lab Results  Component Value Date   HGBA1C 5.5 11/13/2022     Lipid Panel ( most recent) Lipid Panel     Component Value Date/Time   CHOL 184 01/01/2023 0832   TRIG 49.0 01/01/2023 0832   HDL 86.70 01/01/2023 0832   CHOLHDL 2 01/01/2023 0832   VLDL 9.8 01/01/2023 0832   LDLCALC 87 01/01/2023 0832   LDLCALC 83 12/14/2019 0830      Lab Results  Component Value Date   TSH 1.760 12/12/2023   TSH 1.49 01/01/2023   TSH 1.35 12/27/2021   TSH 1.61 12/26/2020   TSH 1.70 12/14/2019   FREET4 1.25 12/12/2023    08/10/2023 labs: Calcium 9.7, PTH 13 (normal 50-65), vitamin D47.1    Assessment & Plan:   1. Hypoparathyroidism with normocalcemia-resolved   - Katherine Vaughan  is being seen at a kind request of Burns, Glade PARAS, MD. - I have reviewed her  new and available  records and clinically evaluated the patient.  - Based on these reviews, she has now normal calcium, PTH, magnesium .  She does not have a confirmed diagnosis of hypoparathyroidism.  Her history does not lead to any  syndromic parathyroid dysfunctions. She has normal electrolytes, renal function, liver function, thyroid /parathyroid function for this time. -She would not need any intervention from the point of view of calcium homeostasis.  She is encouraged to continue Forteo 20 mcg subcutaneously daily based on her current arrangements.  She reports that she has completed first year of treatment and planned for another year for a total of 2 years of treatment.  Her osteoporosis treatment as well as DEXA imaging are handled by her OB/GYN provider.   -She is encouraged to stay active, advised on proper protein intake. - I did not initiate any new prescriptions today. - she is advised to maintain close follow up with Geofm Glade PARAS, MD for primary care needs.   I spent  20  minutes in the care of the patient today including review of labs from Thyroid  Function, CMP, and other relevant labs ; imaging/biopsy records (current and previous including abstractions from other facilities); face-to-face time discussing  her lab results and symptoms, medications doses, her options of short and long term treatment based on the latest standards of care / guidelines;   and documenting the encounter.  Katherine Vaughan  participated in the discussions, expressed understanding, and voiced agreement with the above plans.  All questions were answered to her satisfaction. she is encouraged to contact clinic should she have any questions or concerns prior to her return visit.   Follow up plan: Return if symptoms worsen or fail to improve.   Ranny Earl, MD [facility] [address] [place], [place] [address] Phone: [phone]  Fax: [phone]     12/19/2023, 1:44 PM  This note was partially dictated with voice recognition software. Similar sounding words can be transcribed inadequately or may not  be corrected upon review.

## 2024-01-01 ENCOUNTER — Encounter: Payer: Self-pay | Admitting: Internal Medicine

## 2024-01-01 NOTE — Progress Notes (Unsigned)
 Subjective:    Patient ID: Katherine Vaughan, female    DOB: Oct 08, 1960, 63 y.o.   MRN: [id number]      HPI Channel is here for a Physical exam and her chronic medical problems.    Her Dad passed away last 01/10/2024.  Her mom has multiple medical problems.     Medications and allergies reviewed with patient and updated if appropriate.  Current Outpatient Medications on File Prior to Visit  Medication Sig Dispense Refill   acetaminophen  (TYLENOL ) 500 MG tablet Take 500 mg by mouth every 6 (six) hours as needed.     Ascorbic Acid (VITAMIN C) 1000 MG tablet Take 1,000 mg by mouth daily.     Calcium Citrate-Vitamin D  (CALCIUM CITRATE +) 315-5 MG-MCG TABS      Cholecalciferol (VITAMIN D3) 2000 UNITS TABS Take by mouth daily.       esomeprazole  (NEXIUM ) 40 MG capsule Take 1 capsule (40 mg total) by mouth daily. NEEDS APPOINTMENT FOR ADDITIONAL REFILLS 90 capsule 0   estradiol  (VIVELLE -DOT) 0.0375 MG/24HR Place 1 patch onto the skin 2 (two) times a week. 24 patch 4   FIBER SELECT GUMMIES PO Take 2 tablets by mouth at bedtime.     FORTEO 600 MCG/2.4ML SOPN      ibuprofen  (ADVIL ) 400 MG tablet Take 1 tablet (400 mg total) by mouth every 4 (four) hours for pain. 30 tablet 0   Multiple Vitamin (MULTIVITAMIN) tablet Take 1 tablet by mouth daily.       Omega-3 Fatty Acids (FISH OIL ULTRA) 1400 MG CAPS Take 1 tablet by mouth daily.     tretinoin (RETIN-A) 0.05 % cream Apply topically at bedtime.      No current facility-administered medications on file prior to visit.    Review of Systems  Constitutional:  Negative for fever.  Eyes:  Negative for visual disturbance.  Respiratory:  Negative for cough, shortness of breath and wheezing.   Cardiovascular:  Negative for chest pain, palpitations and leg swelling.  Gastrointestinal:  Positive for constipation (controlled). Negative for abdominal pain, blood in stool and diarrhea.       Gerd controlled  Genitourinary:  Negative for dysuria.   Musculoskeletal:  Positive for back pain. Negative for arthralgias.  Skin:  Negative for rash.  Neurological:  Negative for light-headedness and headaches.  Psychiatric/Behavioral:  Negative for dysphoric mood. The patient is not nervous/anxious.        Objective:   Vitals:   01/02/24 0859  BP: 100/78  Pulse: 70  Temp: 97.6 F (36.4 C)  SpO2: 100%   Filed Weights   01/02/24 0859  Weight: 107 lb (48.5 kg)   Body mass index is 18.66 kg/m.  BP Readings from Last 3 Encounters:  01/02/24 100/78  12/19/23 98/60  12/12/23 102/70    Wt Readings from Last 3 Encounters:  01/02/24 107 lb (48.5 kg)  12/19/23 108 lb 6.4 oz (49.2 kg)  12/12/23 107 lb 6.4 oz (48.7 kg)       Physical Exam Constitutional: She appears well-developed and well-nourished. No distress.  HENT:  Head: Normocephalic and atraumatic.  Right Ear: External ear normal. Normal ear canal and TM Left Ear: External ear normal.  Normal ear canal and TM Mouth/Throat: Oropharynx is clear and moist.  Eyes: Conjunctivae normal.  Neck: Neck supple. No tracheal deviation present. No thyromegaly present.  No carotid bruit  Cardiovascular: Normal rate, regular rhythm and normal heart sounds.   No murmur heard.  No edema.  Pulmonary/Chest: Effort normal and breath sounds normal. No respiratory distress. She has no wheezes. She has no rales.  Breast: deferred   Abdominal: Soft. She exhibits no distension. There is no tenderness.  Lymphadenopathy: She has no cervical adenopathy.  Skin: Skin is warm and dry. She is not diaphoretic.  Psychiatric: She has a normal mood and affect. Her behavior is normal.     Lab Results  Component Value Date   WBC 5.1 01/01/2023   HGB 13.3 01/01/2023   HCT 40.3 01/01/2023   PLT 150.0 01/01/2023   GLUCOSE 78 12/12/2023   CHOL 184 01/01/2023   TRIG 49.0 01/01/2023   HDL 86.70 01/01/2023   LDLCALC 87 01/01/2023   ALT 20 12/12/2023   AST 23 12/12/2023   NA 139 12/12/2023   K 4.9  12/12/2023   CL 99 12/12/2023   CREATININE 0.62 12/12/2023   BUN 17 12/12/2023   CO2 25 12/12/2023   TSH 1.760 12/12/2023   HGBA1C 5.5 11/13/2022         Assessment & Plan:   Physical exam: Screening blood work  ordered Exercise   regular - walking   Weight  on low side - lost weight when her dad was very sick - trying to increase weight Substance abuse  none   Reviewed recommended immunizations.   Health Maintenance  Topic Date Due   Pneumococcal Vaccine: 50+ Years (1 of 1 - PCV) 01/01/2025 (Originally 11/01/2008)   DEXA SCAN  01/06/2025 (Originally 01/05/2022)   INFLUENZA VACCINE  01/03/2024   COVID-19 Vaccine (9 - 2024-25 season) 01/09/2024   Colonoscopy  04/08/2025   MAMMOGRAM  12/10/2025   DTaP/Tdap/Td (3 - Td or Tdap) 02/26/2027   Hepatitis C Screening  Completed   HIV Screening  Completed   Zoster Vaccines- Shingrix   Completed   Hepatitis B Vaccines  Aged Out   HPV VACCINES  Aged Out   Meningococcal B Vaccine  Aged Out          See Problem List for Assessment and Plan of chronic medical problems.

## 2024-01-01 NOTE — Patient Instructions (Addendum)
 Tetanus vaccine given   Blood work was ordered.       Medications changes include :   None    Return in about 1 year (around 01/01/2025) for Physical Exam.     Health Maintenance, Female Adopting a healthy lifestyle and getting preventive care are important in promoting health and wellness. Ask your health care provider about: The right schedule for you to have regular tests and exams. Things you can do on your own to prevent diseases and keep yourself healthy. What should I know about diet, weight, and exercise? Eat a healthy diet  Eat a diet that includes plenty of vegetables, fruits, low-fat dairy products, and lean protein. Do not eat a lot of foods that are high in solid fats, added sugars, or sodium. Maintain a healthy weight Body mass index (BMI) is used to identify weight problems. It estimates body fat based on height and weight. Your health care provider can help determine your BMI and help you achieve or maintain a healthy weight. Get regular exercise Get regular exercise. This is one of the most important things you can do for your health. Most adults should: Exercise for at least 150 minutes each week. The exercise should increase your heart rate and make you sweat (moderate-intensity exercise). Do strengthening exercises at least twice a week. This is in addition to the moderate-intensity exercise. Spend less time sitting. Even light physical activity can be beneficial. Watch cholesterol and blood lipids Have your blood tested for lipids and cholesterol at 65 years of age, then have this test every 5 years. Have your cholesterol levels checked more often if: Your lipid or cholesterol levels are high. You are older than 65 years of age. You are at high risk for heart disease. What should I know about cancer screening? Depending on your health history and family history, you may need to have cancer screening at various ages. This may include screening  for: Breast cancer. Cervical cancer. Colorectal cancer. Skin cancer. Lung cancer. What should I know about heart disease, diabetes, and high blood pressure? Blood pressure and heart disease High blood pressure causes heart disease and increases the risk of stroke. This is more likely to develop in people who have high blood pressure readings or are overweight. Have your blood pressure checked: Every 3-5 years if you are 69-42 years of age. Every year if you are 72 years old or older. Diabetes Have regular diabetes screenings. This checks your fasting blood sugar level. Have the screening done: Once every three years after age 3 if you are at a normal weight and have a low risk for diabetes. More often and at a younger age if you are overweight or have a high risk for diabetes. What should I know about preventing infection? Hepatitis B If you have a higher risk for hepatitis B, you should be screened for this virus. Talk with your health care provider to find out if you are at risk for hepatitis B infection. Hepatitis C Testing is recommended for: Everyone born from [birth year] through 1967. Anyone with known risk factors for hepatitis C. Sexually transmitted infections (STIs) Get screened for STIs, including gonorrhea and chlamydia, if: You are sexually active and are younger than 65 years of age. You are older than 65 years of age and your health care provider tells you that you are at risk for this type of infection. Your sexual activity has changed since you were last screened, and you are at increased risk for  chlamydia or gonorrhea. Ask your health care provider if you are at risk. Ask your health care provider about whether you are at high risk for HIV. Your health care provider may recommend a prescription medicine to help prevent HIV infection. If you choose to take medicine to prevent HIV, you should first get tested for HIV. You should then be tested every 3 months for as long as you  are taking the medicine. Pregnancy If you are about to stop having your period (premenopausal) and you may become pregnant, seek counseling before you get pregnant. Take 400 to 800 micrograms (mcg) of folic acid every day if you become pregnant. Ask for birth control (contraception) if you want to prevent pregnancy. Osteoporosis and menopause Osteoporosis is a disease in which the bones lose minerals and strength with aging. This can result in bone fractures. If you are 8 years old or older, or if you are at risk for osteoporosis and fractures, ask your health care provider if you should: Be screened for bone loss. Take a calcium or vitamin D supplement to lower your risk of fractures. Be given hormone replacement therapy (HRT) to treat symptoms of menopause. Follow these instructions at home: Alcohol use Do not drink alcohol if: Your health care provider tells you not to drink. You are pregnant, may be pregnant, or are planning to become pregnant. If you drink alcohol: Limit how much you have to: 0-1 drink a day. Know how much alcohol is in your drink. In the [place] one drink equals one 12 oz bottle of beer (355 mL), one 5 oz glass of wine (148 mL), or one 1 oz glass of hard liquor (44 mL). Lifestyle Do not use any products that contain nicotine or tobacco. These products include cigarettes, chewing tobacco, and vaping devices, such as e-cigarettes. If you need help quitting, ask your health care provider. Do not use street drugs. Do not share needles. Ask your health care provider for help if you need support or information about quitting drugs. General instructions Schedule regular health, dental, and eye exams. Stay current with your vaccines. Tell your health care provider if: You often feel depressed. You have ever been abused or do not feel safe at home. Summary Adopting a healthy lifestyle and getting preventive care are important in promoting health and wellness. Follow your  health care provider's instructions about healthy diet, exercising, and getting tested or screened for diseases. Follow your health care provider's instructions on monitoring your cholesterol and blood pressure. This information is not intended to replace advice given to you by your health care provider. Make sure you discuss any questions you have with your health care provider. Document Revised: 10/10/2020 Document Reviewed: 10/10/2020 Elsevier Patient Education  2024 [employer].

## 2024-01-02 ENCOUNTER — Encounter: Payer: Self-pay | Admitting: Internal Medicine

## 2024-01-02 ENCOUNTER — Ambulatory Visit (INDEPENDENT_AMBULATORY_CARE_PROVIDER_SITE_OTHER): Payer: 59 | Admitting: Internal Medicine

## 2024-01-02 VITALS — BP 100/78 | HR 70 | Temp 97.6°F | Ht 63.5 in | Wt 107.0 lb

## 2024-01-02 DIAGNOSIS — M810 Age-related osteoporosis without current pathological fracture: Secondary | ICD-10-CM | POA: Diagnosis not present

## 2024-01-02 DIAGNOSIS — Z0000 Encounter for general adult medical examination without abnormal findings: Secondary | ICD-10-CM | POA: Diagnosis not present

## 2024-01-02 DIAGNOSIS — E20819 Hypoparathyroidism due to impaired parathyroid hormone secretion, unspecified: Secondary | ICD-10-CM

## 2024-01-02 DIAGNOSIS — E559 Vitamin D deficiency, unspecified: Secondary | ICD-10-CM | POA: Diagnosis not present

## 2024-01-02 DIAGNOSIS — K219 Gastro-esophageal reflux disease without esophagitis: Secondary | ICD-10-CM | POA: Diagnosis not present

## 2024-01-02 DIAGNOSIS — D696 Thrombocytopenia, unspecified: Secondary | ICD-10-CM

## 2024-01-02 NOTE — Assessment & Plan Note (Signed)
 Chronic dexa up to date -  Monitored by Dr McComb On calcium and vitamin d  Continue regular exercise On estradiol  On forteo Check vitamin D  level

## 2024-01-02 NOTE — Assessment & Plan Note (Signed)
 History of thrombocytopenia Mild CBC

## 2024-01-02 NOTE — Assessment & Plan Note (Signed)
 Chronic Has seen endo and they are monitoring

## 2024-01-02 NOTE — Assessment & Plan Note (Signed)
 Chronic Taking vitamin D daily Check vitamin D level

## 2024-01-02 NOTE — Assessment & Plan Note (Signed)
Chronic Following with GI GERD controlled Continue Nexium 40 mg daily

## 2024-02-10 ENCOUNTER — Other Ambulatory Visit: Payer: Self-pay | Admitting: Internal Medicine

## 2024-03-11 MED ORDER — ESOMEPRAZOLE MAGNESIUM 40 MG PO CPDR: 40.0000 mg | DELAYED_RELEASE_CAPSULE | Freq: Every day | ORAL | 0 refills | Status: DC

## 2024-03-17 ENCOUNTER — Other Ambulatory Visit (HOSPITAL_COMMUNITY): Payer: Self-pay

## 2024-03-17 MED ORDER — FLUZONE HIGH-DOSE 0.5 ML IM SUSY
PREFILLED_SYRINGE | INTRAMUSCULAR | 0 refills | Status: DC
  Filled 2024-03-17: qty 0.5, 1d supply, fill #0

## 2024-04-14 ENCOUNTER — Ambulatory Visit: Admitting: Gastroenterology

## 2024-04-14 ENCOUNTER — Encounter: Payer: Self-pay | Admitting: Gastroenterology

## 2024-04-14 ENCOUNTER — Other Ambulatory Visit (HOSPITAL_COMMUNITY): Payer: Self-pay

## 2024-04-14 VITALS — BP 96/60 | HR 84 | Ht 62.5 in | Wt 110.1 lb

## 2024-04-14 DIAGNOSIS — K219 Gastro-esophageal reflux disease without esophagitis: Secondary | ICD-10-CM | POA: Diagnosis not present

## 2024-04-14 DIAGNOSIS — R1310 Dysphagia, unspecified: Secondary | ICD-10-CM

## 2024-04-14 DIAGNOSIS — K449 Diaphragmatic hernia without obstruction or gangrene: Secondary | ICD-10-CM

## 2024-04-14 MED ORDER — ESOMEPRAZOLE MAGNESIUM 40 MG PO CPDR
40.0000 mg | DELAYED_RELEASE_CAPSULE | Freq: Every day | ORAL | 3 refills | Status: DC
  Filled 2024-04-14: qty 30, 30d supply, fill #0

## 2024-04-14 MED ORDER — PREVNAR 20 0.5 ML IM SUSY
0.5000 mL | PREFILLED_SYRINGE | Freq: Once | INTRAMUSCULAR | 0 refills | Status: AC
  Filled 2024-04-14: qty 0.5, 1d supply, fill #0

## 2024-04-14 NOTE — Patient Instructions (Signed)
 We have sent the following medications to your pharmacy for you to pick up at your convenience:  Nexium   _______________________________________________________  If your blood pressure at your visit was 140/90 or greater, please contact your primary care physician to follow up on this.  _______________________________________________________  If you are age 65 or older, your body mass index should be between 23-30. Your Body mass index is 19.82 kg/m. If this is out of the aforementioned range listed, please consider follow up with your Primary Care Provider.  If you are age 74 or younger, your body mass index should be between 19-25. Your Body mass index is 19.82 kg/m. If this is out of the aformentioned range listed, please consider follow up with your Primary Care Provider.   ________________________________________________________  The [facility] providers would like to encourage you to use MYCHART to communicate with providers for non-urgent requests or questions.  Due to long hold times on the telephone, sending your provider a message by [facility] may be a faster and more efficient way to get a response.  Please allow 48 business hours for a response.  Please remember that this is for non-urgent requests.  _______________________________________________________  [facility] is using a team-based approach to care.  Your team is made up of your doctor and two to three APPS. Our APPS (Nurse Practitioners and Physician Assistants) work with your physician to ensure care continuity for you. They are fully qualified to address your health concerns and develop a treatment plan. They communicate directly with your gastroenterologist to care for you. Seeing the Advanced Practice Practitioners on your physician's team can help you by facilitating care more promptly, often allowing for earlier appointments, access to diagnostic testing, procedures, and other specialty referrals.

## 2024-04-14 NOTE — Progress Notes (Signed)
 Chief Complaint: Medication refill Primary GI MD: Dr. Abran  HPI: 63 year old female with medical history as listed below presents for evaluation of medication refill  History of GERD with EGD in 2016.  She is on Nexium  40 mg daily with adequate control.  She states over the last few months she has had a recurrence of dysphagia.  She reports she has had dilation in the past but upon review of last upper endoscopy which was done in 2016 with Dr. Rosalie appears there is a normal endoscopy other than a few gastric polyps.  She states she often has trouble swallowing solids and feels like foods get stuck in her throat but this is not often and occurs at random  Lately she has been indulging in dark chocolate before bed which has caused a flare of her GERD when she consumes this at bedtime  Due for repeat colonoscopy in November 2026   Recent echocardiogram March 2025 with EF 55 to 60%  Past Medical History:  Diagnosis Date   Arthritis    GERD (gastroesophageal reflux disease)    on meds   History of colon polyps 2006   adenomatous, no polyps in 2011   History of hiatal hernia    History of kidney stones    Low blood pressure    occ.   Migraines    decreased since menopause   Osteopenia    dexa 08/2010: -2.3 L fem   Osteoporosis    hx of -   PONV (postoperative nausea and vomiting)    only with Demerol   Skin cancer    Middle of chest   Urine incontinence     Past Surgical History:  Procedure Laterality Date   BREAST EXCISIONAL BIOPSY Bilateral    BREAST SURGERY     breast biopsy x's 2 (L) 92 and (R) 98   COLONOSCOPY  2016   at [facility] polyps/polyps/small hems   COLONOSCOPY WITH PROPOFOL  N/A 02/11/2015   Procedure: COLONOSCOPY WITH PROPOFOL ;  Surgeon: Oliva Rosalie, MD;  Location: [facility] ENDOSCOPY;  Service: Endoscopy;  Laterality: N/A;   ESOPHAGOGASTRODUODENOSCOPY N/A 02/11/2015   Procedure: ESOPHAGOGASTRODUODENOSCOPY (EGD);  Surgeon: Oliva Rosalie, MD;  Location: [facility]  ENDOSCOPY;  Service: Endoscopy;  Laterality: N/A;   Laser endometriosis     SEPTOPLASTY     SHOULDER ARTHROSCOPY WITH BICEPS TENDON REPAIR Left 10/14/2018   Procedure: SHOULDER ARTHROSCOPY BICEP TENODESIS, SHOULDER DECOMPRESSION  AND SHOULDER MANIPULATION;  Surgeon: Beverley Evalene BIRCH, MD;  Location: [facility] [place];  Service: Orthopedics;  Laterality: Left;  REGIONAL BLOCK   TUBAL LIGATION     VAGINAL HYSTERECTOMY  2006    Current Outpatient Medications  Medication Sig Dispense Refill   acetaminophen  (TYLENOL ) 500 MG tablet Take 500 mg by mouth every 6 (six) hours as needed.     Ascorbic Acid (VITAMIN C) 1000 MG tablet Take 1,000 mg by mouth daily.     Calcium Citrate-Vitamin D  (CALCIUM CITRATE +) 315-5 MG-MCG TABS      Cholecalciferol (VITAMIN D3) 2000 UNITS TABS Take by mouth daily.       estradiol  (VIVELLE -DOT) 0.0375 MG/24HR Place 1 patch onto the skin 2 (two) times a week. 24 patch 4   FIBER SELECT GUMMIES PO Take 2 tablets by mouth at bedtime.     ibuprofen  (ADVIL ) 400 MG tablet Take 1 tablet (400 mg total) by mouth every 4 (four) hours for pain. 30 tablet 0   Multiple Vitamin (MULTIVITAMIN) tablet Take 1 tablet by mouth daily.  Omega-3 Fatty Acids (FISH OIL ULTRA) 1400 MG CAPS Take 1 tablet by mouth daily.     Teriparatide 560 MCG/2.24ML SOPN INJECT 20 MCG UNDER THE SKIN DAILY FOR OSTEOPOROSIS (DISCARD 28 DAYS AFTER INITIAL USE)     tretinoin (RETIN-A) 0.05 % cream Apply topically at bedtime.      esomeprazole  (NEXIUM ) 40 MG capsule Take 1 capsule (40 mg total) by mouth daily. 30 capsule 3   No current facility-administered medications for this visit.    Allergies as of 04/14/2024 - Review Complete 04/14/2024  Allergen Reaction Noted   Demerol  07/04/2011   Percocet [oxycodone-acetaminophen ]  07/04/2011   Sulfa antibiotics  07/04/2011    Family History  Problem Relation Age of Onset   Arthritis Mother    Diverticulitis Mother    Colon polyps Mother     Arthritis Father    COPD Father    Atrial fibrillation Father    Heart disease Father    Colon polyps Father    Diverticulitis Father    Breast cancer Sister 92   Kidney cancer Sister 53   Colon polyps Sister    Diverticulitis Sister 32       s/p colectomy   Colon polyps Sister    Diverticulitis Brother 32   Colon polyps Brother    Pancreatic cancer Paternal Uncle 7   Stomach cancer Paternal Uncle 2   Diabetes Maternal Grandfather    Heart disease Maternal Grandfather    Hyperlipidemia Maternal Grandfather    Stroke Other    Ovarian cancer Other 70       mat great aunt through [facility] with ovarian   Colon cancer Neg Hx    Esophageal cancer Neg Hx    Rectal cancer Neg Hx     Social History   Socioeconomic History   Marital status: Married    Spouse name: Not on file   Number of children: 0   Years of education: Not on file   Highest education level: Associate degree: occupational, scientist, product/process development, or vocational program  Occupational History   Occupation: nurse  Tobacco Use   Smoking status: Never   Smokeless tobacco: Never  Vaping Use   Vaping status: Never Used  Substance and Sexual Activity   Alcohol use: No   Drug use: No   Sexual activity: Yes    Partners: Male    Birth control/protection: Post-menopausal, Surgical    Comment: married  Other Topics Concern   Not on file  Social History Narrative   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[facility] -   Married, lives with spouse - no children   Social Drivers of Corporate Investment Banker Strain: Low Risk  (08/16/2023)   Overall Financial Resource Strain (CARDIA)    Difficulty of Paying Living Expenses: Not hard at all  Food Insecurity: No Food Insecurity (08/16/2023)   Hunger Vital Sign    Worried About Running Out of Food in the Last Year: Never true    Ran Out of Food in the Last Year: Never true  Transportation Needs: No Transportation Needs (08/16/2023)   PRAPARE - Administrator, Civil Service (Medical): No    Lack of  Transportation (Non-Medical): No  Physical Activity: Sufficiently Active (08/16/2023)   Exercise Vital Sign    Days of Exercise per Week: 6 days    Minutes of Exercise per Session: 150+ min  Stress: No Stress Concern Present (08/16/2023)   [employer] of Occupational Health - Occupational Stress Questionnaire  Feeling of Stress : Only a little  Social Connections: Socially Integrated (08/16/2023)   Social Connection and Isolation Panel    Frequency of Communication with Friends and Family: More than three times a week    Frequency of Social Gatherings with Friends and Family: Twice a week    Attends Religious Services: More than 4 times per year    Active Member of [employer] or Organizations: Yes    Attends Banker Meetings: 1 to 4 times per year    Marital Status: Married  Catering Manager Violence: Not on file    Review of Systems:    Constitutional: No weight loss, fever, chills, weakness or fatigue HEENT: Eyes: No change in vision               Ears, Nose, Throat:  No change in hearing or congestion Skin: No rash or itching Cardiovascular: No chest pain, chest pressure or palpitations   Respiratory: No SOB or cough Gastrointestinal: See HPI and otherwise negative Genitourinary: No dysuria or change in urinary frequency Neurological: No headache, dizziness or syncope Musculoskeletal: No new muscle or joint pain Hematologic: No bleeding or bruising Psychiatric: No history of depression or anxiety    Physical Exam:  Vital signs: BP 96/60 (BP Location: Left Arm, Patient Position: Sitting, Cuff Size: Normal)   Pulse 84   Ht 5' 2.5 (1.588 m) Comment: height measured without shoes  Wt 110 lb 2 oz (50 kg)   BMI 19.82 kg/m   Constitutional: NAD, alert and cooperative Head:  Normocephalic and atraumatic. Eyes:   PEERL, EOMI. No icterus. Conjunctiva pink. Respiratory: Respirations even and unlabored. Lungs clear to auscultation bilaterally.   No wheezes,  crackles, or rhonchi.  Cardiovascular:  Regular rate and rhythm. No peripheral edema, cyanosis or pallor.  Gastrointestinal:  Soft, nondistended, nontender. No rebound or guarding. Normal bowel sounds. No appreciable masses or hepatomegaly. Rectal:  Declines Msk:  Symmetrical without gross deformities. Without edema, no deformity or joint abnormality.  Neurologic:  Alert and  oriented x4;  grossly normal neurologically.  Skin:   Dry and intact without significant lesions or rashes. Psychiatric: Oriented to person, place and time. Demonstrates good judgement and reason without abnormal affect or behaviors.  RELEVANT LABS AND IMAGING: CBC    Component Value Date/Time   WBC 5.1 01/01/2023 0832   RBC 4.46 01/01/2023 0832   HGB 13.3 01/01/2023 0832   HCT 40.3 01/01/2023 0832   PLT 150.0 01/01/2023 0832   MCV 90.4 01/01/2023 0832   MCH 30.6 12/14/2019 0830   MCHC 32.9 01/01/2023 0832   RDW 13.6 01/01/2023 0832   LYMPHSABS 1.6 01/01/2023 0832   MONOABS 0.4 01/01/2023 0832   EOSABS 0.1 01/01/2023 0832   BASOSABS 0.0 01/01/2023 0832    CMP     Component Value Date/Time   NA 139 12/12/2023 1423   K 4.9 12/12/2023 1423   CL 99 12/12/2023 1423   CO2 25 12/12/2023 1423   GLUCOSE 78 12/12/2023 1423   GLUCOSE 88 01/01/2023 0832   BUN 17 12/12/2023 1423   CREATININE 0.62 12/12/2023 1423   CREATININE 0.69 12/14/2019 0830   CALCIUM 10.0 12/12/2023 1423   PROT 6.6 12/12/2023 1423   ALBUMIN 4.4 12/12/2023 1423   AST 23 12/12/2023 1423   ALT 20 12/12/2023 1423   ALKPHOS 101 12/12/2023 1423   BILITOT 0.7 12/12/2023 1423     Assessment/Plan:   Chronic GERD Dysphagia EGD in 2016 with small hiatal hernia, few gastric polyps, otherwise  normal exam.  Usually well-controlled on Nexium  40 mg once daily now having breakthrough symptoms secondary to chocolate at bedtime.  Also having rare dysphagia to solids.  Possible globus sensation. - Educated patient on lifestyle modifications provided  patient education handout - Increase Nexium  to twice daily - Follow-up in 8 to 12 weeks, if persistent symptoms can pursue EGD with possible dilation  Family history of adenomatous polyps in multiple first-degree relatives less than age 25 Colonoscopy in November 2021 with hyperplastic polyp.  Due for 5-year recall as she has multiple first-degree relatives with adenomatous colon polyps less than age 44 - Due for repeat 04/2025   Nestor Blower, PA-C [facility] 04/14/2024, 11:25 AM  Cc: Geofm Glade PARAS, MD

## 2024-04-14 NOTE — Progress Notes (Signed)
 Noted

## 2024-05-22 ENCOUNTER — Other Ambulatory Visit: Payer: Self-pay | Admitting: Internal Medicine

## 2024-06-02 ENCOUNTER — Other Ambulatory Visit (HOSPITAL_COMMUNITY): Payer: Self-pay

## 2024-06-02 MED ORDER — COVID-19 MRNA VAC-TRIS(PFIZER) 30 MCG/0.3ML IM SUSY
0.3000 mL | PREFILLED_SYRINGE | Freq: Once | INTRAMUSCULAR | 0 refills | Status: AC
  Filled 2024-06-02: qty 0.3, 1d supply, fill #0

## 2024-06-16 ENCOUNTER — Ambulatory Visit: Admitting: Gastroenterology

## 2024-06-17 ENCOUNTER — Ambulatory Visit: Admitting: Gastroenterology

## 2024-07-08 ENCOUNTER — Ambulatory Visit: Admitting: Gastroenterology

## 2024-07-08 ENCOUNTER — Encounter: Payer: Self-pay | Admitting: Gastroenterology

## 2024-07-08 VITALS — BP 119/70 | HR 78 | Ht 62.5 in | Wt 112.0 lb

## 2024-07-08 DIAGNOSIS — K219 Gastro-esophageal reflux disease without esophagitis: Secondary | ICD-10-CM

## 2024-07-08 DIAGNOSIS — R09A2 Foreign body sensation, throat: Secondary | ICD-10-CM | POA: Diagnosis not present

## 2024-07-08 DIAGNOSIS — R1310 Dysphagia, unspecified: Secondary | ICD-10-CM | POA: Diagnosis not present

## 2024-07-08 DIAGNOSIS — Z860101 Personal history of adenomatous and serrated colon polyps: Secondary | ICD-10-CM | POA: Diagnosis not present

## 2024-07-08 DIAGNOSIS — K2100 Gastro-esophageal reflux disease with esophagitis, without bleeding: Secondary | ICD-10-CM

## 2024-07-08 DIAGNOSIS — K449 Diaphragmatic hernia without obstruction or gangrene: Secondary | ICD-10-CM

## 2024-07-08 NOTE — Progress Notes (Signed)
 Noted.

## 2024-07-08 NOTE — Patient Instructions (Addendum)
 Reflux Gourmet Rescue  It is an ALGINATE THERAPY which is the only intervention that works to safeguard the esophagus by creating a protective barrier that actually stops reflux from happening. -The general directions for use are as stated on the packaging: Take 1 teaspoon (5 ml), or more as needed or as directed by your physician, after meals and before bed. -These general directions address the most common times for reflux to occur, but our Rescue products may be taken anytime. Some individuals may take our product preemptively, when they know they will suffer from reflux, or as needed - when discomfort arises. (If taken around food, it should be consumed last.) -You do not have to take 1 teaspoon (5 ml) of the product. While one teaspoon (5ml) may be the perfect average amount to relieve reflux suffering in some, others may require more or less. You may adjust the amount of Mint Chocolate Rescue and Vanilla Caramel Rescue to the lowest amount necessary to meet your individual needs to improve your quality of life. -You may dilute the product if it is too viscous for you to consume. Keep in mind, however, that the thickness of the product was formulated to provide optimal coating and protection of your throat and esophagus. Though diluting the product is possible, it may reduce the protective function and/or length of action. -This can be used in conjunction with reflux medications and lifestyle changes.  100% ALL-NATURAL  Paraben FREE, glycerin FREE, & potassium FREE  Made entirely from all-natural ingredients considered safe for children and during pregnancy  No known side effects  All-natural flavor Gluten FREE  Allergen FREE  Vegan  Can find more information here: nameseizer.co.nz  You have been scheduled for a Barium Esophogram at [facility] (1st floor of the hospital) on 08/05/2024 at 10 am. Please arrive 30 minutes prior to your appointment  for registration. Make certain not to have anything to eat or drink 3 hours prior to your test. If you need to reschedule for any reason, please contact radiology at [phone] to do so. __________________________________________________________________ A barium swallow is an examination that concentrates on views of the esophagus. This tends to be a double contrast exam (barium and two liquids which, when combined, create a gas to distend the wall of the oesophagus) or single contrast (non-ionic iodine based). The study is usually tailored to your symptoms so a good history is essential. Attention is paid during the study to the form, structure and configuration of the esophagus, looking for functional disorders (such as aspiration, dysphagia, achalasia, motility and reflux) EXAMINATION You may be asked to change into a gown, depending on the type of swallow being performed. A radiologist and radiographer will perform the procedure. The radiologist will advise you of the type of contrast selected for your procedure and direct you during the exam. You will be asked to stand, sit or lie in several different positions and to hold a small amount of fluid in your mouth before being asked to swallow while the imaging is performed .In some instances you may be asked to swallow barium coated marshmallows to assess the motility of a solid food bolus. The exam can be recorded as a digital or video fluoroscopy procedure. POST PROCEDURE It will take 1-2 days for the barium to pass through your system. To facilitate this, it is important, unless otherwise directed, to increase your fluids for the next 24-48hrs and to resume your normal diet.  This test typically takes about 30 minutes to  perform. __________________________________________________________________________________   Due to recent changes in healthcare laws, you may see the results of your imaging and laboratory studies on MyChart before your  provider has had a chance to review them.  We understand that in some cases there may be results that are confusing or concerning to you. Not all laboratory results come back in the same time frame and the provider may be waiting for multiple results in order to interpret others.  Please give us  48 hours in order for your provider to thoroughly review all the results before contacting the office for clarification of your results.   I appreciate the  opportunity to care for you  Thank You   [facility]

## 2024-08-05 ENCOUNTER — Other Ambulatory Visit (HOSPITAL_COMMUNITY)

## 2025-01-06 ENCOUNTER — Encounter: Admitting: Internal Medicine
